# Patient Record
Sex: Female | Born: 1975
Health system: Southern US, Community
[De-identification: ages and names within clinical notes are randomized; demographics above are authoritative.]

## PROBLEM LIST (undated history)

## (undated) ENCOUNTER — Inpatient Hospital Stay (HOSPITAL_COMMUNITY): Payer: Self-pay

## (undated) DIAGNOSIS — O10919 Unspecified pre-existing hypertension complicating pregnancy, unspecified trimester: Secondary | ICD-10-CM

## (undated) DIAGNOSIS — M1990 Unspecified osteoarthritis, unspecified site: Secondary | ICD-10-CM

## (undated) DIAGNOSIS — I10 Essential (primary) hypertension: Secondary | ICD-10-CM

## (undated) DIAGNOSIS — G510 Bell's palsy: Secondary | ICD-10-CM

## (undated) DIAGNOSIS — E282 Polycystic ovarian syndrome: Secondary | ICD-10-CM

## (undated) DIAGNOSIS — A048 Other specified bacterial intestinal infections: Secondary | ICD-10-CM

## (undated) DIAGNOSIS — K429 Umbilical hernia without obstruction or gangrene: Secondary | ICD-10-CM

## (undated) HISTORY — DX: Other specified bacterial intestinal infections: A04.8

## (undated) HISTORY — DX: Polycystic ovarian syndrome: E28.2

## (undated) HISTORY — PX: TONSILLECTOMY AND ADENOIDECTOMY: SUR1326

## (undated) HISTORY — PX: CHOLECYSTECTOMY: SHX55

## (undated) HISTORY — DX: Unspecified pre-existing hypertension complicating pregnancy, unspecified trimester: O10.919

## (undated) HISTORY — DX: Unspecified osteoarthritis, unspecified site: M19.90

## (undated) HISTORY — DX: Essential (primary) hypertension: I10

## (undated) HISTORY — DX: Umbilical hernia without obstruction or gangrene: K42.9

---
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
## 1997-09-05 ENCOUNTER — Emergency Department (HOSPITAL_COMMUNITY): Admission: EM | Admit: 1997-09-05 | Discharge: 1997-09-05 | Payer: Self-pay | Admitting: Emergency Medicine

## 1998-01-13 ENCOUNTER — Emergency Department (HOSPITAL_COMMUNITY): Admission: EM | Admit: 1998-01-13 | Discharge: 1998-01-13 | Payer: Self-pay | Admitting: Emergency Medicine

## 1998-06-26 ENCOUNTER — Other Ambulatory Visit: Admission: RE | Admit: 1998-06-26 | Discharge: 1998-06-26 | Payer: Self-pay | Admitting: Obstetrics

## 1998-06-28 ENCOUNTER — Other Ambulatory Visit: Admission: RE | Admit: 1998-06-28 | Discharge: 1998-06-28 | Payer: Self-pay | Admitting: Obstetrics

## 1998-07-11 ENCOUNTER — Emergency Department (HOSPITAL_COMMUNITY): Admission: EM | Admit: 1998-07-11 | Discharge: 1998-07-11 | Payer: Self-pay | Admitting: Emergency Medicine

## 1998-07-11 ENCOUNTER — Encounter: Payer: Self-pay | Admitting: Emergency Medicine

## 1999-04-19 ENCOUNTER — Other Ambulatory Visit: Admission: RE | Admit: 1999-04-19 | Discharge: 1999-04-19 | Payer: Self-pay | Admitting: Obstetrics

## 1999-04-19 ENCOUNTER — Encounter (INDEPENDENT_AMBULATORY_CARE_PROVIDER_SITE_OTHER): Payer: Self-pay | Admitting: Specialist

## 1999-06-06 ENCOUNTER — Encounter: Payer: Self-pay | Admitting: Emergency Medicine

## 1999-06-06 ENCOUNTER — Emergency Department (HOSPITAL_COMMUNITY): Admission: EM | Admit: 1999-06-06 | Discharge: 1999-06-06 | Payer: Self-pay | Admitting: Emergency Medicine

## 2000-08-06 ENCOUNTER — Emergency Department (HOSPITAL_COMMUNITY): Admission: EM | Admit: 2000-08-06 | Discharge: 2000-08-06 | Payer: Self-pay | Admitting: Emergency Medicine

## 2000-08-06 ENCOUNTER — Encounter: Payer: Self-pay | Admitting: Emergency Medicine

## 2001-03-13 ENCOUNTER — Emergency Department (HOSPITAL_COMMUNITY): Admission: EM | Admit: 2001-03-13 | Discharge: 2001-03-13 | Payer: Self-pay | Admitting: Emergency Medicine

## 2002-12-15 ENCOUNTER — Emergency Department (HOSPITAL_COMMUNITY): Admission: EM | Admit: 2002-12-15 | Discharge: 2002-12-15 | Payer: Self-pay | Admitting: Emergency Medicine

## 2003-01-27 ENCOUNTER — Emergency Department (HOSPITAL_COMMUNITY): Admission: EM | Admit: 2003-01-27 | Discharge: 2003-01-27 | Payer: Self-pay | Admitting: Emergency Medicine

## 2003-04-01 ENCOUNTER — Ambulatory Visit (HOSPITAL_COMMUNITY): Admission: RE | Admit: 2003-04-01 | Discharge: 2003-04-01 | Payer: Self-pay | Admitting: *Deleted

## 2003-04-23 ENCOUNTER — Ambulatory Visit (HOSPITAL_COMMUNITY): Admission: RE | Admit: 2003-04-23 | Discharge: 2003-04-23 | Payer: Self-pay | Admitting: *Deleted

## 2003-06-04 ENCOUNTER — Inpatient Hospital Stay (HOSPITAL_COMMUNITY): Admission: AD | Admit: 2003-06-04 | Discharge: 2003-06-05 | Payer: Self-pay | Admitting: *Deleted

## 2003-07-03 ENCOUNTER — Observation Stay (HOSPITAL_COMMUNITY): Admission: AD | Admit: 2003-07-03 | Discharge: 2003-07-04 | Payer: Self-pay | Admitting: Family Medicine

## 2003-08-09 ENCOUNTER — Inpatient Hospital Stay (HOSPITAL_COMMUNITY): Admission: AD | Admit: 2003-08-09 | Discharge: 2003-08-09 | Payer: Self-pay | Admitting: *Deleted

## 2003-08-13 ENCOUNTER — Inpatient Hospital Stay (HOSPITAL_COMMUNITY): Admission: AD | Admit: 2003-08-13 | Discharge: 2003-08-13 | Payer: Self-pay | Admitting: Obstetrics and Gynecology

## 2003-08-24 ENCOUNTER — Inpatient Hospital Stay (HOSPITAL_COMMUNITY): Admission: AD | Admit: 2003-08-24 | Discharge: 2003-08-24 | Payer: Self-pay | Admitting: Family Medicine

## 2003-09-01 ENCOUNTER — Inpatient Hospital Stay (HOSPITAL_COMMUNITY): Admission: AD | Admit: 2003-09-01 | Discharge: 2003-09-01 | Payer: Self-pay | Admitting: Obstetrics & Gynecology

## 2003-09-04 ENCOUNTER — Encounter: Admission: RE | Admit: 2003-09-04 | Discharge: 2003-09-04 | Payer: Self-pay | Admitting: *Deleted

## 2003-09-05 ENCOUNTER — Encounter: Admission: RE | Admit: 2003-09-05 | Discharge: 2003-09-05 | Payer: Self-pay | Admitting: *Deleted

## 2003-09-07 ENCOUNTER — Inpatient Hospital Stay (HOSPITAL_COMMUNITY): Admission: AD | Admit: 2003-09-07 | Discharge: 2003-09-07 | Payer: Self-pay | Admitting: Family Medicine

## 2003-09-08 ENCOUNTER — Inpatient Hospital Stay (HOSPITAL_COMMUNITY): Admission: AD | Admit: 2003-09-08 | Discharge: 2003-09-08 | Payer: Self-pay | Admitting: Obstetrics & Gynecology

## 2003-09-11 ENCOUNTER — Inpatient Hospital Stay (HOSPITAL_COMMUNITY): Admission: AD | Admit: 2003-09-11 | Discharge: 2003-09-13 | Payer: Self-pay | Admitting: Obstetrics and Gynecology

## 2004-12-10 ENCOUNTER — Ambulatory Visit: Payer: Self-pay | Admitting: Nurse Practitioner

## 2004-12-14 ENCOUNTER — Ambulatory Visit: Payer: Self-pay | Admitting: *Deleted

## 2004-12-21 ENCOUNTER — Ambulatory Visit: Payer: Self-pay | Admitting: Nurse Practitioner

## 2005-01-07 ENCOUNTER — Ambulatory Visit: Payer: Self-pay | Admitting: Nurse Practitioner

## 2005-09-19 ENCOUNTER — Ambulatory Visit: Payer: Self-pay | Admitting: Nurse Practitioner

## 2005-10-24 ENCOUNTER — Ambulatory Visit: Payer: Self-pay | Admitting: *Deleted

## 2006-12-30 ENCOUNTER — Emergency Department (HOSPITAL_COMMUNITY): Admission: EM | Admit: 2006-12-30 | Discharge: 2006-12-30 | Payer: Self-pay | Admitting: Emergency Medicine

## 2007-03-22 ENCOUNTER — Emergency Department (HOSPITAL_COMMUNITY): Admission: EM | Admit: 2007-03-22 | Discharge: 2007-03-22 | Payer: Self-pay | Admitting: Emergency Medicine

## 2007-05-11 ENCOUNTER — Ambulatory Visit: Payer: Self-pay | Admitting: Nurse Practitioner

## 2007-05-14 ENCOUNTER — Ambulatory Visit (HOSPITAL_COMMUNITY): Admission: RE | Admit: 2007-05-14 | Discharge: 2007-05-14 | Payer: Self-pay | Admitting: Nurse Practitioner

## 2007-07-05 ENCOUNTER — Ambulatory Visit: Payer: Self-pay | Admitting: Family Medicine

## 2007-07-12 ENCOUNTER — Ambulatory Visit (HOSPITAL_COMMUNITY): Admission: RE | Admit: 2007-07-12 | Discharge: 2007-07-12 | Payer: Self-pay | Admitting: Family Medicine

## 2007-08-09 ENCOUNTER — Ambulatory Visit: Payer: Self-pay | Admitting: Family Medicine

## 2007-08-31 ENCOUNTER — Ambulatory Visit: Payer: Self-pay | Admitting: Internal Medicine

## 2007-09-03 ENCOUNTER — Ambulatory Visit: Payer: Self-pay | Admitting: Internal Medicine

## 2008-02-09 ENCOUNTER — Emergency Department (HOSPITAL_COMMUNITY): Admission: EM | Admit: 2008-02-09 | Discharge: 2008-02-09 | Payer: Self-pay | Admitting: Emergency Medicine

## 2008-09-18 ENCOUNTER — Emergency Department (HOSPITAL_COMMUNITY): Admission: EM | Admit: 2008-09-18 | Discharge: 2008-09-18 | Payer: Self-pay | Admitting: Emergency Medicine

## 2008-09-22 ENCOUNTER — Emergency Department (HOSPITAL_COMMUNITY): Admission: EM | Admit: 2008-09-22 | Discharge: 2008-09-22 | Payer: Self-pay | Admitting: Emergency Medicine

## 2008-09-24 ENCOUNTER — Emergency Department (HOSPITAL_COMMUNITY): Admission: EM | Admit: 2008-09-24 | Discharge: 2008-09-24 | Payer: Self-pay | Admitting: Family Medicine

## 2008-11-15 ENCOUNTER — Emergency Department (HOSPITAL_COMMUNITY): Admission: EM | Admit: 2008-11-15 | Discharge: 2008-11-15 | Payer: Self-pay | Admitting: Family Medicine

## 2008-11-23 ENCOUNTER — Emergency Department (HOSPITAL_COMMUNITY): Admission: EM | Admit: 2008-11-23 | Discharge: 2008-11-23 | Payer: Self-pay | Admitting: Emergency Medicine

## 2009-04-09 ENCOUNTER — Emergency Department (HOSPITAL_COMMUNITY): Admission: EM | Admit: 2009-04-09 | Discharge: 2009-04-09 | Payer: Self-pay | Admitting: Family Medicine

## 2010-03-12 ENCOUNTER — Inpatient Hospital Stay (HOSPITAL_COMMUNITY): Admission: AD | Admit: 2010-03-12 | Discharge: 2010-03-13 | Payer: Self-pay | Admitting: Obstetrics and Gynecology

## 2010-03-31 ENCOUNTER — Encounter
Admission: RE | Admit: 2010-03-31 | Discharge: 2010-06-08 | Payer: Self-pay | Source: Home / Self Care | Attending: Obstetrics and Gynecology | Admitting: Obstetrics and Gynecology

## 2010-04-19 ENCOUNTER — Emergency Department (HOSPITAL_COMMUNITY)
Admission: EM | Admit: 2010-04-19 | Discharge: 2010-04-19 | Payer: Self-pay | Source: Home / Self Care | Admitting: Family Medicine

## 2010-04-19 ENCOUNTER — Inpatient Hospital Stay (HOSPITAL_COMMUNITY)
Admission: AD | Admit: 2010-04-19 | Discharge: 2010-04-20 | Payer: Self-pay | Source: Home / Self Care | Attending: Obstetrics and Gynecology | Admitting: Obstetrics and Gynecology

## 2010-07-13 ENCOUNTER — Inpatient Hospital Stay (HOSPITAL_COMMUNITY)
Admission: AD | Admit: 2010-07-13 | Discharge: 2010-07-13 | Disposition: A | Discharge status: Home / Self Care | Payer: Medicaid Other | Source: Ambulatory Visit | Attending: Obstetrics and Gynecology | Admitting: Obstetrics and Gynecology

## 2010-07-13 DIAGNOSIS — O4700 False labor before 37 completed weeks of gestation, unspecified trimester: Secondary | ICD-10-CM | POA: Insufficient documentation

## 2010-07-13 LAB — CBC
HCT: 35.6 % — ABNORMAL LOW (ref 36.0–46.0)
Hemoglobin: 11.3 g/dL — ABNORMAL LOW (ref 12.0–15.0)
MCH: 27.9 pg (ref 26.0–34.0)
MCHC: 31.7 g/dL (ref 30.0–36.0)
MCV: 87.9 fL (ref 78.0–100.0)

## 2010-07-13 LAB — URIC ACID: Uric Acid, Serum: 4.3 mg/dL (ref 2.4–7.0)

## 2010-07-13 LAB — LACTATE DEHYDROGENASE: LDH: 105 U/L (ref 94–250)

## 2010-07-13 LAB — COMPREHENSIVE METABOLIC PANEL
ALT: 19 U/L (ref 0–35)
BUN: 7 mg/dL (ref 6–23)
CO2: 22 mEq/L (ref 19–32)
Calcium: 8.8 mg/dL (ref 8.4–10.5)
GFR calc non Af Amer: >60 mL/min (ref >60)
Glucose, Bld: 88 mg/dL (ref 70–99)
Sodium: 132 mEq/L — ABNORMAL LOW (ref 135–145)

## 2010-07-15 LAB — STREP B DNA PROBE

## 2010-07-17 ENCOUNTER — Inpatient Hospital Stay (HOSPITAL_COMMUNITY)
Admission: AD | Admit: 2010-07-17 | Discharge: 2010-07-17 | Disposition: A | Discharge status: Home / Self Care | Payer: Medicaid Other | Source: Ambulatory Visit | Attending: Obstetrics and Gynecology | Admitting: Obstetrics and Gynecology

## 2010-07-17 DIAGNOSIS — O4700 False labor before 37 completed weeks of gestation, unspecified trimester: Secondary | ICD-10-CM | POA: Insufficient documentation

## 2010-07-17 LAB — URINALYSIS, ROUTINE W REFLEX MICROSCOPIC
Glucose, UA: NEGATIVE mg/dL
Protein, ur: NEGATIVE mg/dL
Specific Gravity, Urine: 1.025 (ref 1.005–1.030)

## 2010-07-17 LAB — URINE MICROSCOPIC-ADD ON

## 2010-07-19 ENCOUNTER — Inpatient Hospital Stay (HOSPITAL_COMMUNITY)
Admission: AD | Admit: 2010-07-19 | Discharge: 2010-07-21 | DRG: 781 | Disposition: A | Discharge status: Home / Self Care | Payer: Medicaid Other | Source: Ambulatory Visit | Attending: Obstetrics and Gynecology | Admitting: Obstetrics and Gynecology

## 2010-07-19 DIAGNOSIS — O4700 False labor before 37 completed weeks of gestation, unspecified trimester: Secondary | ICD-10-CM | POA: Diagnosis present

## 2010-07-19 DIAGNOSIS — O139 Gestational [pregnancy-induced] hypertension without significant proteinuria, unspecified trimester: Principal | ICD-10-CM | POA: Diagnosis present

## 2010-07-19 DIAGNOSIS — O99810 Abnormal glucose complicating pregnancy: Secondary | ICD-10-CM | POA: Diagnosis present

## 2010-07-19 LAB — CBC
HCT: 34.2 % — ABNORMAL LOW (ref 36.0–46.0)
Hemoglobin: 10.8 g/dL — ABNORMAL LOW (ref 12.0–15.0)
MCH: 27.8 pg (ref 26.0–34.0)
MCH: 29.5 pg (ref 26.0–34.0)
MCHC: 33.1 g/dL (ref 30.0–36.0)
MCV: 87.9 fL (ref 78.0–100.0)
RBC: 3.88 MIL/uL (ref 3.87–5.11)
RDW: 15.4 % (ref 11.5–15.5)

## 2010-07-19 LAB — GLUCOSE, CAPILLARY
Glucose-Capillary: 156 mg/dL — ABNORMAL HIGH (ref 70–99)
Glucose-Capillary: 63 mg/dL — ABNORMAL LOW (ref 70–99)
Glucose-Capillary: 90 mg/dL (ref 70–99)

## 2010-07-19 LAB — COMPREHENSIVE METABOLIC PANEL
AST: 16 U/L (ref 0–37)
BUN: 4 mg/dL — ABNORMAL LOW (ref 6–23)
CO2: 22 mEq/L (ref 19–32)
Chloride: 104 mEq/L (ref 96–112)
Creatinine, Ser: 0.48 mg/dL (ref 0.4–1.2)
GFR calc Af Amer: >60 mL/min (ref >60)
GFR calc non Af Amer: >60 mL/min (ref >60)
Total Bilirubin: 0.5 mg/dL (ref 0.3–1.2)

## 2010-07-19 LAB — URINALYSIS, ROUTINE W REFLEX MICROSCOPIC
Bilirubin Urine: NEGATIVE
Ketones, ur: NEGATIVE mg/dL
Protein, ur: NEGATIVE mg/dL
Urobilinogen, UA: 0.2 mg/dL (ref 0.0–1.0)

## 2010-07-19 LAB — DIFFERENTIAL
Basophils Absolute: 0.1 10*3/uL (ref 0.0–0.1)
Basophils Relative: 0 % (ref 0–1)
Eosinophils Absolute: 0 10*3/uL (ref 0.0–0.7)
Eosinophils Relative: 0 % (ref 0–5)
Monocytes Absolute: 0.3 10*3/uL (ref 0.1–1.0)
Neutro Abs: 13.2 10*3/uL — ABNORMAL HIGH (ref 1.7–7.7)

## 2010-07-20 LAB — LACTATE DEHYDROGENASE
LDH: 86 U/L — ABNORMAL LOW (ref 94–250)
LDH: 95 U/L (ref 94–250)

## 2010-07-20 LAB — COMPREHENSIVE METABOLIC PANEL
ALT: 16 U/L (ref 0–35)
ALT: 19 U/L (ref 0–35)
AST: 21 U/L (ref 0–37)
BUN: 5 mg/dL — ABNORMAL LOW (ref 6–23)
CO2: 23 mEq/L (ref 19–32)
CO2: 23 mEq/L (ref 19–32)
Calcium: 8.5 mg/dL (ref 8.4–10.5)
Calcium: 8.8 mg/dL (ref 8.4–10.5)
Creatinine, Ser: 0.44 mg/dL (ref 0.4–1.2)
Creatinine, Ser: 0.49 mg/dL (ref 0.4–1.2)
GFR calc Af Amer: >60 mL/min (ref >60)
GFR calc non Af Amer: >60 mL/min (ref >60)
GFR calc non Af Amer: >60 mL/min (ref >60)
Glucose, Bld: 59 mg/dL — ABNORMAL LOW (ref 70–99)
Sodium: 135 mEq/L (ref 135–145)
Sodium: 133 mEq/L — ABNORMAL LOW (ref 135–145)
Total Protein: 5.9 g/dL — ABNORMAL LOW (ref 6.0–8.3)
Total Protein: 6.9 g/dL (ref 6.0–8.3)

## 2010-07-20 LAB — CBC
HCT: 33.4 % — ABNORMAL LOW (ref 36.0–46.0)
MCH: 27.2 pg (ref 26.0–34.0)
MCH: 29.8 pg (ref 26.0–34.0)
MCHC: 30.8 g/dL (ref 30.0–36.0)
MCHC: 33.6 g/dL (ref 30.0–36.0)
Platelets: 342 10*3/uL (ref 150–400)
RDW: 15.6 % — ABNORMAL HIGH (ref 11.5–15.5)
RDW: 14.6 % (ref 11.5–15.5)

## 2010-07-20 LAB — URIC ACID
Uric Acid, Serum: 5 mg/dL (ref 2.4–7.0)
Uric Acid, Serum: 3.8 mg/dL (ref 2.4–7.0)

## 2010-07-20 LAB — URINALYSIS, ROUTINE W REFLEX MICROSCOPIC
Bilirubin Urine: NEGATIVE
Ketones, ur: 15 mg/dL — AB
Nitrite: NEGATIVE
Specific Gravity, Urine: >1.03 — ABNORMAL HIGH (ref 1.005–1.030)
Urobilinogen, UA: 0.2 mg/dL (ref 0.0–1.0)
pH: 6 (ref 5.0–8.0)

## 2010-07-20 LAB — GLUCOSE, CAPILLARY
Glucose-Capillary: 58 mg/dL — ABNORMAL LOW (ref 70–99)
Glucose-Capillary: 100 mg/dL — ABNORMAL HIGH (ref 70–99)
Glucose-Capillary: 124 mg/dL — ABNORMAL HIGH (ref 70–99)

## 2010-07-21 LAB — CREATININE CLEARANCE, URINE, 24 HOUR
Collection Interval-CRCL: 24 hours
Creatinine Clearance: 270 mL/min — ABNORMAL HIGH (ref 75–115)
Creatinine, 24H Ur: 1711 mg/d (ref 700–1800)
Creatinine, Urine: 79.6 mg/dL

## 2010-07-21 LAB — GLUCOSE, CAPILLARY: Glucose-Capillary: 69 mg/dL — ABNORMAL LOW (ref 70–99)

## 2010-07-23 ENCOUNTER — Inpatient Hospital Stay (HOSPITAL_COMMUNITY)
Admission: AD | Admit: 2010-07-23 | Discharge: 2010-07-23 | Disposition: A | Discharge status: Home / Self Care | Payer: Medicaid Other | Source: Ambulatory Visit | Attending: Obstetrics and Gynecology | Admitting: Obstetrics and Gynecology

## 2010-07-23 DIAGNOSIS — O99810 Abnormal glucose complicating pregnancy: Secondary | ICD-10-CM | POA: Insufficient documentation

## 2010-07-23 DIAGNOSIS — O10019 Pre-existing essential hypertension complicating pregnancy, unspecified trimester: Secondary | ICD-10-CM | POA: Insufficient documentation

## 2010-07-23 DIAGNOSIS — O4700 False labor before 37 completed weeks of gestation, unspecified trimester: Secondary | ICD-10-CM | POA: Insufficient documentation

## 2010-07-27 ENCOUNTER — Inpatient Hospital Stay (HOSPITAL_COMMUNITY)
Admission: AD | Admit: 2010-07-27 | Discharge: 2010-07-27 | Disposition: A | Discharge status: Home / Self Care | Payer: Medicaid Other | Source: Ambulatory Visit | Attending: Obstetrics and Gynecology | Admitting: Obstetrics and Gynecology

## 2010-07-27 DIAGNOSIS — O139 Gestational [pregnancy-induced] hypertension without significant proteinuria, unspecified trimester: Secondary | ICD-10-CM | POA: Insufficient documentation

## 2010-07-27 LAB — CBC
HCT: 35.6 % — ABNORMAL LOW (ref 36.0–46.0)
MCH: 27.6 pg (ref 26.0–34.0)
MCV: 89.2 fL (ref 78.0–100.0)
RBC: 3.99 MIL/uL (ref 3.87–5.11)
WBC: 13.1 10*3/uL — ABNORMAL HIGH (ref 4.0–10.5)

## 2010-07-27 LAB — COMPREHENSIVE METABOLIC PANEL
Albumin: 2.8 g/dL — ABNORMAL LOW (ref 3.5–5.2)
Alkaline Phosphatase: 117 U/L (ref 39–117)
BUN: 7 mg/dL (ref 6–23)
CO2: 21 mEq/L (ref 19–32)
Chloride: 107 mEq/L (ref 96–112)
Creatinine, Ser: 0.51 mg/dL (ref 0.4–1.2)
GFR calc non Af Amer: >60 mL/min (ref >60)
Glucose, Bld: 74 mg/dL (ref 70–99)
Potassium: 3.8 mEq/L (ref 3.5–5.1)
Total Bilirubin: 0.4 mg/dL (ref 0.3–1.2)

## 2010-07-27 LAB — URIC ACID: Uric Acid, Serum: 5 mg/dL (ref 2.4–7.0)

## 2010-08-10 ENCOUNTER — Inpatient Hospital Stay (HOSPITAL_COMMUNITY)
Admission: AD | Admit: 2010-08-10 | Discharge: 2010-08-10 | Disposition: A | Discharge status: Home / Self Care | Payer: Medicaid Other | Source: Ambulatory Visit | Attending: Obstetrics and Gynecology | Admitting: Obstetrics and Gynecology

## 2010-08-10 LAB — POCT PREGNANCY, URINE: Preg Test, Ur: NEGATIVE

## 2010-08-15 LAB — DIFFERENTIAL
Basophils Absolute: 0 10*3/uL (ref 0.0–0.1)
Basophils Relative: 0 % (ref 0–1)
Eosinophils Absolute: 0 10*3/uL (ref 0.0–0.7)
Eosinophils Relative: 0 % (ref 0–5)
Lymphs Abs: 0.6 10*3/uL — ABNORMAL LOW (ref 0.7–4.0)
Neutrophils Relative %: 92 % — ABNORMAL HIGH (ref 43–77)

## 2010-08-15 LAB — GLUCOSE, CAPILLARY: Glucose-Capillary: 82 mg/dL (ref 70–99)

## 2010-08-15 LAB — BASIC METABOLIC PANEL
BUN: 10 mg/dL (ref 6–23)
Calcium: 8.8 mg/dL (ref 8.4–10.5)
Chloride: 103 mEq/L (ref 96–112)
Creatinine, Ser: 0.71 mg/dL (ref 0.4–1.2)

## 2010-08-15 LAB — CBC
MCV: 90.7 fL (ref 78.0–100.0)
Platelets: 311 10*3/uL (ref 150–400)
WBC: 14.6 10*3/uL — ABNORMAL HIGH (ref 4.0–10.5)

## 2010-08-15 LAB — PREGNANCY, URINE: Preg Test, Ur: NEGATIVE

## 2010-08-16 ENCOUNTER — Inpatient Hospital Stay (HOSPITAL_COMMUNITY)
Admission: AD | Admit: 2010-08-16 | Discharge: 2010-08-18 | DRG: 774 | Disposition: A | Discharge status: Home / Self Care | Payer: Medicaid Other | Source: Ambulatory Visit | Attending: Obstetrics and Gynecology | Admitting: Obstetrics and Gynecology

## 2010-08-16 DIAGNOSIS — O1002 Pre-existing essential hypertension complicating childbirth: Secondary | ICD-10-CM | POA: Diagnosis present

## 2010-08-16 DIAGNOSIS — O99814 Abnormal glucose complicating childbirth: Principal | ICD-10-CM | POA: Diagnosis present

## 2010-08-16 LAB — CBC
Hemoglobin: 11.7 g/dL — ABNORMAL LOW (ref 12.0–15.0)
MCH: 28.3 pg (ref 26.0–34.0)
MCHC: 31.8 g/dL (ref 30.0–36.0)
Platelets: 330 10*3/uL (ref 150–400)
RDW: 15.9 % — ABNORMAL HIGH (ref 11.5–15.5)

## 2010-08-16 LAB — COMPREHENSIVE METABOLIC PANEL
ALT: 21 U/L (ref 0–35)
AST: 33 U/L (ref 0–37)
CO2: 21 mEq/L (ref 19–32)
Calcium: 8.5 mg/dL (ref 8.4–10.5)
Creatinine, Ser: 0.47 mg/dL (ref 0.4–1.2)
GFR calc Af Amer: >60 mL/min (ref >60)
GFR calc non Af Amer: >60 mL/min (ref >60)
Sodium: 129 mEq/L — ABNORMAL LOW (ref 135–145)
Total Protein: 6.2 g/dL (ref 6.0–8.3)

## 2010-08-16 LAB — RPR: RPR Ser Ql: REACTIVE — AB

## 2010-08-16 LAB — RPR TITER: RPR Titer: 1:1 {titer}

## 2010-08-16 LAB — GLUCOSE, CAPILLARY: Glucose-Capillary: 62 mg/dL — ABNORMAL LOW (ref 70–99)

## 2010-08-17 LAB — CULTURE, ROUTINE-ABSCESS

## 2010-08-17 LAB — CBC
MCH: 28 pg (ref 26.0–34.0)
MCHC: 31.1 g/dL (ref 30.0–36.0)
Platelets: 276 10*3/uL (ref 150–400)
RBC: 3.72 MIL/uL — ABNORMAL LOW (ref 3.87–5.11)

## 2010-08-17 LAB — GLUCOSE, CAPILLARY: Glucose-Capillary: 81 mg/dL (ref 70–99)

## 2010-08-17 LAB — T.PALLIDUM AB, IGG: T pallidum Antibodies (TP-PA): 0.08 S/CO (ref <0.90)

## 2010-08-22 ENCOUNTER — Inpatient Hospital Stay (HOSPITAL_COMMUNITY): Admission: AD | Admit: 2010-08-22 | Payer: Self-pay | Source: Home / Self Care | Admitting: Obstetrics and Gynecology

## 2010-10-22 ENCOUNTER — Observation Stay (HOSPITAL_COMMUNITY)
Admission: EM | Admit: 2010-10-22 | Discharge: 2010-10-22 | Disposition: A | Discharge status: Home / Self Care | Payer: Medicaid Other | Source: Ambulatory Visit | Attending: Emergency Medicine | Admitting: Emergency Medicine

## 2010-10-22 ENCOUNTER — Emergency Department (HOSPITAL_COMMUNITY): Payer: Medicaid Other

## 2010-10-22 DIAGNOSIS — R079 Chest pain, unspecified: Principal | ICD-10-CM | POA: Insufficient documentation

## 2010-10-22 DIAGNOSIS — R072 Precordial pain: Secondary | ICD-10-CM

## 2010-10-22 DIAGNOSIS — R51 Headache: Secondary | ICD-10-CM | POA: Insufficient documentation

## 2010-10-22 DIAGNOSIS — R110 Nausea: Secondary | ICD-10-CM | POA: Insufficient documentation

## 2010-10-22 LAB — DIFFERENTIAL
Basophils Absolute: 0 10*3/uL (ref 0.0–0.1)
Lymphocytes Relative: 19 % (ref 12–46)
Neutro Abs: 10 10*3/uL — ABNORMAL HIGH (ref 1.7–7.7)

## 2010-10-22 LAB — POCT I-STAT, CHEM 8
Chloride: 107 mEq/L (ref 96–112)
HCT: 36 % (ref 36.0–46.0)
Hemoglobin: 12.2 g/dL (ref 12.0–15.0)
Potassium: 3.7 mEq/L (ref 3.5–5.1)
Sodium: 141 mEq/L (ref 135–145)

## 2010-10-22 LAB — CBC
HCT: 35.2 % — ABNORMAL LOW (ref 36.0–46.0)
Hemoglobin: 11.3 g/dL — ABNORMAL LOW (ref 12.0–15.0)
RBC: 4.15 MIL/uL (ref 3.87–5.11)
RDW: 14.5 % (ref 11.5–15.5)
WBC: 13.8 10*3/uL — ABNORMAL HIGH (ref 4.0–10.5)

## 2010-10-22 LAB — CK TOTAL AND CKMB (NOT AT ARMC)
CK, MB: 0.9 ng/mL (ref 0.3–4.0)
Relative Index: INVALID (ref 0.0–2.5)
Total CK: 50 U/L (ref 7–177)
Total CK: 44 U/L (ref 7–177)

## 2010-10-22 LAB — TROPONIN I
Troponin I: <0.3 ng/mL (ref <0.30)
Troponin I: <0.3 ng/mL (ref <0.30)

## 2010-10-26 ENCOUNTER — Emergency Department (HOSPITAL_COMMUNITY): Payer: Medicaid Other

## 2010-10-26 ENCOUNTER — Emergency Department (HOSPITAL_COMMUNITY)
Admission: EM | Admit: 2010-10-26 | Discharge: 2010-10-26 | Disposition: A | Discharge status: Home / Self Care | Payer: Medicaid Other | Attending: Emergency Medicine | Admitting: Emergency Medicine

## 2010-10-26 DIAGNOSIS — R29810 Facial weakness: Secondary | ICD-10-CM | POA: Insufficient documentation

## 2010-10-26 DIAGNOSIS — H9319 Tinnitus, unspecified ear: Secondary | ICD-10-CM | POA: Insufficient documentation

## 2010-10-26 DIAGNOSIS — G510 Bell's palsy: Secondary | ICD-10-CM | POA: Insufficient documentation

## 2010-10-26 DIAGNOSIS — J45909 Unspecified asthma, uncomplicated: Secondary | ICD-10-CM | POA: Insufficient documentation

## 2011-05-01 ENCOUNTER — Emergency Department (HOSPITAL_COMMUNITY)
Admission: EM | Admit: 2011-05-01 | Discharge: 2011-05-01 | Disposition: A | Discharge status: Home / Self Care | Payer: Medicaid Other | Attending: Emergency Medicine | Admitting: Emergency Medicine

## 2011-05-01 ENCOUNTER — Emergency Department (HOSPITAL_COMMUNITY): Payer: Medicaid Other

## 2011-05-01 DIAGNOSIS — R5381 Other malaise: Secondary | ICD-10-CM | POA: Insufficient documentation

## 2011-05-01 DIAGNOSIS — R0789 Other chest pain: Secondary | ICD-10-CM

## 2011-05-01 DIAGNOSIS — R079 Chest pain, unspecified: Secondary | ICD-10-CM | POA: Insufficient documentation

## 2011-05-01 DIAGNOSIS — R5383 Other fatigue: Secondary | ICD-10-CM | POA: Insufficient documentation

## 2011-05-01 DIAGNOSIS — M549 Dorsalgia, unspecified: Secondary | ICD-10-CM | POA: Insufficient documentation

## 2011-05-01 LAB — D-DIMER, QUANTITATIVE: D-Dimer, Quant: 0.49 ug/mL-FEU — ABNORMAL HIGH (ref 0.00–0.48)

## 2011-05-01 LAB — POCT I-STAT, CHEM 8
BUN: 8 mg/dL (ref 6–23)
Creatinine, Ser: 0.6 mg/dL (ref 0.50–1.10)
Potassium: 3.5 mEq/L (ref 3.5–5.1)
Sodium: 143 mEq/L (ref 135–145)

## 2011-05-01 LAB — CBC
Platelets: 377 10*3/uL (ref 150–400)
RBC: 4.5 MIL/uL (ref 3.87–5.11)
RDW: 14.2 % (ref 11.5–15.5)
WBC: 12.2 10*3/uL — ABNORMAL HIGH (ref 4.0–10.5)

## 2011-05-01 LAB — DIFFERENTIAL
Basophils Absolute: 0 10*3/uL (ref 0.0–0.1)
Lymphocytes Relative: 22 % (ref 12–46)
Lymphs Abs: 2.6 10*3/uL (ref 0.7–4.0)
Neutro Abs: 8.5 10*3/uL — ABNORMAL HIGH (ref 1.7–7.7)
Neutrophils Relative %: 70 % (ref 43–77)

## 2011-05-01 LAB — TROPONIN I: Troponin I: <0.3 ng/mL (ref <0.30)

## 2011-05-01 MED ORDER — MORPHINE SULFATE 4 MG/ML IJ SOLN
6.0000 mg | Freq: Once | INTRAMUSCULAR | Status: AC
  Administered 2011-05-01: 6 mg via INTRAVENOUS
  Filled 2011-05-01: qty 2

## 2011-05-01 MED ORDER — HYDROCODONE-ACETAMINOPHEN 5-325 MG PO TABS: 1.0000 | ORAL_TABLET | ORAL | Status: AC | PRN

## 2011-05-01 MED ORDER — ONDANSETRON HCL 4 MG/2ML IJ SOLN
4.0000 mg | Freq: Once | INTRAMUSCULAR | Status: AC
  Administered 2011-05-01: 4 mg via INTRAVENOUS
  Filled 2011-05-01: qty 2

## 2011-05-01 NOTE — ED Notes (Signed)
WUJ:[id number]<JY> Expected date:05/01/11<BR> Expected time: 1:32 PM<BR> Means of arrival:Ambulance<BR> Comments:<BR> EMS 10 GC- 36 y/o female with sudden onset of back pain. Also complains of CP. She is Hypertensive. 12-lead unremarkable.

## 2011-05-01 NOTE — ED Provider Notes (Signed)
History     CSN: [id number]  Arrival date & time 05/01/11  1345   First MD Initiated Contact with Patient 05/01/11 1437      Chief Complaint  Patient presents with  . Back Pain    (Consider location/radiation/quality/duration/timing/severity/associated sxs/prior treatment) Patient is a 35 y.o. female presenting with back pain. The history is provided by the patient.  Back Pain  This is a new problem. The current episode started 1 to 2 hours ago. The problem occurs constantly. The problem has been gradually improving. The pain is present in the thoracic spine (She woke from sleeping this afternoon with intense sharp pain in bilateral upper back that radiated into chest.  No shortness of breath but pain was worse with movement and deep breathing.). The quality of the pain is described as stabbing. The pain is severe. Associated symptoms include chest pain. Pertinent negatives include no fever, no abdominal pain and no dysuria. Treatments tried: She was given nitro by EMS with minimal change in pain and morphine on arrival here with relief.     No past medical history on file.  No past surgical history on file.  No family history on file.  History  Substance Use Topics  . Smoking status: Not on file  . Smokeless tobacco: Not on file  . Alcohol Use: Not on file    OB History    No data available      Review of Systems  Constitutional: Negative for fever and chills.  HENT: Negative.  Negative for congestion.   Eyes: Negative.   Respiratory: Negative.  Negative for cough and shortness of breath.   Cardiovascular: Positive for chest pain.       See HPI.  Gastrointestinal: Negative.  Negative for nausea, vomiting and abdominal pain.  Genitourinary: Negative for dysuria.  Musculoskeletal: Positive for back pain.  Skin: Negative.   Neurological: Negative.     Allergies  Sulfa antibiotics  Home Medications   Current Outpatient Rx  Name Route Sig Dispense Refill  .  ACETAMINOPHEN 500 MG PO TABS Oral Take 500 mg by mouth every 6 (six) hours as needed. For headaches     . IBUPROFEN 200 MG PO TABS Oral Take 200 mg by mouth every 6 (six) hours as needed. For knee pains associated with arthritis       BP 151/87  Pulse 66  Temp(Src) 98 F (36.7 C) (Oral)  Resp 20  SpO2 100%  Physical Exam  Constitutional: She appears well-developed and well-nourished.  HENT:  Head: Normocephalic.  Neck: Normal range of motion. Neck supple.  Cardiovascular: Normal rate and regular rhythm.   Pulmonary/Chest: Effort normal and breath sounds normal. She exhibits no tenderness.  Abdominal: Soft. Bowel sounds are normal. There is no tenderness. There is no rebound and no guarding.  Musculoskeletal: Normal range of motion.       Back is unremarkable in appearance. No swelling. Nontender.   Neurological: She is alert. No cranial nerve deficit.  Skin: Skin is warm and dry. No rash noted.  Psychiatric: She has a normal mood and affect.    ED Course  Procedures (including critical care time)  Labs Reviewed  D-DIMER, QUANTITATIVE - Abnormal; Notable for the following:    D-Dimer, Quant 0.49 (*)    All other components within normal limits  CBC - Abnormal; Notable for the following:    WBC 12.2 (*)    All other components within normal limits  DIFFERENTIAL - Abnormal; Notable for the following:  Neutro Abs 8.5 (*)    All other components within normal limits  POCT I-STAT, CHEM 8 - Abnormal; Notable for the following:    Glucose, Bld 105 (*)    Calcium, Ion 1.09 (*)    All other components within normal limits  TROPONIN I  I-STAT, CHEM 8   Dg Chest 2 View  05/01/2011  *RADIOLOGY REPORT*  Clinical Data: Chest pain and weakness.  CHEST - 2 VIEW  Comparison: PA and lateral chest 10/22/2010.  Findings: Lungs are clear.  Heart size is normal.  No pneumothorax or effusion.  IMPRESSION: No acute disease.  Original Report Authenticated By: Bernadene Bell. Maricela Curet, M.D.      No diagnosis found.    MDM  She remains comfortable without additional medications needed. Lab studies are essentially normal without concerning findings. Chest x-ray is negative. VSS, with mildly elevated blood pressure.     Date: 05/01/2011  Rate: 58  Rhythm: sinus arrhythmia  QRS Axis: normal  Intervals: normal  ST/T Wave abnormalities: normal  Conduction Disutrbances:none  Narrative Interpretation:   Old EKG Reviewed: unchanged    Rodena Medin, PA 05/01/11 1828

## 2011-05-01 NOTE — ED Notes (Signed)
Per EMS pt woke up this morning and started to have pain in her back making it difficult for her to ambulate. Pt also had pain in chest. Hypertensive upon EMS arrival. bp at 180/112 per EMS. NSR on 12-lead. Hx of gestational hypertension.

## 2011-05-01 NOTE — ED Notes (Signed)
Pt discharged to home. DC instructions given with female family member at bedside. No concerns voiced. Prescription x 1 given for pain med. Left unit via wheelchair pushed by female family member.  Left in good condition.

## 2011-05-01 NOTE — ED Notes (Signed)
Pt alert and oriented x 4. Reports took a nap this afternoon and tried to wake up at around about 1220 and all of a sudden fell a sharp pain in mid to upper back. Rates pain at greater than 10 at onset.  Reports pain got worse with repositioning. Her daddy called EMS to bring her in to the hospital. She  Now rates pain at a 9/10. Dull and constant aching pain. Medicated with 324 mg asa and a nitroglycerine pill. Pt reports she suffers from asthma; but has taken nothing lately for same.

## 2011-05-01 NOTE — ED Provider Notes (Signed)
Medical screening examination/treatment/procedure(s) were performed by non-physician practitioner and as supervising physician I was immediately available for consultation/collaboration. Devoria Albe, MD, Armando Gang   Ward Givens, MD 05/01/11 [phone]

## 2011-05-31 ENCOUNTER — Emergency Department (HOSPITAL_COMMUNITY)
Admission: EM | Admit: 2011-05-31 | Discharge: 2011-05-31 | Disposition: A | Discharge status: Home / Self Care | Payer: Medicaid Other | Source: Home / Self Care | Attending: Emergency Medicine | Admitting: Emergency Medicine

## 2011-05-31 ENCOUNTER — Encounter (HOSPITAL_COMMUNITY): Payer: Self-pay

## 2011-05-31 ENCOUNTER — Emergency Department (INDEPENDENT_AMBULATORY_CARE_PROVIDER_SITE_OTHER): Payer: Medicaid Other

## 2011-05-31 DIAGNOSIS — M549 Dorsalgia, unspecified: Secondary | ICD-10-CM

## 2011-05-31 DIAGNOSIS — A048 Other specified bacterial intestinal infections: Secondary | ICD-10-CM

## 2011-05-31 DIAGNOSIS — R319 Hematuria, unspecified: Secondary | ICD-10-CM

## 2011-05-31 HISTORY — DX: Bell's palsy: G51.0

## 2011-05-31 LAB — POCT I-STAT, CHEM 8
Chloride: 100 mEq/L (ref 96–112)
Creatinine, Ser: 0.8 mg/dL (ref 0.50–1.10)
Glucose, Bld: 99 mg/dL (ref 70–99)
HCT: 49 % — ABNORMAL HIGH (ref 36.0–46.0)
Hemoglobin: 16.7 g/dL — ABNORMAL HIGH (ref 12.0–15.0)
Potassium: 4 mEq/L (ref 3.5–5.1)
Sodium: 141 mEq/L (ref 135–145)

## 2011-05-31 LAB — POCT URINALYSIS DIP (DEVICE)
Glucose, UA: NEGATIVE mg/dL
Nitrite: NEGATIVE
Specific Gravity, Urine: 1.015 (ref 1.005–1.030)
Urobilinogen, UA: 0.2 mg/dL (ref 0.0–1.0)

## 2011-05-31 LAB — URINE CULTURE
Culture  Setup Time: 201301221453
Special Requests: NORMAL

## 2011-05-31 LAB — POCT H PYLORI SCREEN: H. PYLORI SCREEN, POC: POSITIVE — AB

## 2011-05-31 MED ORDER — TRAMADOL HCL 50 MG PO TABS: 100.0000 mg | ORAL_TABLET | Freq: Three times a day (TID) | ORAL | Status: AC | PRN

## 2011-05-31 MED ORDER — DOXYCYCLINE HYCLATE 100 MG PO TABS: 100.0000 mg | ORAL_TABLET | Freq: Two times a day (BID) | ORAL | Status: AC

## 2011-05-31 MED ORDER — OMEPRAZOLE 20 MG PO CPDR: 20.0000 mg | DELAYED_RELEASE_CAPSULE | Freq: Two times a day (BID) | ORAL | Status: DC

## 2011-05-31 MED ORDER — ONDANSETRON 8 MG PO TBDP: 8.0000 mg | ORAL_TABLET | Freq: Three times a day (TID) | ORAL | Status: AC | PRN

## 2011-05-31 MED ORDER — METRONIDAZOLE 500 MG PO TABS: 500.0000 mg | ORAL_TABLET | Freq: Four times a day (QID) | ORAL | Status: AC

## 2011-05-31 NOTE — ED Notes (Signed)
C/o pain to center of mid back that started yesterday.  States she has also had frequent belching, vomited x 1 yesterday and then again around 3 am today.  States today she is also experiencing pain across the upper abdomen.  Denies fever or diarrhea.  Reports she has been to ED for similar sx 2 other times.  Denies know precipitating or relieving factors.  She is 9 months post partum and breast feeding.

## 2011-05-31 NOTE — ED Provider Notes (Signed)
History     CSN: [id number]  Arrival date & time 05/31/11  4098   First MD Initiated Contact with Patient 05/31/11 1016      Chief Complaint  Patient presents with  . Back Pain  . Abdominal Pain    (Consider location/radiation/quality/duration/timing/severity/associated sxs/prior treatment) HPI Comments: Rita Lamb is in today for several complaints: Lower back pain and epigastric pain. She has had both of these problems been before and has been to the emergency room several times.  The lower back pain has been going on for 2 days. She denies any injury. It's worse if she moves. There is no radiation, no numbness, tingling, weakness, bladder, or bowel complaints. She states she's been to the emergency room with back, abdomen, and chest pain twice before, once on June 15 and once on December 24. Both times she had EKGs and in June she had a stress test. In December just had some blood work and then was given oxycodone for pain. She has not had any x-rays.  The epigastric pain has also been going on for 2 days. It's worse with meals. She has had similar pain in the past. No history of ulcer disease. She's felt nauseated and vomited twice. No blood in emesis. No black, tarry stools.  Patient is a 36 y.o. female presenting with back pain and abdominal pain.  Back Pain  Associated symptoms include abdominal pain. Pertinent negatives include no chest pain, no fever, no numbness, no dysuria and no weakness.  Abdominal Pain The primary symptoms of the illness include abdominal pain, nausea and vomiting. The primary symptoms of the illness do not include fever, shortness of breath, diarrhea or dysuria.  Additional symptoms associated with the illness include back pain. Symptoms associated with the illness do not include chills, constipation, urgency or frequency.    Past Medical History  Diagnosis Date  . Asthma   . Bell's palsy     Past Surgical History  Procedure Date  .  Tonsillectomy   . Adnoidectomy     History reviewed. No pertinent family history.  History  Substance Use Topics  . Smoking status: Never Smoker   . Smokeless tobacco: Not on file  . Alcohol Use: No    OB History    Grav Para Term Preterm Abortions TAB SAB Ect Mult Living                  Review of Systems  Constitutional: Negative for fever, chills, appetite change and unexpected weight change.  Respiratory: Negative for cough, shortness of breath and wheezing.   Cardiovascular: Negative for chest pain.  Gastrointestinal: Positive for nausea, vomiting and abdominal pain. Negative for diarrhea, constipation, blood in stool, abdominal distention, anal bleeding and rectal pain.  Genitourinary: Negative for dysuria, urgency, frequency and difficulty urinating.  Musculoskeletal: Positive for back pain. Negative for myalgias, joint swelling, arthralgias and gait problem.  Skin: Negative for rash.  Neurological: Negative for weakness and numbness.    Allergies  Sulfa antibiotics  Home Medications   Current Outpatient Rx  Name Route Sig Dispense Refill  . ALBUTEROL IN Inhalation Inhale into the lungs as needed.    . ACETAMINOPHEN 500 MG PO TABS Oral Take 500 mg by mouth every 6 (six) hours as needed. For headaches     . DOXYCYCLINE HYCLATE 100 MG PO TABS Oral Take 1 tablet (100 mg total) by mouth 2 (two) times daily. 20 tablet 0  . METRONIDAZOLE 500 MG PO TABS Oral Take 1  tablet (500 mg total) by mouth QID. 40 tablet 0  . OMEPRAZOLE 20 MG PO CPDR Oral Take 1 capsule (20 mg total) by mouth 2 times daily at 12 noon and 4 pm. 20 capsule 0  . ONDANSETRON 8 MG PO TBDP Oral Take 1 tablet (8 mg total) by mouth every 8 (eight) hours as needed for nausea. 20 tablet 0  . TRAMADOL HCL 50 MG PO TABS Oral Take 2 tablets (100 mg total) by mouth every 8 (eight) hours as needed for pain. 30 tablet 0    BP 151/85  Pulse 82  Temp(Src) 98.5 F (36.9 C) (Oral)  Resp 18  SpO2 99%  LMP  05/11/2011  Physical Exam  Nursing note and vitals reviewed. Constitutional: She is oriented to person, place, and time. She appears well-developed and well-nourished. No distress.  Eyes: No scleral icterus.  Cardiovascular: Normal rate, regular rhythm and normal heart sounds.  Exam reveals no gallop and no friction rub.   No murmur heard. Pulmonary/Chest: Effort normal and breath sounds normal. No respiratory distress. She has no wheezes. She has no rales.  Abdominal: Soft. Bowel sounds are normal. She exhibits no distension, no abdominal bruit, no pulsatile midline mass and no mass. There is no hepatosplenomegaly. There is tenderness (she has mild epigastric tenderness to palpation without guarding or rebound). There is no rebound, no guarding and no CVA tenderness.       There is no midline pulsatile abdominal mass or bruit.  Musculoskeletal: She exhibits tenderness. She exhibits no edema.       Lumbar back: She exhibits decreased range of motion, tenderness, bony tenderness and pain. She exhibits no swelling, no edema, no deformity, no spasm and normal pulse.       Exam of her lower back reveals midline tenderness to palpation in the mid lumbar spine. Her back has a fairly good range of motion with pain on movement. Straight leg raising was negative. DTRs and muscle strength are normal. Pulses were full.  Neurological: She is alert and oriented to person, place, and time. She has normal reflexes. She displays no atrophy. No sensory deficit. She exhibits normal muscle tone. Coordination and gait normal.  Skin: Skin is warm and dry. No rash noted. She is not diaphoretic.    ED Course  Procedures (including critical care time)  Results for orders placed during the hospital encounter of 05/31/11  POCT H PYLORI SCREEN      Component Value Range   H. PYLORI SCREEN, POC POSITIVE (*) NEGATIVE   POCT URINALYSIS DIP (DEVICE)      Component Value Range   Glucose, UA NEGATIVE  NEGATIVE (mg/dL)    Bilirubin Urine SMALL (*) NEGATIVE    Ketones, ur NEGATIVE  NEGATIVE (mg/dL)   Specific Gravity, Urine 1.015  1.005 - 1.030    Hgb urine dipstick LARGE (*) NEGATIVE    pH 6.5  5.0 - 8.0    Protein, ur 30 (*) NEGATIVE (mg/dL)   Urobilinogen, UA 0.2  0.0 - 1.0 (mg/dL)   Nitrite NEGATIVE  NEGATIVE    Leukocytes, UA SMALL (*) NEGATIVE   POCT I-STAT, CHEM 8      Component Value Range   Sodium 141  135 - 145 (mEq/L)   Potassium 4.0  3.5 - 5.1 (mEq/L)   Chloride 100  96 - 112 (mEq/L)   BUN 6  6 - 23 (mg/dL)   Creatinine, Ser 4.54  0.50 - 1.10 (mg/dL)   Glucose, Bld 99  70 -  99 (mg/dL)   Calcium, Ion 1.61  0.96 - 1.32 (mmol/L)   TCO2 30  0 - 100 (mmol/L)   Hemoglobin 16.7 (*) 12.0 - 15.0 (g/dL)   HCT 04.5 (*) 40.9 - 46.0 (%)      Labs Reviewed  POCT H PYLORI SCREEN - Abnormal; Notable for the following:    H. PYLORI SCREEN, POC POSITIVE (*)    All other components within normal limits  POCT URINALYSIS DIP (DEVICE) - Abnormal; Notable for the following:    Bilirubin Urine SMALL (*)    Hgb urine dipstick LARGE (*)    Protein, ur 30 (*)    Leukocytes, UA SMALL (*) Biochemical Testing Only. Please order routine urinalysis from main lab if confirmatory testing is needed.   All other components within normal limits  POCT I-STAT, CHEM 8 - Abnormal; Notable for the following:    Hemoglobin 16.7 (*)    HCT 49.0 (*)    All other components within normal limits  POCT URINALYSIS DIPSTICK  I-STAT, CHEM 8  URINE CULTURE   Dg Abd Acute W/chest  05/31/2011  *RADIOLOGY REPORT*  Clinical Data: Vomiting.  Abdominal pain.  Asthma.  ACUTE ABDOMEN SERIES (ABDOMEN 2 VIEW & CHEST 1 VIEW)  Comparison: 05/01/2011  Findings: There is subsegmental atelectasis noted at the left lung base.  Otherwise the lungs appear clear.  Cardiac and mediastinal contours appear unremarkable.  No free intraperitoneal gas is evident beneath the hemidiaphragms.  Gas and formed stool noted in the colon.  No dilated small bowel  is observed.  No significant abnormal air-fluid levels.  IMPRESSION:  1.  Unremarkable bowel gas pattern.  Most of the small bowel is gasless. 2.  Linear subsegmental atelectasis at the left lung base.  Original Report Authenticated By: Dellia Cloud, M.D.     1. Back pain   2. Hematuria   3. H. pylori infection       MDM  She has epigastric pain with a positive H. pylori serology, suggesting gastritis or ulcer disease. Will treat with quadruple therapy and she is to followup with a gastroenterologist in 10 days. The back pain may or may not be related. This may just be a back strain. She did have some hematuria, I suggested she followup with her primary care physician about this. We did obtain a culture, but I did not treat with antibiotics since she's already on 2 different antibiotics plus other medications. If it does come back positive she'll need treatment for urinary tract infection as well.        Roque Lias, MD 05/31/11 [phone]

## 2011-06-02 ENCOUNTER — Encounter: Payer: Self-pay | Admitting: Gastroenterology

## 2011-06-20 ENCOUNTER — Ambulatory Visit (INDEPENDENT_AMBULATORY_CARE_PROVIDER_SITE_OTHER): Payer: Medicaid Other | Admitting: Gastroenterology

## 2011-06-20 ENCOUNTER — Encounter: Payer: Self-pay | Admitting: Gastroenterology

## 2011-06-20 VITALS — BP 146/80 | HR 92 | Ht 63.0 in | Wt 237.0 lb

## 2011-06-20 DIAGNOSIS — A048 Other specified bacterial intestinal infections: Secondary | ICD-10-CM

## 2011-06-20 NOTE — Progress Notes (Signed)
HPI: This is a   very pleasant 36 year old woman who is here with her 9-month-old child.    Recent H. pylori serology was positive. CBC was normal.  Was given 10 days treatment for h. Pylori.  Following the abx she felt better but still had some gassiness.  Over time she felt normal, back to normal.  Was having mid upper back discomfort.  She had nausea for a long time, minor vomiting.  Indigestion.  Took an alleve, rested.  Had similar symptoms 2-3 months ago that lasted 3 days. A lot of vomiting.    Had abd plain film at urgent care, essentially normal.    Overall stable weight, no overt gi bleeding.  Review of systems: Pertinent positive and negative review of systems were noted in the above HPI section. Complete review of systems was performed and was otherwise normal.    Past Medical History  Diagnosis Date  . Asthma   . Bell's palsy   . Arthritis   . Helicobacter pylori (H. pylori) infection     Past Surgical History  Procedure Date  . Tonsillectomy and adenoidectomy     Current Outpatient Prescriptions  Medication Sig Dispense Refill  . acetaminophen (TYLENOL) 500 MG tablet Take 500 mg by mouth every 6 (six) hours as needed. For headaches         Allergies as of 06/20/2011 - Review Complete 06/20/2011  Allergen Reaction Noted  . Sulfa antibiotics Rash 05/01/2011    Family History  Problem Relation Age of Onset  . Diabetes Father   . Diabetes Sister   . Colon cancer Paternal Grandfather     History   Social History  . Marital Status: Married    Spouse Name: N/A    Number of Children: 2  . Years of Education: N/A   Occupational History  . Daycare Worker     Social History Main Topics  . Smoking status: Never Smoker   . Smokeless tobacco: Never Used  . Alcohol Use: No  . Drug Use: No  . Sexually Active: Yes    Birth Control/ Protection: Injection   Other Topics Concern  . Not on file   Social History Narrative  . No narrative on file        Physical Exam: BP 146/80  Pulse 92  Ht 5\' 3"  (1.6 m)  Wt 237 lb (107.502 kg)  BMI 41.98 kg/m2  LMP 05/11/2011 Constitutional: generally well-appearing, except for morbid obesity Psychiatric: alert and oriented x3 Eyes: extraocular movements intact Mouth: oral pharynx moist, no lesions Neck: supple no lymphadenopathy Cardiovascular: heart regular rate and rhythm Lungs: clear to auscultation bilaterally Abdomen: soft, nontender, nondistended, no obvious ascites, no peritoneal signs, normal bowel sounds Extremities: no lower extremity edema bilaterally Skin: no lesions on visible extremities    Assessment and plan: 36 y.o. female with  recent dyspepsia, H. pylori positive by serology.  Her symptoms improved after H. pylori eradication antibiotics. She knows to call here if she has further troubles and at that point I would probably proceed with an EGD. Currently she is symptom free after appropriate therapy and I do not think she needs EGD given the lack of any alarm symptoms.

## 2011-06-20 NOTE — Patient Instructions (Signed)
Call Dr. Christella Hartigan' office if you have repeat symptoms, you would probably need EGD at that point.

## 2011-09-23 ENCOUNTER — Encounter (HOSPITAL_COMMUNITY): Payer: Self-pay

## 2011-09-23 ENCOUNTER — Emergency Department (HOSPITAL_COMMUNITY)
Admission: EM | Admit: 2011-09-23 | Discharge: 2011-09-23 | Disposition: A | Discharge status: Home / Self Care | Payer: Medicaid Other | Source: Home / Self Care | Attending: Emergency Medicine | Admitting: Emergency Medicine

## 2011-09-23 DIAGNOSIS — S0500XA Injury of conjunctiva and corneal abrasion without foreign body, unspecified eye, initial encounter: Secondary | ICD-10-CM

## 2011-09-23 DIAGNOSIS — S058X9A Other injuries of unspecified eye and orbit, initial encounter: Secondary | ICD-10-CM

## 2011-09-23 MED ORDER — TETRACAINE HCL 0.5 % OP SOLN
2.0000 [drp] | Freq: Once | OPHTHALMIC | Status: AC
  Administered 2011-09-23: 2 [drp] via OPHTHALMIC

## 2011-09-23 MED ORDER — TOBRAMYCIN 0.3 % OP SOLN: 1.0000 [drp] | Freq: Four times a day (QID) | OPHTHALMIC | Status: AC

## 2011-09-23 MED ORDER — TETRACAINE HCL 0.5 % OP SOLN
OPHTHALMIC | Status: AC
  Filled 2011-09-23: qty 2

## 2011-09-23 MED ORDER — HYDROCODONE-ACETAMINOPHEN 5-500 MG PO TABS: 1.0000 | ORAL_TABLET | Freq: Four times a day (QID) | ORAL | Status: AC | PRN

## 2011-09-23 NOTE — ED Provider Notes (Signed)
History     CSN: [id number]  Arrival date & time 09/23/11  1108   First MD Initiated Contact with Patient 09/23/11 1121      Chief Complaint  Patient presents with  . Eye Pain    (Consider location/radiation/quality/duration/timing/severity/associated sxs/prior treatment) HPI Comments: I was just working with his exam and I so than we experience a severe sudden onset of left eye pain, keep getting this in term attempt throbbing pain is coming from I. It hurts when I blinker move my eye. Have not express any changes in vision. No tearing, but this pain is given him a headache. No nausea, no vomiting, no fevers. I do not recall injuring my eye in any way or something hitting my. But it's tender"  Patient is a 36 y.o. female presenting with eye pain.  Eye Pain This is a new problem. The current episode started 3 to 5 hours ago. The problem occurs constantly. The problem has been gradually worsening. Exacerbated by: blinking. The symptoms are relieved by nothing. She has tried nothing for the symptoms.    Past Medical History  Diagnosis Date  . Asthma   . Bell's palsy   . Arthritis   . Helicobacter pylori (H. pylori) infection     Past Surgical History  Procedure Date  . Tonsillectomy and adenoidectomy     Family History  Problem Relation Age of Onset  . Diabetes Father   . Diabetes Sister   . Colon cancer Paternal Grandfather     History  Substance Use Topics  . Smoking status: Never Smoker   . Smokeless tobacco: Never Used  . Alcohol Use: No    OB History    Grav Para Term Preterm Abortions TAB SAB Ect Mult Living                  Review of Systems  Constitutional: Negative for fever, chills, diaphoresis and activity change.  Eyes: Positive for pain. Negative for photophobia, discharge, redness, itching and visual disturbance.    Allergies  Sulfa antibiotics  Home Medications   Current Outpatient Rx  Name Route Sig Dispense Refill  . ACETAMINOPHEN 500  MG PO TABS Oral Take 500 mg by mouth every 6 (six) hours as needed. For headaches     . HYDROCODONE-ACETAMINOPHEN 5-500 MG PO TABS Oral Take 1-2 tablets by mouth every 6 (six) hours as needed for pain. 15 tablet 0  . TOBRAMYCIN SULFATE 0.3 % OP SOLN Left Eye Place 1 drop into the left eye every 6 (six) hours. 5 mL 0    BP 143/92  Pulse 70  Temp(Src) 98.6 F (37 C) (Oral)  Resp 16  SpO2 98%  LMP 09/04/2011  Physical Exam  Nursing note and vitals reviewed. Constitutional: She appears well-developed and well-nourished.  Eyes: Conjunctivae and EOM are normal. Pupils are equal, round, and reactive to light. Right eye exhibits no discharge and no hordeolum. No foreign body present in the right eye. Left eye exhibits no discharge and no hordeolum. No foreign body present in the left eye. Right conjunctiva is not injected. Scleral icterus is present.  Fundoscopic exam:      The right eye shows no exudate. The right eye shows no red reflex.      The left eye shows no exudate. The left eye shows no red reflex. Slit lamp exam:      The left eye shows corneal abrasion and fluorescein uptake. The left eye shows no foreign body and no hyphema.  Cardiovascular: Normal rate.   Pulmonary/Chest: Effort normal.    ED Course  Procedures (including critical care time)  Labs Reviewed - No data to display No results found.   1. Corneal abrasion       MDM  Left corneal abrasion. Patient had a thorough same dye uptake lower pole of her left cornea with associated discomfort in headache. Patient had a consider it moderate dysfunction from a Bell's palsy that she experienced last year in June. Patient apparently did not undergo any type of physical therapy or nephrology followup as she reports. Patient is able to close her eyelid and seem to have some residual orbicularis function.        Jimmie Molly, MD 09/23/11 [phone]

## 2011-09-23 NOTE — ED Notes (Signed)
C/o that while she was in office w kids this AM, had sudden onset of left eye pain, now pain constant w waves of worsening pain; hist of Bells palsy on left , since 6-12, observable residual eyelid deficit, states that for past week , lip has felt fat; no symptoms lower than chin

## 2011-09-23 NOTE — Discharge Instructions (Signed)
  As discussed followup with eye doctor and start with prescribed antibiotic.   Corneal Abrasion The cornea is the clear covering at the front and center of the eye. It is a thin tissue made up of layers. The top layer is the most sensitive layer. A corneal abrasion happens if this layer is scratched or an injury causes it to come off.  HOME CARE  You may be given drops or a medicated cream. Use the medicine as told by your doctor.   A pressure patch may be put over the eye. If this is done, follow your doctor's instructions for when to remove the patch. Do not drive or use machines while the eye patch is on. Judging distances is hard to do with a patch on.   See your doctor for a follow-up exam if you are told to do so.  GET HELP RIGHT AWAY IF:   The pain is getting worse or is very bad.   The eye is very sensitive to light.   Any liquid comes out of the injured eye after treatment.   Your vision suddenly gets worse.   You have a sudden loss of vision or blindness.  MAKE SURE YOU:   Understand these instructions.   Will watch your condition.   Will get help right away if you are not doing well or get worse.  Document Released: 10/12/2007 Document Revised: 04/14/2011 Document Reviewed: 10/12/2007 [facility] Patient Information 2012 [place], [place].

## 2011-10-07 ENCOUNTER — Encounter: Payer: Self-pay | Admitting: Family Medicine

## 2011-10-07 ENCOUNTER — Ambulatory Visit (HOSPITAL_COMMUNITY)
Admission: RE | Admit: 2011-10-07 | Discharge: 2011-10-07 | Disposition: A | Discharge status: Home / Self Care | Payer: Medicaid Other | Source: Ambulatory Visit | Attending: Family Medicine | Admitting: Family Medicine

## 2011-10-07 ENCOUNTER — Ambulatory Visit (INDEPENDENT_AMBULATORY_CARE_PROVIDER_SITE_OTHER): Payer: Medicaid Other | Admitting: Family Medicine

## 2011-10-07 VITALS — BP 144/84 | HR 74 | Temp 98.6°F | Ht 63.0 in | Wt 243.0 lb

## 2011-10-07 DIAGNOSIS — I10 Essential (primary) hypertension: Secondary | ICD-10-CM

## 2011-10-07 DIAGNOSIS — J45909 Unspecified asthma, uncomplicated: Secondary | ICD-10-CM

## 2011-10-07 DIAGNOSIS — H6120 Impacted cerumen, unspecified ear: Secondary | ICD-10-CM

## 2011-10-07 DIAGNOSIS — M25561 Pain in right knee: Secondary | ICD-10-CM | POA: Insufficient documentation

## 2011-10-07 DIAGNOSIS — Z309 Encounter for contraceptive management, unspecified: Secondary | ICD-10-CM

## 2011-10-07 DIAGNOSIS — O09299 Supervision of pregnancy with other poor reproductive or obstetric history, unspecified trimester: Secondary | ICD-10-CM | POA: Insufficient documentation

## 2011-10-07 DIAGNOSIS — M25562 Pain in left knee: Secondary | ICD-10-CM | POA: Insufficient documentation

## 2011-10-07 DIAGNOSIS — M25569 Pain in unspecified knee: Secondary | ICD-10-CM | POA: Insufficient documentation

## 2011-10-07 DIAGNOSIS — E669 Obesity, unspecified: Secondary | ICD-10-CM

## 2011-10-07 DIAGNOSIS — J4520 Mild intermittent asthma, uncomplicated: Secondary | ICD-10-CM

## 2011-10-07 DIAGNOSIS — Z8632 Personal history of gestational diabetes: Secondary | ICD-10-CM

## 2011-10-07 LAB — POCT URINE PREGNANCY: Preg Test, Ur: NEGATIVE

## 2011-10-07 MED ORDER — MEDROXYPROGESTERONE ACETATE 150 MG/ML IM SUSP
150.0000 mg | Freq: Once | INTRAMUSCULAR | Status: AC
  Administered 2011-10-07: 150 mg via INTRAMUSCULAR

## 2011-10-07 NOTE — Patient Instructions (Signed)
I have ordered  knee films Please go to radiology to have these done  Please make an appointment to come back for labs I would like you to not eat or drink except for water or black coffee or tea for 8 hours before you come  Please come back for a nurse visit in 2 weeks to recheck her blood pressure

## 2011-10-10 NOTE — Assessment & Plan Note (Signed)
Will have patient return for nurse visit to recheck blood pressure. If still elevated, will begin medication. We discussed diet and exercise interventions today. BP Readings from Last 3 Encounters:  10/07/11 144/84  09/23/11 143/92  06/20/11 146/80

## 2011-10-10 NOTE — Assessment & Plan Note (Signed)
Plan obtain standing x-rays    an      consider  A    Ff nd d consider referral to orthopedics and consider referral

## 2011-10-10 NOTE — Assessment & Plan Note (Signed)
Will have patient return for labs to check A1c as well as LDL

## 2011-10-10 NOTE — Progress Notes (Signed)
  Subjective:    Patient ID: Rita Lamb, female    DOB: 1976-02-11, 36 y.o.   MRN: [id number]  HPI Patient presents today for new patient appointment.  Knee pain-patient states that she has had long-standing arthritis in her knees. She's been evaluated by orthopedic surgeons in the past and told she needs to wear her brace. She has not used this. She takes Motrin as needed for pain.  History of Bell's palsy several years ago. Patient notes that she still has a left-sided facial droop. This affects her eyebrow on her mouth. She says that the droop is more pronounced when she is tired.  GYN-patient typically sees a gynecologist for her Pap smears and birth control. She is on Depo. She plans to return to her gynecologist would like a double shot today.  Blood pressure-patient has been told her blood pressure is elevated in the past but she thinks it has been normal since her last baby was born. She denies headaches, chest pain, shortness of breath. She is on any medications. She does not check blood pressure at home.    Review of Systems Denies CP, SOB, HA, N/V/D, fever     Objective:   Physical Exam Vital signs reviewed General appearance - alert, well appearing, and in no distress Heart - normal rate, regular rhythm, normal S1, S2, no murmurs, rubs, clicks or gallops Chest - clear to auscultation, no wheezes, rales or rhonchi, symmetric air entry, no tachypnea, retractions or cyanosis Neuro- there is slight left-sided drooping of the mouth and eyebrow. Patient is able to open and close her eyelid well. She is not having slurring of her speech. Abdomen - soft, nontender, nondistended, no masses or organomegaly        Assessment & Plan:

## 2011-10-12 ENCOUNTER — Other Ambulatory Visit (INDEPENDENT_AMBULATORY_CARE_PROVIDER_SITE_OTHER): Payer: Medicaid Other

## 2011-10-12 DIAGNOSIS — Z8632 Personal history of gestational diabetes: Secondary | ICD-10-CM

## 2011-10-12 DIAGNOSIS — I10 Essential (primary) hypertension: Secondary | ICD-10-CM

## 2011-10-12 LAB — COMPREHENSIVE METABOLIC PANEL
ALT: 8 U/L (ref 0–35)
Albumin: 4 g/dL (ref 3.5–5.2)
Alkaline Phosphatase: 81 U/L (ref 39–117)
CO2: 24 mEq/L (ref 19–32)
Glucose, Bld: 100 mg/dL — ABNORMAL HIGH (ref 70–99)
Potassium: 4.2 mEq/L (ref 3.5–5.3)
Sodium: 140 mEq/L (ref 135–145)
Total Protein: 6.9 g/dL (ref 6.0–8.3)

## 2011-10-12 LAB — LIPID PANEL
LDL Cholesterol: 108 mg/dL — ABNORMAL HIGH (ref 0–99)
Triglycerides: 64 mg/dL (ref <150)

## 2011-10-12 NOTE — Progress Notes (Unsigned)
CMP,FLP AND A1C DONE TODAY Elfriede Bonini 

## 2011-10-14 ENCOUNTER — Encounter: Payer: Self-pay | Admitting: Family Medicine

## 2011-10-27 ENCOUNTER — Encounter: Payer: Self-pay | Admitting: Family Medicine

## 2011-10-27 ENCOUNTER — Ambulatory Visit (INDEPENDENT_AMBULATORY_CARE_PROVIDER_SITE_OTHER): Payer: Medicaid Other | Admitting: Family Medicine

## 2011-10-27 VITALS — BP 145/85 | HR 76 | Ht 63.0 in | Wt 240.0 lb

## 2011-10-27 DIAGNOSIS — M25569 Pain in unspecified knee: Secondary | ICD-10-CM

## 2011-10-27 DIAGNOSIS — M25561 Pain in right knee: Secondary | ICD-10-CM

## 2011-10-27 DIAGNOSIS — M25562 Pain in left knee: Secondary | ICD-10-CM

## 2011-10-27 DIAGNOSIS — I10 Essential (primary) hypertension: Secondary | ICD-10-CM

## 2011-10-27 MED ORDER — HYDROCHLOROTHIAZIDE 12.5 MG PO CAPS: 12.5000 mg | ORAL_CAPSULE | Freq: Every day | ORAL | Status: DC

## 2011-10-27 NOTE — Patient Instructions (Addendum)
Please make an appointment for sports medicine at our front desk They can talk to you in more depth about your knees  Please start HCTZ Come back in one week for a nurse visit to recheck your blood pressure  Come back in one month to meet your new doctor

## 2011-10-28 ENCOUNTER — Encounter: Payer: Self-pay | Admitting: Family Medicine

## 2011-10-28 NOTE — Assessment & Plan Note (Signed)
Blood pressure remains elevated. She's been on HCTZ before so I have restarted that now. She is to return in one week for nurse visit in one month to meet her new doctor.

## 2011-10-28 NOTE — Progress Notes (Signed)
  Subjective:    Patient ID: Rita Lamb, female    DOB: 1975-05-16, 36 y.o.   MRN: [id number]  HPI  Patient presents today for reevaluation of her knee pain. She is left greater than right knee pain without swelling. This has been going on for greater than 10 years. She will occasionally take Motrin for the pain. She would like to be evaluated further for this but she does not want an injection. She would like to know if I think glucosamine and chondroitin is a good supplement for her to take.  Hypertension-patient has been treated for hypertension the past of HCTZ with good results. She denies chest pain, shortness of breath, dizziness, headache. She is willing to start treatment today.  Review of Systems See above    Objective:   Physical Exam Vital signs reviewed General appearance - alert, well appearing, and in no distress Chest - clear to auscultation, no wheezes, rales or rhonchi, symmetric air entry, no tachypnea, retractions or cyanosis Heart - normal rate, regular rhythm, normal S1, S2, no murmurs, rubs, clicks or gallops        Assessment & Plan:

## 2011-10-28 NOTE — Assessment & Plan Note (Signed)
Mild osteoarthritis according to x-rays. Patient would like to see sports medicine so I have asked her to make that appointment.

## 2011-11-07 ENCOUNTER — Ambulatory Visit (INDEPENDENT_AMBULATORY_CARE_PROVIDER_SITE_OTHER): Payer: Medicaid Other | Admitting: *Deleted

## 2011-11-07 ENCOUNTER — Ambulatory Visit (INDEPENDENT_AMBULATORY_CARE_PROVIDER_SITE_OTHER): Payer: Medicaid Other | Admitting: Family Medicine

## 2011-11-07 VITALS — BP 144/90

## 2011-11-07 DIAGNOSIS — M25561 Pain in right knee: Secondary | ICD-10-CM

## 2011-11-07 DIAGNOSIS — M25569 Pain in unspecified knee: Secondary | ICD-10-CM

## 2011-11-07 DIAGNOSIS — I10 Essential (primary) hypertension: Secondary | ICD-10-CM

## 2011-11-07 NOTE — Progress Notes (Signed)
In for BP check. BP checked manually using large adult cuff.  BP RA 150/90, and LA 144/90 pulse 88. Taking HCTZ 12.5 mg  as directed. Consulted with Dr. Earnest Bailey and she  advises  for patient to increase HCTZ to 25 mg daily.  Appointment scheduled with Dr. Birdie Sons for 07/15.

## 2011-11-09 NOTE — Progress Notes (Signed)
  Subjective:    Patient ID: Rita Lamb, female    DOB: 1976/05/05, 36 y.o.   MRN: [id number]  HPI  But left greater than right knee pain for about 10 years. Recently had an x-ray. Pain is pretty constant, worse at the end of the day, worse with doing a lot of walking or standing or climbing stairs. She's not had any specific swelling no locking or giving way. Has not noted warmth or redness of the joint. Has not had specific injury to either joint  PERTINENT  PMH / PSH:  no hx knee surgery Review of Systems Denies fever, sweats, chills. No unusual weight change recently.    Objective:   Physical Exam  Vital signs reviewed. GENERAL: Well developed, well nourished, no acute distress. obese KNEES: Full range of motion in flexion and extension. Ligamentously intact to varus and valgus stress and anterior drawer, Lachman on both knees. Neither knee has an effusion. Bilaterally her calves are soft and distally she is neurovascularly intact. There is some medial joint line tenderness on the left knee. INJECTION: Patient was given informed consent, signed copy in the chart. Appropriate time out was taken. Area prepped and draped in usual sterile fashion. 1 cc of methylprednisolone 40 mg/ml plus  4 cc of 1% lidocaine without epinephrine was injected into the left knee using a(n) anterior medial approach. The patient tolerated the procedure well. There were no complications. Post procedure instructions were given.       Assessment & Plan:  DJD bilaterally of the knee. We had a long discussion. Nothing really have to offer her is corticosteroid injection. We will try down the left knee she has significant improvement she can return for see [place] on the right. She has no improvement then I would not do a second injection. We did also give her home exercise program for general knee rehabilitation and strengthening.

## 2011-11-14 ENCOUNTER — Telehealth: Payer: Self-pay | Admitting: Family Medicine

## 2011-11-14 NOTE — Telephone Encounter (Signed)
LMOVM for pt to call back.  Does she have enough to last until her appt on Monday?  If not we can call in the refill with 25mg  tablets.Milas Gain, Maryjo Rochester

## 2011-11-14 NOTE — Telephone Encounter (Signed)
Patient is calling because her dose of HCTZ was increased from 1 to 2 so the quantity needs to be changed because the dose will have her run out in half the time.  She has 11 refills, but the correction needs to be made.  [facility] on [address].  Please contact patient when this has been corrected.  She will run out by 7/10.

## 2011-11-14 NOTE — Telephone Encounter (Signed)
Patient is going to check her messages and then call back.  She called as soon as she missed the call from the office, even before the message was finished being left.

## 2011-11-14 NOTE — Telephone Encounter (Signed)
Only asked that pt call back, please ask the following when she calls back .Rita Lamb, Rita Lamb

## 2011-11-15 ENCOUNTER — Telehealth: Payer: Self-pay | Admitting: *Deleted

## 2011-11-15 DIAGNOSIS — I10 Essential (primary) hypertension: Secondary | ICD-10-CM

## 2011-11-15 MED ORDER — HYDROCHLOROTHIAZIDE 12.5 MG PO CAPS: 25.0000 mg | ORAL_CAPSULE | Freq: Every day | ORAL | Status: DC

## 2011-11-15 NOTE — Telephone Encounter (Signed)
Pt called back.  She will be out before Monday.  Sent in 1 month supply taking 2 capsules daily.  Will have MD refill for longer at her appt Monday. Merced Brougham, Maryjo Rochester

## 2011-11-21 ENCOUNTER — Ambulatory Visit: Payer: Medicaid Other | Admitting: Family Medicine

## 2011-12-05 ENCOUNTER — Ambulatory Visit (INDEPENDENT_AMBULATORY_CARE_PROVIDER_SITE_OTHER): Payer: Medicaid Other | Admitting: Family Medicine

## 2011-12-05 ENCOUNTER — Encounter: Payer: Self-pay | Admitting: Family Medicine

## 2011-12-05 VITALS — BP 136/80 | HR 82 | Ht 63.0 in | Wt 243.0 lb

## 2011-12-05 DIAGNOSIS — I10 Essential (primary) hypertension: Secondary | ICD-10-CM

## 2011-12-05 DIAGNOSIS — M25561 Pain in right knee: Secondary | ICD-10-CM

## 2011-12-05 DIAGNOSIS — H52202 Unspecified astigmatism, left eye: Secondary | ICD-10-CM

## 2011-12-05 DIAGNOSIS — M25569 Pain in unspecified knee: Secondary | ICD-10-CM

## 2011-12-05 DIAGNOSIS — H52209 Unspecified astigmatism, unspecified eye: Secondary | ICD-10-CM

## 2011-12-05 MED ORDER — HYDROCHLOROTHIAZIDE 12.5 MG PO CAPS: 25.0000 mg | ORAL_CAPSULE | Freq: Every day | ORAL | Status: DC

## 2011-12-05 NOTE — Progress Notes (Signed)
  Subjective:    Patient ID: Rita Lamb, female    DOB: 1975/07/09, 36 y.o.   MRN: [id number]  HPI Patient presents for blood pressure follow-up.    1. HTN:  Was recently seen for a nursing blood pressure f/u visit and her BP was 150/90.  Her HCTZ was increased at that time from 12.5 mg daily to 25 mg daily.  Today her BP was 136/80.  She states she is tolerating the HCTZ well.  Her headaches have gone away and she feels less tired. She denies any light headedness, chest pain, dyspnea, dizziness, or weakness. She has noticed that she is urinating more frequently.  Also discussed diet and weight loss with patient.  States she eat healthy foods, but when asked about last couple of meals states she had a breakfast bowl with steak, eggs, cheese, and potatoes.  When told this was not a healthy option, the patient listed off why she thought this was healthy stating that the steak, eggs and cheese did not have any fat in them and that our body needs starches with regards to the potatoes.  When asked about the calorie content of this meal, she stated she didn't look.  For dinner the previous night she had baked [place] legs, canned collard greens, and au gratin potatoes.  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  2. Left eye astigmatism: patient states she has had an issue with this since 2008.  She has not seen an ophthalmologist since that time and is requesting a referral to an eye doctor that takes medicaid.  3. Bilateral Knee osteoarthritis: patient asked about use of glucosamine chondroitin for maintenance of cartilage in knees.  She has been seen in the sports medicine clinic for this issue and recently received a steroid injection in her left knee.   Filed Vitals:   12/05/11 1546  BP: 136/80  Pulse: 82    Review of Systems negative except per HPI     Objective:   Physical Exam  Constitutional: She is oriented to person, place,  and time. She appears well-developed and well-nourished.  HENT:  Head: Normocephalic and atraumatic.  Eyes: Conjunctivae and EOM are normal. Pupils are equal, round, and reactive to light.  Cardiovascular: Normal rate, regular rhythm and normal heart sounds.   Pulmonary/Chest: Effort normal and breath sounds normal.  Neurological: She is alert and oriented to person, place, and time.  Psychiatric: She has a normal mood and affect.          Assessment & Plan:

## 2011-12-05 NOTE — Assessment & Plan Note (Signed)
Patient desired referral to optometrist that takes medicaid since she has not had her vision checked since 2008.  Unfortunately, there are no optometrists or opthalmalogists that accept medicaid in [place].

## 2011-12-05 NOTE — Assessment & Plan Note (Signed)
Blood pressure improved on new dose of HCTZ 25 mg. Will continue this.  Discussed diet and weight loss as a method of further improving BP with the patient.  Appears to have misguided notions regarding proper diet.  Was given a copy of DASH diet and given Wyona Almas card to set up a nutritional appointment at her convenience.

## 2011-12-05 NOTE — Patient Instructions (Addendum)
Nice to meet you today. We discussed blood pressure, weight loss, and your vision. Your blood pressure is doing much better on your new dose of HCTZ. Please continue to take your HCTZ.  I sent your prescription to your pharmacy. I have given you Danie Chandler card.  She is our nutritionist.  Contact her at your convenience to talk about weight loss strategies. I have put in for an optometry referral.  Someone should contact you in the near future.  Please call our office if this does not occur in the next few weeks. I will see you back in 3 months to check on your blood pressure. Thank you for your visit.  DASH Diet The DASH diet stands for "Dietary Approaches to Stop Hypertension." It is a healthy eating plan that has been shown to reduce high blood pressure (hypertension) in as little as 14 days, while also possibly providing other significant health benefits. These other health benefits include reducing the risk of breast cancer after menopause and reducing the risk of type 2 diabetes, heart disease, colon cancer, and stroke. Health benefits also include weight loss and slowing kidney failure in patients with chronic kidney disease.  DIET GUIDELINES  Limit salt (sodium). Your diet should contain less than 1500 mg of sodium daily.   Limit refined or processed carbohydrates. Your diet should include mostly whole grains. Desserts and added sugars should be used sparingly.   Include small amounts of heart-healthy fats. These types of fats include nuts, oils, and tub margarine. Limit saturated and trans fats. These fats have been shown to be harmful in the body.  CHOOSING FOODS  The following food groups are based on a 2000 calorie diet. See your Registered Dietitian for individual calorie needs. Grains and Grain Products (6 to 8 servings daily)  Eat More Often: Whole-wheat bread, brown rice, whole-grain or wheat pasta, quinoa, popcorn without added fat or salt (air popped).   Eat Less Often:  White bread, white pasta, white rice, cornbread.  Vegetables (4 to 5 servings daily)  Eat More Often: Fresh, frozen, and canned vegetables. Vegetables may be raw, steamed, roasted, or grilled with a minimal amount of fat.   Eat Less Often/Avoid: Creamed or fried vegetables. Vegetables in a cheese sauce.  Fruit (4 to 5 servings daily)  Eat More Often: All fresh, canned (in natural juice), or frozen fruits. Dried fruits without added sugar. One hundred percent fruit juice ( cup [237 mL] daily).   Eat Less Often: Dried fruits with added sugar. Canned fruit in light or heavy syrup.  [employer], Fish, and Poultry (2 servings or less daily. One serving is 3 to 4 oz [85-114 g]).  Eat More Often: Ninety percent or leaner ground beef, tenderloin, sirloin. Round cuts of beef, chicken breast, [place] breast. All fish. Grill, bake, or broil your meat. Nothing should be fried.   Eat Less Often/Avoid: Fatty cuts of meat, [place], or chicken leg, thigh, or wing. Fried cuts of meat or fish.  Dairy (2 to 3 servings)  Eat More Often: Low-fat or fat-free milk, low-fat plain or light yogurt, reduced-fat or part-skim cheese.   Eat Less Often/Avoid: Milk (whole, 2%, skim, or chocolate).Whole milk yogurt. Full-fat cheeses.  Nuts, Seeds, and Legumes (4 to 5 servings per week)  Eat More Often: All without added salt.   Eat Less Often/Avoid: Salted nuts and seeds, canned beans with added salt.  Fats and Sweets (limited)  Eat More Often: Vegetable oils, tub margarines without trans fats, sugar-free  gelatin. Mayonnaise and salad dressings.   Eat Less Often/Avoid: Coconut oils, palm oils, butter, stick margarine, cream, half and half, cookies, candy, pie.  FOR MORE INFORMATION The Dash Diet Eating Plan: www.dashdiet.org Document Released: 04/14/2011 Document Reviewed: 04/04/2011 [facility] Patient Information 2012 [place], [place].

## 2011-12-05 NOTE — Assessment & Plan Note (Signed)
Patient asked about use of glucosamine chondroitin for her knees. Told her to be cautious in using this supplement given her HTN and asthma.  Advised that the best way to help decrease the pain in her knees would be to lose weight. She is to see sports medicine if she decides to do anymore steroid injections.

## 2011-12-29 ENCOUNTER — Other Ambulatory Visit: Payer: Self-pay | Admitting: Family Medicine

## 2011-12-29 DIAGNOSIS — I10 Essential (primary) hypertension: Secondary | ICD-10-CM

## 2011-12-30 NOTE — Telephone Encounter (Signed)
Re-ordered patient HCTZ 25 mg daily.

## 2012-01-27 ENCOUNTER — Ambulatory Visit (INDEPENDENT_AMBULATORY_CARE_PROVIDER_SITE_OTHER): Payer: Medicaid Other | Admitting: Family Medicine

## 2012-01-27 VITALS — BP 149/92 | Ht 63.0 in | Wt 243.0 lb

## 2012-01-27 DIAGNOSIS — M25561 Pain in right knee: Secondary | ICD-10-CM

## 2012-01-27 DIAGNOSIS — M25569 Pain in unspecified knee: Secondary | ICD-10-CM

## 2012-01-27 DIAGNOSIS — M25562 Pain in left knee: Secondary | ICD-10-CM

## 2012-01-31 NOTE — Assessment & Plan Note (Signed)
CSI left knee 11/2011 CSI right knee 01/2012

## 2012-01-31 NOTE — Progress Notes (Signed)
  Subjective:    Patient ID: Rita Lamb, female    DOB: 11-14-1975, 36 y.o.   MRN: [id number]  HPI  Followup bilateral knee pain. We had seen her in July for bilateral knee pain and gave her her first corticosteroid injection into her left knee. Going to see if that improved her symptoms and if so we would consider doing one in the right knee. She reports considerable improvement about 75% better from the time of injection and discussed continued to have improvement now . She would like to get an injection in her right knee today. Pain in the right knee is similar to what she was having in the leftt, it is constant aching. Worse with climbing stairs or sitting for long periods of time or walking. No locking or giving way. Occasionally she does have sharp pains in the front part of the knee additionally which is climbing stairs.   IMAGING: In May of 2013 she had bilateral standing knee films which showed medial compartment narrowing consistent with moderate to severe DJD medial compartment bilaterally. Review of Systems  denies fever. She's had no knee warmth or erythema.     Objective:   Physical Exam   vital signs are reviewed. Elevated blood pressure is noted. Marland Kitchen   GENERAL: Well-developed overweight female no acute distress KNEE: Right. Medial joint line tenderness. Lateral joint line is nontender. She has full range of motion in extension and flexion. Ligamentously intact. There is no effusion. Calf is soft. Distally she is neurovascularly intact.  INJECTION: Patient was given informed consent, signed copy in the chart. Appropriate time out was taken. Area prepped and draped in usual sterile fashion. One cc of methylprednisolone 40 mg/ml plus  4 cc of 1% lidocaine without epinephrine was injected into the right knee using a(n) anterior medial approach. The patient tolerated the procedure well. There were no complications. Post procedure instructions were given.       Assessment & Plan:    . #1. Bilateral medial joint compartment DJD. First CSI in the left knee was in July 2013. Today we did first CSI into right knee. We discussed minimum of 3 months in between individual joint injections. #2. Elevated blood pressure with diagnosis of hypertension.She is counseled to see her PCP regarding her blood pressure elevation

## 2012-02-02 ENCOUNTER — Telehealth: Payer: Self-pay | Admitting: Family Medicine

## 2012-02-02 NOTE — Telephone Encounter (Signed)
Patient has follow up appt scheduled for 02/15/12.

## 2012-02-02 NOTE — Telephone Encounter (Signed)
Made aware of recent blood pressure elevation when patient was seen in sports medicine clinic.  Patient was not scheduled for a follow-up appointment at time of last outpatient visit. Contacted patient to see if she could call the clinic to schedule a follow-up appointment to discuss her blood pressure.  Patient was not home and left a message with the patient's sister to have her call the clinic back.

## 2012-02-15 ENCOUNTER — Encounter: Payer: Self-pay | Admitting: Family Medicine

## 2012-02-15 ENCOUNTER — Ambulatory Visit (INDEPENDENT_AMBULATORY_CARE_PROVIDER_SITE_OTHER): Payer: Medicaid Other | Admitting: Family Medicine

## 2012-02-15 VITALS — BP 130/82 | HR 76 | Temp 98.4°F | Ht 63.0 in | Wt 241.0 lb

## 2012-02-15 DIAGNOSIS — R7309 Other abnormal glucose: Secondary | ICD-10-CM

## 2012-02-15 DIAGNOSIS — I10 Essential (primary) hypertension: Secondary | ICD-10-CM

## 2012-02-15 DIAGNOSIS — R7302 Impaired glucose tolerance (oral): Secondary | ICD-10-CM

## 2012-02-15 DIAGNOSIS — E785 Hyperlipidemia, unspecified: Secondary | ICD-10-CM | POA: Insufficient documentation

## 2012-02-15 DIAGNOSIS — L680 Hirsutism: Secondary | ICD-10-CM | POA: Insufficient documentation

## 2012-02-15 MED ORDER — PRAVASTATIN SODIUM 20 MG PO TABS: 20.0000 mg | ORAL_TABLET | Freq: Every day | ORAL | Status: DC

## 2012-02-15 NOTE — Assessment & Plan Note (Signed)
Patient with LDL of 108 and in conjunction with HTN, obesity, and questionable PCOS plan to start on low dose pravastatin. Will check LFTs at next visit and advised that if patient has muscle pains she should discontinue the medication.

## 2012-02-15 NOTE — Assessment & Plan Note (Signed)
Noted that patient had some hair growth over chin.  With history of irregular periods and difficulty getting pregnant, in addition to obesity may be a clue that patient has PCOS. Plan: will follow-up on this at next appointment with a total testosterone level.  Will consider addition of metformin for insulin resistance, as well as estrogen-progesterone contraceptive or spironolactone for hirsutism.

## 2012-02-15 NOTE — Assessment & Plan Note (Signed)
Patient with elevated blood pressure at previous visit to sports medicine clinic.  Blood pressure today is 136/80.  Currently on HCTZ 25 mg daily. Plan: continue patient on HCTZ 25 mg daily.  Asked that patient check blood pressure at home. Advised that it would be best to do this after sitting down and resting for 10-15 minutes. Advised to continue diet and exercise.  Will follow-up with me in clinic in 4 weeks for blood pressure check and will consider adding another agent if not controlled.

## 2012-02-15 NOTE — Progress Notes (Signed)
  Subjective:    Patient ID: ARELIS NEUMEIER, female    DOB: [birth year], 36 y.o.   MRN: [id number]  HPI Patient is a 36 yo female who presents in follow-up for her blood pressure.  HTN: patient last seen in July and had BP of 136/80 following increase in HCTZ to 25 mg daily.  Was recently seen in sports medicine clinic by Dr. Jennette Kettle and found to have BP of 149/92.  Patient states she didn't take her meds that day. Has not been checking BP at home.  States eating well as she cooks for a diabetic (her father).  States she has begun walking and walks stairs at work for exercise.    Review of Systems  Respiratory: Negative for shortness of breath.   Cardiovascular: Negative for chest pain.  Neurological: Negative for weakness, light-headedness and headaches.       Objective:   Physical Exam  Constitutional: She appears well-developed and well-nourished.       Obese  HENT:  Head: Normocephalic and atraumatic.  Cardiovascular: Normal rate, regular rhythm and normal heart sounds.   Pulmonary/Chest: Effort normal and breath sounds normal.  Musculoskeletal: She exhibits no edema.  Skin: Skin is warm and dry.       Hirsutism noted on face          Assessment & Plan:

## 2012-02-15 NOTE — Patient Instructions (Signed)
Good to see you today. You are doing a great job with diet and exercise and are moving in the right direction. Your blood pressure looked good today.  I want you to buy a blood pressure cuff and check your blood pressure at home.  I will have you follow up in one month.  Continue to take your HCTZ every day.  I am also starting you on a statin for your cholesterol. At your next visit we will check some labs to evaluate for PCOS and will consider starting on other medications at that time. Thanks for your visit today.

## 2012-02-17 ENCOUNTER — Telehealth: Payer: Self-pay | Admitting: Family Medicine

## 2012-02-17 DIAGNOSIS — I10 Essential (primary) hypertension: Secondary | ICD-10-CM

## 2012-02-17 NOTE — Telephone Encounter (Signed)
Is asking for a script for her BP machine - her ins will pay for it if she has a script

## 2012-02-22 MED ORDER — BLOOD PRESSURE MONITOR DEVI: 1.0000 | Freq: Once | Status: DC

## 2012-02-22 NOTE — Telephone Encounter (Signed)
Patient given prescription for home blood pressure machine.

## 2012-02-22 NOTE — Telephone Encounter (Signed)
Patient is calling because she hasn't heard anything back about the BP machine.  She would like the Rx to go to [facility].  She just wanted to let the MD know that she doesn't want him to be mad when she come in the next time and doesn't have any BP's to report because she can't take her BP without a machine.

## 2012-02-22 NOTE — Telephone Encounter (Signed)
Dr. Birdie Sons will be in clinic this afternoon.  Will have his review this message. Rita Lamb, Maryjo Rochester

## 2012-02-22 NOTE — Telephone Encounter (Signed)
Pt informed. Rita Lamb, Rita Lamb  

## 2012-02-22 NOTE — Telephone Encounter (Signed)
Prescription for blood pressure monitor sent to pharmacy. Please call patient and inform.

## 2012-03-19 ENCOUNTER — Encounter: Payer: Self-pay | Admitting: Family Medicine

## 2012-03-19 ENCOUNTER — Ambulatory Visit (INDEPENDENT_AMBULATORY_CARE_PROVIDER_SITE_OTHER): Payer: Medicaid Other | Admitting: Family Medicine

## 2012-03-19 VITALS — BP 139/88 | HR 78 | Temp 98.1°F | Ht 63.0 in | Wt 248.0 lb

## 2012-03-19 DIAGNOSIS — E8881 Metabolic syndrome: Secondary | ICD-10-CM

## 2012-03-19 DIAGNOSIS — I10 Essential (primary) hypertension: Secondary | ICD-10-CM

## 2012-03-19 LAB — COMPREHENSIVE METABOLIC PANEL
ALT: 9 U/L (ref 0–35)
AST: 11 U/L (ref 0–37)
Albumin: 4 g/dL (ref 3.5–5.2)
Alkaline Phosphatase: 81 U/L (ref 39–117)
BUN: 11 mg/dL (ref 6–23)
Calcium: 9.6 mg/dL (ref 8.4–10.5)
Chloride: 103 mEq/L (ref 96–112)
Potassium: 3.8 mEq/L (ref 3.5–5.3)
Sodium: 140 mEq/L (ref 135–145)
Total Protein: 7.2 g/dL (ref 6.0–8.3)

## 2012-03-19 LAB — POCT UA - MICROALBUMIN: Albumin/Creatinine Ratio, Urine, POC: ABNORMAL

## 2012-03-19 LAB — LIPID PANEL
HDL: 37 mg/dL — ABNORMAL LOW (ref >39)
LDL Cholesterol: 77 mg/dL (ref 0–99)
Total CHOL/HDL Ratio: 4.1 Ratio
VLDL: 37 mg/dL (ref 0–40)

## 2012-03-19 MED ORDER — METFORMIN HCL ER 500 MG PO TB24: 500.0000 mg | ORAL_TABLET | Freq: Every day | ORAL | Status: DC

## 2012-03-19 NOTE — Assessment & Plan Note (Signed)
Patient with signs of insulin resistance such as hirsutism, oligomenorrhea, and obesity. Also with history of gestational DM. Last A1c was 5.3. Patient with eating habits that might not be consistent with weight loss. Plan: will start on metformin 500 mg daily today.  Obtain CMET, lipid panel, and urine microalbumin. Asked that patient call Danie Chandler to set up a nutrition appointment.

## 2012-03-19 NOTE — Progress Notes (Signed)
  Subjective:    Patient ID: Rita Lamb, female    DOB: May 20, 1975, 36 y.o.   MRN: [id number]  HPI Patient is a 36 yo female who presents for BP follow-up.  HTN: patient has been checking BP at home, 119-138/69-92. She is compliant with her HCTZ. Denies chest pain, shortness of breath, weakness, light headedness, and headache.  Obesity: patient with increase in weight from previous of 241 lbs to 248 lbs.  Patient with long history of difficulty losing weight.  States she cooks for two diabetics at home and has avoided fried foods.  Also states her only exercise is at work and involves being on her feet all day. Does have hirsutism and irregular periods, of which she states she has probably on had 20 in her lifetime, and at most only 2 per year.    Review of Systems negative except per HPI     Objective:   Physical Exam  Constitutional: She appears well-developed and well-nourished.  HENT:  Head: Normocephalic and atraumatic.       Hair noted over chin and upper lip  Cardiovascular: Normal rate, regular rhythm and normal heart sounds.  Exam reveals no gallop and no friction rub.   No murmur heard. Pulmonary/Chest: Effort normal and breath sounds normal. She has no wheezes.  Musculoskeletal: She exhibits no edema.  Skin: Skin is warm and dry.          Assessment & Plan:

## 2012-03-19 NOTE — Patient Instructions (Signed)
Nice to see you today. We will get some labs today, to look at your lipid levels, electrolyte levels, and urine.  If these are abnormal I will call you. We will start metformin today at a low dose of 500 mg.  This can cause stomach upset. Please let us know if you have issues with this medication. I will see you back in 3 months. Thank you for your visit today.

## 2012-03-19 NOTE — Assessment & Plan Note (Signed)
Patient with BP at goal on HCTZ 25 mg daily. Plan: continue to check BP at home.  Will continue HCTZ 25 mg daily.  Follow-up in 3 months for BP check.

## 2012-03-26 ENCOUNTER — Ambulatory Visit (INDEPENDENT_AMBULATORY_CARE_PROVIDER_SITE_OTHER): Payer: Medicaid Other | Admitting: Family Medicine

## 2012-03-26 ENCOUNTER — Encounter: Payer: Self-pay | Admitting: Family Medicine

## 2012-03-26 VITALS — BP 115/77 | HR 73 | Temp 98.8°F | Ht 63.0 in | Wt 245.0 lb

## 2012-03-26 DIAGNOSIS — E8881 Metabolic syndrome: Secondary | ICD-10-CM

## 2012-03-26 DIAGNOSIS — E669 Obesity, unspecified: Secondary | ICD-10-CM

## 2012-03-26 DIAGNOSIS — Z0000 Encounter for general adult medical examination without abnormal findings: Secondary | ICD-10-CM | POA: Insufficient documentation

## 2012-03-26 NOTE — Assessment & Plan Note (Signed)
Continue metformin, again discussed exercise and dietary changes.

## 2012-03-26 NOTE — Progress Notes (Signed)
  Subjective:    Patient ID: Rita Lamb, female    DOB: [birth year], 36 y.o.   MRN: [id number]  HPI  Ms. Kelliher comes in for her well woman exam.    Health Maintenance:  - Pap done at Dr. Debria Garret office in 2012 - Patient refuses influenza vaccine despite having asthma - does not smoke - Had cholesterol and CMET done last week.   Obesity and PCOS: Patient says that she cooks very healthy because she has two diabetics in her home.  She says she only fries chicken about once a month.  She says she usually bakes.  She says she gets a lot of "exercise" at work.  She works 12 hours 3 days a week, so she does not know when she would have time to get continuous cardiovascular exercise.  She says she has about one period a year, if that.  The last time she had a period, she got pregnant the next month.  She has had two children, but says she had to try for 18-24 months for each child.  She is taking metformin for insulin resistance.   Past Medical History  Diagnosis Date  . Bell's palsy   . Helicobacter pylori (H. pylori) infection   . Arthritis     bilateral knee  . Asthma   . Gestational diabetes    Family History  Problem Relation Age of Onset  . Diabetes Father   . Diabetes Sister   . Alcohol abuse Sister   . Colon cancer Paternal Grandfather   . Cancer Paternal Grandfather   . Stroke Paternal Grandfather   . Alcohol abuse Mother   . HIV Mother   . Diabetes Mother   . Dementia Maternal Grandmother   . Cancer Maternal Grandmother 61    colon,   . Cancer Maternal Grandfather   . Diabetes Maternal Grandfather   . Cancer Paternal Grandmother   . Heart disease Paternal Grandmother   . Diabetes Paternal Grandmother    History  Substance Use Topics  . Smoking status: Never Smoker   . Smokeless tobacco: Never Used  . Alcohol Use: No     Review of Systems   See HPI Objective:   Physical Exam BP 115/77  Pulse 73  Temp 98.8 F (37.1 C) (Oral)  Ht 5\' 3"  (1.6 m)  Wt  245 lb (111.131 kg)  BMI 43.40 kg/m2 General appearance: alert, cooperative and no distress Neck: no adenopathy, supple, symmetrical, trachea midline and thyroid not enlarged, symmetric, no tenderness/mass/nodules Lungs: clear to auscultation bilaterally Heart: regular rate and rhythm, S1, S2 normal, no murmur, click, rub or gallop Extremities: extremities normal, atraumatic, no cyanosis or edema Pulses: 2+ and symmetric       Assessment & Plan:

## 2012-03-26 NOTE — Assessment & Plan Note (Signed)
Up to date on pap, labs, refuses flu shot.

## 2012-03-26 NOTE — Patient Instructions (Signed)
Please try to get 30 minutes of continuous cardiovascular exercise (vigorous walking) 4-5 days per week.   Please call Dr. Gerilyn Pilgrim to schedule a nutrition visit.   I highly recommend you get a flu shot.   To help you work on improving your nutrition, remember the plate rule for each meal:  - 1/4 of the plate or less should be a whole grain starch (brown rice, whole grain pasta, wheat bread, etc.)  - 1/4 of the plate should be a lean source of protein (chicken, [place], fish, beans, egg whites).  - 1/2 the plate or more should be fruits and vegetables - the more vegetables the better!

## 2012-03-26 NOTE — Assessment & Plan Note (Signed)
Patient in denial about unhealthy life style.  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 Also highly recommended she call Dr. Gerilyn Pilgrim for a nutrition appointment.

## 2012-04-04 ENCOUNTER — Ambulatory Visit: Payer: Medicaid Other | Admitting: Obstetrics and Gynecology

## 2012-04-25 ENCOUNTER — Ambulatory Visit (INDEPENDENT_AMBULATORY_CARE_PROVIDER_SITE_OTHER): Payer: Medicaid Other | Admitting: Family Medicine

## 2012-04-25 ENCOUNTER — Encounter: Payer: Self-pay | Admitting: Family Medicine

## 2012-04-25 VITALS — BP 107/66 | HR 65 | Temp 98.2°F | Ht 63.0 in | Wt 245.6 lb

## 2012-04-25 DIAGNOSIS — E8881 Metabolic syndrome: Secondary | ICD-10-CM

## 2012-04-25 DIAGNOSIS — R1013 Epigastric pain: Secondary | ICD-10-CM

## 2012-04-25 DIAGNOSIS — R110 Nausea: Secondary | ICD-10-CM

## 2012-04-25 DIAGNOSIS — R109 Unspecified abdominal pain: Secondary | ICD-10-CM

## 2012-04-25 LAB — POCT H PYLORI SCREEN: H Pylori Screen, POC: NEGATIVE

## 2012-04-25 LAB — POCT URINE PREGNANCY: Preg Test, Ur: NEGATIVE

## 2012-04-25 MED ORDER — PANTOPRAZOLE SODIUM 40 MG PO TBEC: 40.0000 mg | DELAYED_RELEASE_TABLET | Freq: Every day | ORAL | Status: DC

## 2012-04-25 NOTE — Assessment & Plan Note (Addendum)
This is likely a side effect from the Meformin. Hold metformin. Check urine pregnancy test, lipase, and H. Pylori to make sure that has resolved. Start short trial of pantoprazole. Told to return immediately if conditions worsen. F/u in 1 week.

## 2012-04-25 NOTE — Progress Notes (Signed)
  Subjective:    Patient ID: Rita Lamb, female    DOB: [birth year], 36 y.o.   MRN: [id number]  HPI  36 year old F w/ morbid obesity and history of H. Pylori infection who presents for evaluation of abdominal pain in the epigastric area. The patient is currently experiencing nausea without vomiting since Sunday night. Also feels the intense sensation to have a bowel movement, but denies diarrhea. Her bowel movements have been solid without blood or melena. She is also having excessive intestinal gas. She has felt these similar symptoms, but they were much more severe, when she had H. Pylori infection in 2012. She was last seen by her GI doctor, Dr. Christella Hartigan in Feb 2013. At the time, she had not problems and was told to follow up as needed. She reports LMP beginning of December, but notes a history of irregular period. She is currently having intercourse with her husband and is not taking contraception. She is also concerned that she may be having side effects from Metformin which she started in November.    PMH - no hx of appendicitis or cholecystitis  Review of Systems Early satiety Denies hematochezia, melena, fever    Objective:   Physical Exam BP 107/66  Pulse 65  Temp 98.2 F (36.8 C) (Oral)  Ht 5\' 3"  (1.6 m)  Wt 245 lb 9.6 oz (111.403 kg)  BMI 43.51 kg/m2  LMP 04/11/2012 Gen: white female, NAD, pleasant and conversant, obese, hirsutism  CV: RRR, no murmurs, rubs or gallops  Abd: obese, non distended, mild TTP to left of umbilicus, normo-active bowel sounds      Assessment & Plan:  36 year old F with non-specific GI symptoms and a benign physical exam.

## 2012-04-25 NOTE — Patient Instructions (Addendum)
Today we will check your lipase to rule out pancreatitis and check the H. Pylori antibody to make sure that infection has not returned. I will send you a letter with the results. In the meantime, please take the protonix everyday for 14 days. This block acids and may help with the pain. Also, use mylanta or TUMS as needed.   Please follow up immediately if you have persistent vomiting, severe abdominal pain with fever, or blood in your stool. Otherwise, follow up in 10 days.   Merry Christmas,   Dr. Clinton Sawyer

## 2012-05-07 ENCOUNTER — Encounter: Payer: Self-pay | Admitting: Family Medicine

## 2012-05-07 ENCOUNTER — Ambulatory Visit (INDEPENDENT_AMBULATORY_CARE_PROVIDER_SITE_OTHER): Payer: Medicaid Other | Admitting: Family Medicine

## 2012-05-07 VITALS — BP 143/90 | HR 63 | Ht 63.0 in | Wt 254.0 lb

## 2012-05-07 DIAGNOSIS — T887XXA Unspecified adverse effect of drug or medicament, initial encounter: Secondary | ICD-10-CM

## 2012-05-07 NOTE — Progress Notes (Signed)
  Subjective:    Patient ID: Rita Lamb, female    DOB: April 30, 1976, 36 y.o.   MRN: [id number]  HPI  36 year old F who is following up for abdominal pain. Her original visit was on 04/25/12. At the time, we checked her lipase, pregnancy test, and H. Pylori (she has a recent history of H. Pylori infection). All labs were normal/negative. Also, she was told to stop taking Metformin, which she is taking for PCOS/insulin resistance. Since that time, she notes that her symptoms has improved. She is not having any pain. She denies excessive flatulence, nausea, vomiting, diarrhea or constipation. Appetite is normal.   Review of Systems Negative unless stated above in HPI    Objective:   Physical Exam BP 143/90  Pulse 63  Ht 5\' 3"  (1.6 m)  Wt 254 lb (115.214 kg)  BMI 44.99 kg/m2  LMP 04/11/2012 Gen: obese female, well appearing Abd: obese, NDNT, NABS     Assessment & Plan:  36 year old F with GI distress as a side effect of Metformin. Will restart Metformin at 250 mg daily for 2 weeks and then increase to 500 mg. If it is not well tolerated, stop the medication and follow up with PCP. Patient in agreement with plan.   Si Raider Clinton Sawyer, MD, MBA 05/07/2012, 10:06 AM Family Medicine Resident, PGY-2 [phone] pager

## 2012-05-07 NOTE — Patient Instructions (Signed)
Please decrease the Metformin to 250 mg daily for 2 weeks and then increase to 500 mg after that. If it continues to cause pain, then stop the medication.   Happy New Year,   Dr. Clinton Sawyer

## 2013-05-20 ENCOUNTER — Encounter: Payer: Self-pay | Admitting: Family Medicine

## 2013-05-20 ENCOUNTER — Ambulatory Visit (INDEPENDENT_AMBULATORY_CARE_PROVIDER_SITE_OTHER): Payer: BC Managed Care – PPO | Admitting: Family Medicine

## 2013-05-20 VITALS — BP 170/103 | HR 69 | Temp 98.0°F | Ht 63.0 in | Wt 257.7 lb

## 2013-05-20 DIAGNOSIS — I10 Essential (primary) hypertension: Secondary | ICD-10-CM

## 2013-05-20 DIAGNOSIS — J45909 Unspecified asthma, uncomplicated: Secondary | ICD-10-CM

## 2013-05-20 DIAGNOSIS — R059 Cough, unspecified: Secondary | ICD-10-CM | POA: Insufficient documentation

## 2013-05-20 DIAGNOSIS — R05 Cough: Secondary | ICD-10-CM

## 2013-05-20 DIAGNOSIS — N912 Amenorrhea, unspecified: Secondary | ICD-10-CM

## 2013-05-20 DIAGNOSIS — J4520 Mild intermittent asthma, uncomplicated: Secondary | ICD-10-CM

## 2013-05-20 LAB — POCT URINE PREGNANCY: PREG TEST UR: NEGATIVE

## 2013-05-20 MED ORDER — HYDROCHLOROTHIAZIDE 12.5 MG PO CAPS: ORAL_CAPSULE | ORAL | Status: DC

## 2013-05-20 MED ORDER — IPRATROPIUM BROMIDE 0.06 % NA SOLN: 2.0000 | Freq: Four times a day (QID) | NASAL | Status: DC

## 2013-05-20 NOTE — Assessment & Plan Note (Signed)
No evidence of flare today

## 2013-05-20 NOTE — Addendum Note (Signed)
Addended by: Christen Bame D on: 05/20/2013 10:29 AM   Modules accepted: Orders

## 2013-05-20 NOTE — Progress Notes (Signed)
Rita Lamb is a 38 y.o. female who presents to [facility] today for SD appt. For cough  Cough and chest congestion: symptoms ongoing for 3 weeks. Has not seen anyone for this condition. Improving overall. Using inhaler BID for past 2 wks. No other meds. Cough in unproductive. Denies runny nose, fever, n/v/d/c, rash, sinus pressure, HA. Coughing is worse w/ exertion and certain smells.   HTN: not taking BP meds as felt fine recently. Has not taken for several months.    The following portions of the patient's history were reviewed and updated as appropriate: allergies, current medications, past medical history, family and social history, and problem list.  Patient is a nonsmoker.   Past Medical History  Diagnosis Date  . Bell's palsy   . Helicobacter pylori (H. pylori) infection   . Arthritis     bilateral knee  . Asthma   . Gestational diabetes     ROS as above otherwise neg.    Medications reviewed. Current Outpatient Prescriptions  Medication Sig Dispense Refill  . acetaminophen (TYLENOL) 500 MG tablet Take 500 mg by mouth every 6 (six) hours as needed. For headaches       . albuterol (PROVENTIL HFA;VENTOLIN HFA) 108 (90 BASE) MCG/ACT inhaler Inhale 2 puffs into the lungs every 6 (six) hours as needed.      . Blood Pressure Monitor DEVI 1 Device by Does not apply route once.  1 Device  0  . hydrochlorothiazide (MICROZIDE) 12.5 MG capsule TAKE 2 CAPSULES BY MOUTH DAILY  60 capsule  1  . ibuprofen (ADVIL,MOTRIN) 200 MG tablet Take 200 mg by mouth every 6 (six) hours as needed.      Marland Kitchen ipratropium (ATROVENT) 0.06 % nasal spray Place 2 sprays into both nostrils 4 (four) times daily.  15 mL  12  . metFORMIN (GLUCOPHAGE XR) 500 MG 24 hr tablet Take 1 tablet (500 mg total) by mouth daily with breakfast.  30 tablet  3  . pantoprazole (PROTONIX) 40 MG tablet Take 1 tablet (40 mg total) by mouth daily.  14 tablet  0   No current facility-administered medications for this visit.    Exam:   BP 170/103  Pulse 69  Temp(Src) 98 F (36.7 C)  Ht 5\' 3"  (1.6 m)  Wt 257 lb 11.2 oz (116.892 kg)  BMI 45.66 kg/m2  SpO2 97%  LMP 03/09/2013 Gen: Well NAD HEENT: EOMI,  MMM, no sinus ttp, boggy nasal turbinates w/ discharge present. , no lymphadenopathy Lungs: CTABL Nl WOB Heart: RRR no MRG Abd: NABS, NT, ND   No results found for this or any previous visit (from the past 72 hour(s)).  A/P (as seen in Problem list)  Hypertension Very elevated but asymptomatic Not taking meds for several months Refill HCTZ today   Asthma, intermittent No evidence of flare today  Cough Likely seconda4ry to post nasal drip from allergic type or URI infection Intranasal atrovent Rx sent Intranasal saline spray adn zyrtec, and delsym PRN OTC No need for further work up at this time.

## 2013-05-20 NOTE — Assessment & Plan Note (Signed)
Very elevated but asymptomatic Not taking meds for several months Refill HCTZ today

## 2013-05-20 NOTE — Assessment & Plan Note (Signed)
Likely seconda4ry to post nasal drip from allergic type or URI infection Intranasal atrovent Rx sent Intranasal saline spray adn zyrtec, and delsym PRN OTC No need for further work up at this time.

## 2013-05-20 NOTE — Patient Instructions (Addendum)
Your symptoms are likely from post nasal drip from nasal congestion Please start using nasal saline at least once a day to wash out the secretions Please start the intranasal atrovent to help dry up the secretions Please start Delsym for the cough There is no sign of asthma flare today Consider also using zyrtec for allergies  Please restart your blood pressure medication and come back in 2-4 weeks for recheck

## 2013-06-17 ENCOUNTER — Encounter: Payer: Self-pay | Admitting: Family Medicine

## 2013-06-17 ENCOUNTER — Ambulatory Visit (INDEPENDENT_AMBULATORY_CARE_PROVIDER_SITE_OTHER): Payer: BC Managed Care – PPO | Admitting: Family Medicine

## 2013-06-17 VITALS — BP 150/100 | HR 76 | Temp 98.4°F | Wt 254.0 lb

## 2013-06-17 DIAGNOSIS — I10 Essential (primary) hypertension: Secondary | ICD-10-CM

## 2013-06-17 DIAGNOSIS — J45909 Unspecified asthma, uncomplicated: Secondary | ICD-10-CM

## 2013-06-17 DIAGNOSIS — J4520 Mild intermittent asthma, uncomplicated: Secondary | ICD-10-CM

## 2013-06-17 DIAGNOSIS — H6120 Impacted cerumen, unspecified ear: Secondary | ICD-10-CM

## 2013-06-17 MED ORDER — AMLODIPINE BESYLATE 10 MG PO TABS: 10.0000 mg | ORAL_TABLET | Freq: Every day | ORAL | Status: DC

## 2013-06-17 NOTE — Progress Notes (Signed)
Patient ID: Rita Lamb, female   DOB: 1975/07/31, 38 y.o.   MRN: [id number]  Rita Rumps, MD Phone: [phone]  Rita Lamb is a 38 y.o. female who presents today for f/u HTN, asthma, and to discuss ear fullness.  HYPERTENSION Disease Monitoring Home BP Monitoring not checking Chest pain- no    Dyspnea- no Medications Compliance-  Taking for the past month.  Edema- no  Asthma: no night time awakenings. Not using albuterol at this time. Only use it when has a cold. She is not using ipratropium.  Ear fullness: states feels she has had over production of ear wax. Usually cleans out with wash cloth. Used to do drops at home to soften this. Once every other year has them flushed.  The following portions of the patient's history were reviewed and updated as appropriate: allergies, current medications, past medical history, family and social history, and problem list.  Patient is a nonsmoker.  Past Medical History  Diagnosis Date  . Bell's palsy   . Helicobacter pylori (H. pylori) infection   . Arthritis     bilateral knee  . Asthma   . Gestational diabetes   . Hypertension     History  Smoking status  . Never Smoker   Smokeless tobacco  . Never Used    Family History  Problem Relation Age of Onset  . Diabetes Father   . Diabetes Sister   . Alcohol abuse Sister   . Colon cancer Paternal Grandfather   . Cancer Paternal Grandfather   . Stroke Paternal Grandfather   . Alcohol abuse Mother   . HIV Mother   . Diabetes Mother   . Dementia Maternal Grandmother   . Cancer Maternal Grandmother 34    colon,   . Cancer Maternal Grandfather   . Diabetes Maternal Grandfather   . Cancer Paternal Grandmother   . Heart disease Paternal Grandmother   . Diabetes Paternal Grandmother     Current Outpatient Prescriptions on File Prior to Visit  Medication Sig Dispense Refill  . acetaminophen (TYLENOL) 500 MG tablet Take 500 mg by mouth every 6 (six) hours as needed. For  headaches       . albuterol (PROVENTIL HFA;VENTOLIN HFA) 108 (90 BASE) MCG/ACT inhaler Inhale 2 puffs into the lungs every 6 (six) hours as needed.      . Blood Pressure Monitor DEVI 1 Device by Does not apply route once.  1 Device  0  . hydrochlorothiazide (MICROZIDE) 12.5 MG capsule TAKE 2 CAPSULES BY MOUTH DAILY  60 capsule  1  . ibuprofen (ADVIL,MOTRIN) 200 MG tablet Take 200 mg by mouth every 6 (six) hours as needed.      Marland Kitchen ipratropium (ATROVENT) 0.06 % nasal spray Place 2 sprays into both nostrils 4 (four) times daily.  15 mL  12  . metFORMIN (GLUCOPHAGE XR) 500 MG 24 hr tablet Take 1 tablet (500 mg total) by mouth daily with breakfast.  30 tablet  3  . pantoprazole (PROTONIX) 40 MG tablet Take 1 tablet (40 mg total) by mouth daily.  14 tablet  0   No current facility-administered medications on file prior to visit.    ROS: Per HPI   Physical Exam Filed Vitals:   06/17/13 1628  BP: 150/100  Pulse: 76  Temp: 98.4 F (36.9 C)    Physical Examination: General appearance - alert, well appearing, and in no distress Ears - ceruminosis noted Chest - clear to auscultation, no wheezes, rales or rhonchi, symmetric  air entry Heart - normal rate, regular rhythm, normal S1, S2, no murmurs, rubs, clicks or gallops Extremities - no pedal edema noted  Assessment/Plan: Please see individual problem list.

## 2013-06-17 NOTE — Patient Instructions (Signed)
Nice to see you again. Please continue to take your HCTZ for your blood pressure. Please start taking the amlodipine each day as well. I will see you back in one month for follow-up. You will need to come back for a lab visit to have your kidney function checked.

## 2013-06-20 ENCOUNTER — Encounter: Payer: Self-pay | Admitting: Family Medicine

## 2013-06-20 DIAGNOSIS — H6120 Impacted cerumen, unspecified ear: Secondary | ICD-10-CM | POA: Insufficient documentation

## 2013-06-20 NOTE — Assessment & Plan Note (Signed)
No recent exacerbations. To continue prn albuterol.

## 2013-06-20 NOTE — Assessment & Plan Note (Signed)
Cerumen removed by nursing. Patient stated felt much better following this.

## 2013-06-20 NOTE — Assessment & Plan Note (Signed)
Not at goal after starting HCTZ. Will add amlodipine for further control. Patient to return for BMET to evaluate renal function.

## 2013-06-21 ENCOUNTER — Other Ambulatory Visit: Payer: BC Managed Care – PPO

## 2013-06-21 DIAGNOSIS — I10 Essential (primary) hypertension: Secondary | ICD-10-CM

## 2013-06-21 LAB — BASIC METABOLIC PANEL
BUN: 14 mg/dL (ref 6–23)
CHLORIDE: 100 meq/L (ref 96–112)
CO2: 31 mEq/L (ref 19–32)
Calcium: 9.5 mg/dL (ref 8.4–10.5)
Creat: 0.67 mg/dL (ref 0.50–1.10)
Glucose, Bld: 108 mg/dL — ABNORMAL HIGH (ref 70–99)
POTASSIUM: 3.6 meq/L (ref 3.5–5.3)
SODIUM: 140 meq/L (ref 135–145)

## 2013-06-21 NOTE — Progress Notes (Signed)
BMP DONE TODAY Birgitta Uhlir 

## 2013-06-25 ENCOUNTER — Encounter: Payer: Self-pay | Admitting: Family Medicine

## 2013-07-15 ENCOUNTER — Encounter: Payer: Self-pay | Admitting: Family Medicine

## 2013-07-15 ENCOUNTER — Ambulatory Visit (INDEPENDENT_AMBULATORY_CARE_PROVIDER_SITE_OTHER): Payer: BC Managed Care – PPO | Admitting: Family Medicine

## 2013-07-15 VITALS — BP 131/84 | HR 72 | Temp 99.5°F | Ht 63.0 in | Wt 257.6 lb

## 2013-07-15 DIAGNOSIS — D492 Neoplasm of unspecified behavior of bone, soft tissue, and skin: Secondary | ICD-10-CM

## 2013-07-15 DIAGNOSIS — I10 Essential (primary) hypertension: Secondary | ICD-10-CM

## 2013-07-15 DIAGNOSIS — Z23 Encounter for immunization: Secondary | ICD-10-CM

## 2013-07-15 MED ORDER — AMLODIPINE BESYLATE 10 MG PO TABS: 10.0000 mg | ORAL_TABLET | Freq: Every day | ORAL | Status: DC

## 2013-07-15 MED ORDER — HYDROCHLOROTHIAZIDE 25 MG PO TABS: 25.0000 mg | ORAL_TABLET | Freq: Every day | ORAL | Status: DC

## 2013-07-15 NOTE — Progress Notes (Signed)
Patient ID: CAFFIE SOTTO, female   DOB: August 04, 1975, 38 y.o.   MRN: [id number]  Tommi Rumps, MD Phone: [phone]  Rita Lamb is a 38 y.o. female who presents today for HTN and HLD f/u. Also to discuss growth on nose.  HYPERTENSION Disease Monitoring Home BP Monitoring checking, 130's/70's Chest pain- no    Dyspnea- no Medications Compliance-  taking.  Edema- no  Skin growth: on nose. Since the birth of her sone 3 years ago. Has not been changing. Has not been growing. Notes it is irritating. States these usually fall off, though this one has not.  Patient is a nonsmoker. ROS: Per HPI   Physical Exam Filed Vitals:   07/15/13 0851  BP: 131/84  Pulse: 72  Temp: 99.5 F (37.5 C)    Physical Examination: General appearance - alert, well appearing, and in no distress Chest - clear to auscultation, no wheezes, rales or rhonchi, symmetric air entry Heart - normal rate, regular rhythm, normal S1, S2, no murmurs, rubs, clicks or gallops Extremities - no pedal edema noted Skin - nose with skin colored growth with minimal stalk   Skin Biopsy Procedure Note  Pre-operative Diagnosis: skin tag  Post-operative Diagnosis: same  Locations:right nose  Indications: discomfort  Anesthesia: emla cream   Procedure Details  History of allergy to iodine: no  Patient informed of the risks (including bleeding and infection) and benefits of the  procedure and Written informed consent obtained.  The lesion and surrounding area were given a sterile prep using alcohol and draped in the usual sterile fashion. Scissors were used to cut an area of skin approximately 2 mm by 1 mm.  Hemostasis achieved with pressure dressing. A sterile dressing applied.  The specimen was sent for pathologic examination. The patient tolerated the procedure well.  EBL: 1 ml  Condition: Stable  Complications: none.  Plan: 1. Instructed to keep the wound dry and covered for 24-48h and clean  thereafter. 2. Warning signs of infection were reviewed.   4. Return prn  Assessment/Plan: Please see individual problem list.

## 2013-07-15 NOTE — Patient Instructions (Addendum)
Nice to see you. Please continue to take your medications for your blood pressure. Please check your blood pressure each day and keep a record of this.  Biopsy Care After Refer to this sheet in the next few weeks. These instructions provide you with information on caring for yourself after your procedure. Your caregiver may also give you more specific instructions. Your treatment has been planned according to current medical practices, but problems sometimes occur. Call your caregiver if you have any problems or questions after your procedure. If you had a fine needle biopsy, you may have soreness at the biopsy site for 1 to 2 days. If you had an open biopsy, you may have soreness at the biopsy site for 3 to 4 days. HOME CARE INSTRUCTIONS   You may resume normal diet and activities as directed.  Change bandages (dressings) as directed. If your wound was closed with a skin glue (adhesive), it will wear off and begin to peel in 7 days.  Only take over-the-counter or prescription medicines for pain, discomfort, or fever as directed by your caregiver.  Ask your caregiver when you can bathe and get your wound wet. SEEK IMMEDIATE MEDICAL CARE IF:   You have increased bleeding (more than a small spot) from the biopsy site.  You notice redness, swelling, or increasing pain at the biopsy site.  You have pus coming from the biopsy site.  You have a fever.  You notice a bad smell coming from the biopsy site or dressing.  You have a rash, have difficulty breathing, or have any allergic problems. MAKE SURE YOU:   Understand these instructions.  Will watch your condition.  Will get help right away if you are not doing well or get worse. Document Released: 11/12/2004 Document Revised: 07/18/2011 Document Reviewed: 10/21/2010 [facility] Patient Information 2014 [place].

## 2013-07-15 NOTE — Assessment & Plan Note (Signed)
At goal. Will continue current therapy. F/u in one month.

## 2013-07-15 NOTE — Assessment & Plan Note (Signed)
Skin growth on nose. ?skin tag vs benign nevus. Removed today. F/u pathology.

## 2013-07-17 ENCOUNTER — Encounter: Payer: Self-pay | Admitting: Family Medicine

## 2013-07-26 ENCOUNTER — Other Ambulatory Visit: Payer: Self-pay | Admitting: Family Medicine

## 2013-08-12 ENCOUNTER — Encounter: Payer: Self-pay | Admitting: Family Medicine

## 2013-08-12 ENCOUNTER — Ambulatory Visit (INDEPENDENT_AMBULATORY_CARE_PROVIDER_SITE_OTHER): Payer: BC Managed Care – PPO | Admitting: Family Medicine

## 2013-08-12 VITALS — BP 127/83 | HR 76 | Temp 98.4°F | Wt 258.0 lb

## 2013-08-12 DIAGNOSIS — J4520 Mild intermittent asthma, uncomplicated: Secondary | ICD-10-CM

## 2013-08-12 DIAGNOSIS — J45909 Unspecified asthma, uncomplicated: Secondary | ICD-10-CM

## 2013-08-12 DIAGNOSIS — I10 Essential (primary) hypertension: Secondary | ICD-10-CM

## 2013-08-12 DIAGNOSIS — D492 Neoplasm of unspecified behavior of bone, soft tissue, and skin: Secondary | ICD-10-CM

## 2013-08-12 NOTE — Assessment & Plan Note (Signed)
Biopsy site has healed well. Lesion was benign hemangioma. No further f/u needed.

## 2013-08-12 NOTE — Patient Instructions (Signed)
Nice to see you. Please continue to take your blood pressure medication. Next time you come back do not eat so we can check your cholesterol.

## 2013-08-12 NOTE — Assessment & Plan Note (Signed)
Stable. No exacerbations. Removed ipratropium from the med list. To continue albuterol prn.

## 2013-08-12 NOTE — Progress Notes (Signed)
Patient ID: MAKYNLEE KRESSIN, female   DOB: 09-Mar-1976, 38 y.o.   MRN: [id number]  Tommi Rumps, MD Phone: [phone]  CORRINNA KARAPETYAN is a 38 y.o. female who presents today for f/u/  HYPERTENSION Disease Monitoring Home BP Monitoring no Chest pain- no    Dyspnea- no Medications Compliance-  taking.  Edema- no  Skin lesion: biopsy site has been healing well. She states it was stubborn for a few days after the biopsy and kept bleeding. The lesion was found to be a benign hemangioma.  Asthma, mild intermittent: only has issues with this 1-2x/year when she gets a URI. That is the only time she uses the albuterol. She is not using the ipratropium. She denies night time awakenings.  Patient is a nonsmoker.  Medications reviewed and updated. Not taking ipratropium, metformin, or protonix.  ROS: Per HPI   Physical Exam Filed Vitals:   08/12/13 0912  BP: 127/83  Pulse: 76  Temp: 98.4 F (36.9 C)    Gen: Well NAD HEENT: EOMI,  MMM Lungs: CTABL Nl WOB Heart: RRR no MRG Exts: Non edematous BL  LE, warm and well perfused.  Skin: nasal tip biopsy site has healed well, no scaring, no sign of regrowth of hemangioma   Assessment/Plan: Please see individual problem list.

## 2013-08-12 NOTE — Assessment & Plan Note (Signed)
At goal. Will continue current medication regimen. F/u in 3 months. 

## 2013-09-13 ENCOUNTER — Other Ambulatory Visit: Payer: Self-pay | Admitting: Family Medicine

## 2013-10-10 ENCOUNTER — Emergency Department (HOSPITAL_COMMUNITY)
Admission: EM | Admit: 2013-10-10 | Discharge: 2013-10-11 | Disposition: A | Discharge status: Home / Self Care | Payer: BC Managed Care – PPO | Attending: Emergency Medicine | Admitting: Emergency Medicine

## 2013-10-10 ENCOUNTER — Emergency Department (HOSPITAL_COMMUNITY): Payer: BC Managed Care – PPO

## 2013-10-10 ENCOUNTER — Encounter (HOSPITAL_COMMUNITY): Payer: Self-pay | Admitting: Emergency Medicine

## 2013-10-10 DIAGNOSIS — Z8619 Personal history of other infectious and parasitic diseases: Secondary | ICD-10-CM | POA: Insufficient documentation

## 2013-10-10 DIAGNOSIS — M7918 Myalgia, other site: Secondary | ICD-10-CM

## 2013-10-10 DIAGNOSIS — S79929A Unspecified injury of unspecified thigh, initial encounter: Secondary | ICD-10-CM

## 2013-10-10 DIAGNOSIS — Y9289 Other specified places as the place of occurrence of the external cause: Secondary | ICD-10-CM | POA: Insufficient documentation

## 2013-10-10 DIAGNOSIS — W010XXA Fall on same level from slipping, tripping and stumbling without subsequent striking against object, initial encounter: Secondary | ICD-10-CM | POA: Insufficient documentation

## 2013-10-10 DIAGNOSIS — M1710 Unilateral primary osteoarthritis, unspecified knee: Secondary | ICD-10-CM | POA: Insufficient documentation

## 2013-10-10 DIAGNOSIS — Z79899 Other long term (current) drug therapy: Secondary | ICD-10-CM | POA: Insufficient documentation

## 2013-10-10 DIAGNOSIS — I10 Essential (primary) hypertension: Secondary | ICD-10-CM | POA: Insufficient documentation

## 2013-10-10 DIAGNOSIS — J45909 Unspecified asthma, uncomplicated: Secondary | ICD-10-CM | POA: Insufficient documentation

## 2013-10-10 DIAGNOSIS — S79919A Unspecified injury of unspecified hip, initial encounter: Secondary | ICD-10-CM | POA: Insufficient documentation

## 2013-10-10 DIAGNOSIS — Y9389 Activity, other specified: Secondary | ICD-10-CM | POA: Insufficient documentation

## 2013-10-10 DIAGNOSIS — IMO0002 Reserved for concepts with insufficient information to code with codable children: Secondary | ICD-10-CM

## 2013-10-10 DIAGNOSIS — Z3202 Encounter for pregnancy test, result negative: Secondary | ICD-10-CM | POA: Insufficient documentation

## 2013-10-10 DIAGNOSIS — Z8669 Personal history of other diseases of the nervous system and sense organs: Secondary | ICD-10-CM | POA: Insufficient documentation

## 2013-10-10 DIAGNOSIS — Z8632 Personal history of gestational diabetes: Secondary | ICD-10-CM | POA: Insufficient documentation

## 2013-10-10 DIAGNOSIS — W19XXXA Unspecified fall, initial encounter: Secondary | ICD-10-CM

## 2013-10-10 DIAGNOSIS — S300XXA Contusion of lower back and pelvis, initial encounter: Secondary | ICD-10-CM | POA: Insufficient documentation

## 2013-10-10 LAB — POC URINE PREG, ED: PREG TEST UR: NEGATIVE

## 2013-10-10 MED ORDER — IBUPROFEN 400 MG PO TABS
800.0000 mg | ORAL_TABLET | Freq: Once | ORAL | Status: AC
  Administered 2013-10-10: 800 mg via ORAL
  Filled 2013-10-10: qty 4

## 2013-10-10 NOTE — ED Provider Notes (Signed)
CSN: [id number]     Arrival date & time 10/10/13  2014 History   None    This chart was scribed for non-physician practitioner, Vernie Murders, PA-C working with Blanchie Dessert, MD by Forrestine Him, ED Scribe. This patient was seen in room TR10C/TR10C and the patient's care was started at 10:21 PM.   Chief Complaint  Patient presents with  . Fall   The history is provided by the patient. No language interpreter was used.    HPI Comments: Rita Lamb is a 38 y.o. female with a PMHx of Bell's Palsy, Arthritis, and HTN who presents to the Emergency Department complaining of a fall that occurred yesterday morning around 10 AM. States she slipped on a wet surface and fell backwards onto her backside. No head trauma or LOC. Pt now c/o constant, moderate lower back pain which radiates down her left lower extremity. She has tried OTC Tylenol without any noticeable improvement. Pt is able to ambulate without any difficulty. At this time she denies any fever, chills, numbness, loss of sensation, nausea, vomiting, abdominal pain or paresthesia. No urinary or bowel incontinence. Pt is currently not on any blood thinners. She has no other pertinent past medical history. No other concerns this visit.   Past Medical History  Diagnosis Date  . Bell's palsy   . Helicobacter pylori (H. pylori) infection   . Arthritis     bilateral knee  . Asthma   . Gestational diabetes   . Hypertension    Past Surgical History  Procedure Laterality Date  . Tonsillectomy and adenoidectomy    . Cryotherapy  1996    cervix   Family History  Problem Relation Age of Onset  . Diabetes Father   . Diabetes Sister   . Alcohol abuse Sister   . Colon cancer Paternal Grandfather   . Cancer Paternal Grandfather   . Stroke Paternal Grandfather   . Alcohol abuse Mother   . HIV Mother   . Diabetes Mother   . Dementia Maternal Grandmother   . Cancer Maternal Grandmother 87    colon,   . Cancer Maternal Grandfather    . Diabetes Maternal Grandfather   . Cancer Paternal Grandmother   . Heart disease Paternal Grandmother   . Diabetes Paternal Grandmother    History  Substance Use Topics  . Smoking status: Never Smoker   . Smokeless tobacco: Never Used  . Alcohol Use: No   OB History   Grav Para Term Preterm Abortions TAB SAB Ect Mult Living                 Review of Systems  Constitutional: Negative for fever, chills, activity change, appetite change and fatigue.  Eyes: Negative for photophobia and visual disturbance.  Respiratory: Negative for cough.   Cardiovascular: Negative for chest pain and leg swelling.  Gastrointestinal: Negative for nausea, vomiting and abdominal pain.  Musculoskeletal: Positive for arthralgias (L lower extremity) and back pain. Negative for gait problem and joint swelling.  Skin: Positive for color change. Negative for rash.  Neurological: Negative for dizziness, syncope, weakness, light-headedness, numbness and headaches.  Psychiatric/Behavioral: Negative for confusion.    Allergies  Sulfa antibiotics  Home Medications   Prior to Admission medications   Medication Sig Start Date End Date Taking? Authorizing Provider  acetaminophen (TYLENOL) 500 MG tablet Take 500 mg by mouth every 6 (six) hours as needed. For headaches     Historical Provider, MD  albuterol (PROVENTIL HFA;VENTOLIN HFA) 108 (90 BASE)  MCG/ACT inhaler Inhale 2 puffs into the lungs every 6 (six) hours as needed.    Historical Provider, MD  amLODipine (NORVASC) 10 MG tablet TAKE 1 TABLET BY MOUTH EVERY DAY 09/13/13   Leone Haven, MD  Blood Pressure Monitor DEVI 1 Device by Does not apply route once. 02/22/12   Leone Haven, MD  hydrochlorothiazide (MICROZIDE) 12.5 MG capsule TAKE 2 CAPSULES BY MOUTH DAILY 07/26/13   Leone Haven, MD  ibuprofen (ADVIL,MOTRIN) 200 MG tablet Take 200 mg by mouth every 6 (six) hours as needed.    Historical Provider, MD   Triage Vitals: BP 130/78  Pulse 80   Temp(Src) 98.5 F (36.9 C) (Oral)  Resp 18  SpO2 98%  LMP 09/09/2013   Filed Vitals:   10/10/13 2108 10/11/13 0030  BP: 130/78 139/96  Pulse: 80 68  Temp: 98.5 F (36.9 C)   TempSrc: Oral   Resp: 18 18  SpO2: 98% 99%    Physical Exam  Nursing note and vitals reviewed. Constitutional: She is oriented to person, place, and time. She appears well-developed and well-nourished. No distress.  HENT:  Head: Normocephalic and atraumatic.  Right Ear: External ear normal.  Left Ear: External ear normal.  Nose: Nose normal.  Mouth/Throat: Oropharynx is clear and moist. No oropharyngeal exudate.  Eyes: Conjunctivae and EOM are normal. Right eye exhibits no discharge. Left eye exhibits no discharge.  Neck: Normal range of motion. Neck supple.  Cardiovascular: Normal rate, regular rhythm and normal heart sounds.  Exam reveals no gallop and no friction rub.   No murmur heard. Dorsalis pedis pulses present and equal bilaterally  Pulmonary/Chest: Effort normal and breath sounds normal. No respiratory distress. She has no wheezes. She has no rales. She exhibits no tenderness.  Abdominal: Soft. She exhibits no distension. There is no tenderness.  Musculoskeletal: Normal range of motion. She exhibits tenderness. She exhibits no edema.       Legs: 10 cm x 5 cm area of ecchymosis to the left buttocks with associated diffuse tenderness. Tenderness to the left lower spine, left paraspinal muscles, left lateral hip, and sacrum. No tenderness to the left leg throughout. Strength 5/5 in the upper and lower extremities bilaterally. Patient able to ambulate without difficulty or ataxia. ROM intact in the LE throughout without limitations.   Neurological: She is alert and oriented to person, place, and time.  Patellar reflexes intact bilaterally. Sensation intact in the lower extremities.   Skin: Skin is warm and dry. She is not diaphoretic.  Psychiatric: She has a normal mood and affect.    ED Course   Procedures (including critical care time)  DIAGNOSTIC STUDIES: Oxygen Saturation is 98% on RA, Normal by my interpretation.    COORDINATION OF CARE: 10:28 PM- Will order X-Ray. Discussed treatment plan with pt at bedside and pt agreed to plan.     Labs Review Labs Reviewed  POC URINE PREG, ED    Imaging Review No results found.   EKG Interpretation None      Results for orders placed during the hospital encounter of 10/10/13  POC URINE PREG, ED      Result Value Ref Range   Preg Test, Ur NEGATIVE  NEGATIVE    DG Hip Complete Left (Final result)  Result time: 10/10/13 23:58:00    Final result by Rad Results In Interface (10/10/13 23:58:00)    Narrative:   CLINICAL DATA: Fall. Left-sided pelvic and tailbone pain. Left hip pain.  EXAM: LEFT HIP -  COMPLETE 2+ VIEW  COMPARISON: None.  FINDINGS: There is no evidence of hip fracture or dislocation. There is no evidence of arthropathy or other focal bone abnormality.  Small density projects to the right of the L3-L4 disc space which could reflect a ureteral stone but is more likely a phlebolith.  IMPRESSION: No fracture or dislocation.   Electronically Signed By: Lajean Manes M.D. On: 10/10/2013 23:58      DG Lumbar Spine Complete (Final result)  Result time: 10/10/13 23:56:26    Final result by Rad Results In Interface (10/10/13 23:56:26)    Narrative:   CLINICAL DATA: Fall. Back pain.  EXAM: LUMBAR SPINE - COMPLETE 4+ VIEW  COMPARISON: None.  FINDINGS: No fracture. No spondylolisthesis. There are minor endplate osteophytes. No other degenerative change. Soft tissues are unremarkable.  IMPRESSION: No fracture or acute finding.   Electronically Signed By: Lajean Manes M.D. On: 10/10/2013 23:56      DG Sacrum/Coccyx (Final result)  Result time: 10/10/13 23:59:14    Final result by Rad Results In Interface (10/10/13 23:59:14)    Narrative:   CLINICAL DATA: Fall. Tailbone  pain.  EXAM: SACRUM AND COCCYX - 2+ VIEW  COMPARISON: None.  FINDINGS: No fracture. No bone lesion. The SI joints are normally space and aligned. Small round density is seen lateral to the upper lumbar spine which may reflect intrarenal stone or phlebolith. Soft tissues otherwise unremarkable.  IMPRESSION: No fracture or acute finding.   Electronically Signed By: Lajean Manes M.D. On: 10/10/2013 23:59     MDM   Gaspar Bidding NAKAYLA RORABAUGH is a 38 y.o. female with a PMHx of Bell's Palsy, Arthritis, and HTN who presents to the Emergency Department complaining of a fall that occurred yesterday morning around 10 AM. Etiology of left buttocks pain likely due to contusion vs strain. No evidence of fx or malalignment on x-rays of the left hip, lumbar spine, or sacrum/coccyx. Patient neurovascularly intact with no focal neurological deficits. RICE method discussed. Return precautions, discharge instructions, and follow-up was discussed with the patient before discharge.     Discharge Medication List as of 10/11/2013 12:23 AM       Final impressions: 1. Fall   2. Left buttock pain       Mercy Moore PA-C   I personally performed the services described in this documentation, which was scribed in my presence. The recorded information has been reviewed and is accurate.    Lucila [place], PA-C 10/13/13 2136

## 2013-10-10 NOTE — ED Notes (Signed)
The pt fell yesterday she has pain in her lower back her lt leg and her lt foot  lmp last month

## 2013-10-10 NOTE — ED Notes (Signed)
Pt states now that she is concerned she could be pregnant, test ordered, xrays will be obtained after result

## 2013-10-11 NOTE — Discharge Instructions (Signed)
Take Tylenol or Ibuprofen as needed for pain  Return to the emergency department if you develop any changing/worsening condition, leg swelling, weakness, abdominal pain, loss of bowel/bladder function or any other concerns (please read additional information regarding your condition below)    Contusion A contusion is a deep bruise. Contusions are the result of an injury that caused bleeding under the skin. The contusion may turn blue, purple, or yellow. Minor injuries will give you a painless contusion, but more severe contusions may stay painful and swollen for a few weeks.  CAUSES  A contusion is usually caused by a blow, trauma, or direct force to an area of the body. SYMPTOMS   Swelling and redness of the injured area.  Bruising of the injured area.  Tenderness and soreness of the injured area.  Pain. DIAGNOSIS  The diagnosis can be made by taking a history and physical exam. An X-ray, CT scan, or MRI may be needed to determine if there were any associated injuries, such as fractures. TREATMENT  Specific treatment will depend on what area of the body was injured. In general, the best treatment for a contusion is resting, icing, elevating, and applying cold compresses to the injured area. Over-the-counter medicines may also be recommended for pain control. Ask your caregiver what the best treatment is for your contusion. HOME CARE INSTRUCTIONS   Put ice on the injured area.  Put ice in a plastic bag.  Place a towel between your skin and the bag.  Leave the ice on for 15-20 minutes, 03-04 times a day.  Only take over-the-counter or prescription medicines for pain, discomfort, or fever as directed by your caregiver. Your caregiver may recommend avoiding anti-inflammatory medicines (aspirin, ibuprofen, and naproxen) for 48 hours because these medicines may increase bruising.  Rest the injured area.  If possible, elevate the injured area to reduce swelling. SEEK IMMEDIATE MEDICAL  CARE IF:   You have increased bruising or swelling.  You have pain that is getting worse.  Your swelling or pain is not relieved with medicines. MAKE SURE YOU:   Understand these instructions.  Will watch your condition.  Will get help right away if you are not doing well or get worse. Document Released: 02/02/2005 Document Revised: 07/18/2011 Document Reviewed: 02/28/2011 [facility] Patient Information 2014 [place], [place].  RICE: Routine Care for Injuries The routine care of many injuries includes Rest, Ice, Compression, and Elevation (RICE). HOME CARE INSTRUCTIONS Rest is needed to allow your body to heal. Routine activities can usually be resumed when comfortable. Injured tendons and bones can take up to 6 weeks to heal. Tendons are the cord-like structures that attach muscle to bone. Ice following an injury helps keep the swelling down and reduces pain. Put ice in a plastic bag. Place a towel between your skin and the bag. Leave the ice on for 15-20 minutes, 03-04 times a day. Do this while awake, for the first 24 to 48 hours. After that, continue as directed by your caregiver. Compression helps keep swelling down. It also gives support and helps with discomfort. If an elastic bandage has been applied, it should be removed and reapplied every 3 to 4 hours. It should not be applied tightly, but firmly enough to keep swelling down. Watch fingers or toes for swelling, bluish discoloration, coldness, numbness, or excessive pain. If any of these problems occur, remove the bandage and reapply loosely. Contact your caregiver if these problems continue. Elevation helps reduce swelling and decreases pain. With extremities, such as  the arms, hands, legs, and feet, the injured area should be placed near or above the level of the heart, if possible. SEEK IMMEDIATE MEDICAL CARE IF: You have persistent pain and swelling. You develop redness, numbness, or unexpected weakness. Your symptoms are  getting worse rather than improving after several days. These symptoms may indicate that further evaluation or further X-rays are needed. Sometimes, X-rays may not show a small broken bone (fracture) until 1 week or 10 days later. Make a follow-up appointment with your caregiver. Ask when your X-ray results will be ready. Make sure you get your X-ray results. Document Released: 08/07/2000 Document Revised: 07/18/2011 Document Reviewed: 09/24/2010 [facility] Patient Information 2014 [place], [place].

## 2013-10-13 ENCOUNTER — Other Ambulatory Visit: Payer: Self-pay | Admitting: Family Medicine

## 2013-10-14 NOTE — ED Provider Notes (Signed)
Medical screening examination/treatment/procedure(s) were performed by non-physician practitioner and as supervising physician I was immediately available for consultation/collaboration.   EKG Interpretation None        Blanchie Dessert, MD 10/14/13 2112

## 2013-11-22 ENCOUNTER — Emergency Department (HOSPITAL_COMMUNITY)
Admission: EM | Admit: 2013-11-22 | Discharge: 2013-11-22 | Disposition: A | Discharge status: Home / Self Care | Payer: BC Managed Care – PPO | Source: Home / Self Care | Attending: Family Medicine | Admitting: Family Medicine

## 2013-11-22 ENCOUNTER — Emergency Department (INDEPENDENT_AMBULATORY_CARE_PROVIDER_SITE_OTHER): Payer: BC Managed Care – PPO

## 2013-11-22 ENCOUNTER — Encounter (HOSPITAL_COMMUNITY): Payer: Self-pay | Admitting: Emergency Medicine

## 2013-11-22 ENCOUNTER — Emergency Department (HOSPITAL_COMMUNITY)
Admission: EM | Admit: 2013-11-22 | Discharge: 2013-11-23 | Disposition: A | Discharge status: Home / Self Care | Payer: BC Managed Care – PPO | Attending: Emergency Medicine | Admitting: Emergency Medicine

## 2013-11-22 DIAGNOSIS — Z79899 Other long term (current) drug therapy: Secondary | ICD-10-CM | POA: Insufficient documentation

## 2013-11-22 DIAGNOSIS — J029 Acute pharyngitis, unspecified: Secondary | ICD-10-CM

## 2013-11-22 DIAGNOSIS — J45909 Unspecified asthma, uncomplicated: Secondary | ICD-10-CM | POA: Insufficient documentation

## 2013-11-22 DIAGNOSIS — I10 Essential (primary) hypertension: Secondary | ICD-10-CM | POA: Insufficient documentation

## 2013-11-22 DIAGNOSIS — R519 Headache, unspecified: Secondary | ICD-10-CM

## 2013-11-22 DIAGNOSIS — R509 Fever, unspecified: Secondary | ICD-10-CM

## 2013-11-22 DIAGNOSIS — Z8632 Personal history of gestational diabetes: Secondary | ICD-10-CM | POA: Insufficient documentation

## 2013-11-22 DIAGNOSIS — Z8619 Personal history of other infectious and parasitic diseases: Secondary | ICD-10-CM | POA: Insufficient documentation

## 2013-11-22 DIAGNOSIS — M129 Arthropathy, unspecified: Secondary | ICD-10-CM | POA: Insufficient documentation

## 2013-11-22 DIAGNOSIS — R51 Headache: Secondary | ICD-10-CM

## 2013-11-22 LAB — CBC
HCT: 38.8 % (ref 36.0–46.0)
Hemoglobin: 12.5 g/dL (ref 12.0–15.0)
MCH: 28.3 pg (ref 26.0–34.0)
MCHC: 32.2 g/dL (ref 30.0–36.0)
MCV: 88 fL (ref 78.0–100.0)
Platelets: 332 10*3/uL (ref 150–400)
RBC: 4.41 MIL/uL (ref 3.87–5.11)
RDW: 14.5 % (ref 11.5–15.5)
WBC: 18.3 10*3/uL — ABNORMAL HIGH (ref 4.0–10.5)

## 2013-11-22 LAB — POCT URINALYSIS DIP (DEVICE)
Bilirubin Urine: NEGATIVE
Glucose, UA: NEGATIVE mg/dL
Ketones, ur: NEGATIVE mg/dL
Nitrite: NEGATIVE
PH: 5.5 (ref 5.0–8.0)
PROTEIN: 30 mg/dL — AB
Urobilinogen, UA: 1 mg/dL (ref 0.0–1.0)

## 2013-11-22 LAB — POCT PREGNANCY, URINE
PREG TEST UR: NEGATIVE
Preg Test, Ur: NEGATIVE

## 2013-11-22 LAB — POCT I-STAT, CHEM 8
BUN: 7 mg/dL (ref 6–23)
CALCIUM ION: 1.1 mmol/L — AB (ref 1.12–1.23)
CREATININE: 0.8 mg/dL (ref 0.50–1.10)
Chloride: 98 mEq/L (ref 96–112)
Glucose, Bld: 94 mg/dL (ref 70–99)
HCT: 43 % (ref 36.0–46.0)
Hemoglobin: 14.6 g/dL (ref 12.0–15.0)
Potassium: 3.1 mEq/L — ABNORMAL LOW (ref 3.7–5.3)
Sodium: 138 mEq/L (ref 137–147)
TCO2: 28 mmol/L (ref 0–100)

## 2013-11-22 LAB — POCT RAPID STREP A: Streptococcus, Group A Screen (Direct): NEGATIVE

## 2013-11-22 MED ORDER — ACETAMINOPHEN 325 MG PO TABS
ORAL_TABLET | ORAL | Status: AC
  Filled 2013-11-22: qty 3

## 2013-11-22 NOTE — ED Notes (Signed)
The [facility] she has had pain in her entire back and a headache with an elevated temp since yesterday.  Her temp goes away for awhile then returns.  At [facility] she had blood drawn a strep test a chest xray and urine.  She thinks they told her that they did not find anything to treat there and sent her here.  Tylenol given there 30 minutes ago

## 2013-11-22 NOTE — ED Notes (Signed)
Fever. Dizziness.  And knee pain.  On set yesterday.  Denies vomiting and diarrhea.  No relief with ibuprofen.

## 2013-11-22 NOTE — ED Notes (Signed)
Pt asleep upon this RN entering the room.

## 2013-11-22 NOTE — ED Provider Notes (Signed)
Rita Lamb is a 38 y.o. female who presents to Urgent Care today for fever cough congestion sore throat headache and runny nose. She describes the headache as severe and is associated with neck pain and stiffness. This is also associated with leg pain and knee pain bilaterally. Symptoms started yesterday and worsened today. She has tried ibuprofen which have not helped. No vomiting or diarrhea. Trouble breathing. Patient has remote medical history for viral meningitis.   Past Medical History  Diagnosis Date  . Bell's palsy   . Helicobacter pylori (H. pylori) infection   . Arthritis     bilateral knee  . Asthma   . Gestational diabetes   . Hypertension    History  Substance Use Topics  . Smoking status: Never Smoker   . Smokeless tobacco: Never Used  . Alcohol Use: No   ROS as above Medications: No current facility-administered medications for this encounter.   Current Outpatient Prescriptions  Medication Sig Dispense Refill  . amLODipine (NORVASC) 10 MG tablet Take 10 mg by mouth daily.      . hydrochlorothiazide (HYDRODIURIL) 25 MG tablet TAKE 1 TABLET BY MOUTH EVERY DAY  90 tablet  0  . ibuprofen (ADVIL,MOTRIN) 200 MG tablet Take 200 mg by mouth every 6 (six) hours as needed for moderate pain.       Marland Kitchen acetaminophen (TYLENOL) 500 MG tablet Take 500 mg by mouth every 6 (six) hours as needed. For headaches       . albuterol (PROVENTIL HFA;VENTOLIN HFA) 108 (90 BASE) MCG/ACT inhaler Inhale 2 puffs into the lungs every 6 (six) hours as needed for wheezing or shortness of breath.       . Blood Pressure Monitor DEVI 1 Device by Does not apply route once.  1 Device  0    Exam:  BP 130/74  Pulse 92  Temp(Src) 102 F (38.9 C) (Oral)  Resp 20  SpO2 96%  LMP 11/01/2013 Gen: Well NAD HEENT: EOMI,  MMM clear nasal discharge. Difficult to visualize posterior pharynx. Tympanic membranes are partially occluded by cerumen bilaterally but no significant erythema is present. Lungs:  Normal work of breathing. CTABL Heart: RRR no MRG Abd: NABS, Soft. Nontender, Nondistended Exts: Brisk capillary refill, warm and well perfused.  Knees bilaterally: No effusion with normal range of motion and nontender. Neuro: Left-sided facial nerve paralysis secondary to Bell's palsy present. Neck: Some pain and reduced range of motion present.  Results for orders placed during the hospital encounter of 11/22/13 (from the past 24 hour(s))  CBC     Status: Abnormal   Collection Time    11/22/13  6:48 PM      Result Value Ref Range   WBC 18.3 (*) 4.0 - 10.5 K/uL   RBC 4.41  3.87 - 5.11 MIL/uL   Hemoglobin 12.5  12.0 - 15.0 g/dL   HCT 38.8  36.0 - 46.0 %   MCV 88.0  78.0 - 100.0 fL   MCH 28.3  26.0 - 34.0 pg   MCHC 32.2  30.0 - 36.0 g/dL   RDW 14.5  11.5 - 15.5 %   Platelets 332  150 - 400 K/uL  POCT URINALYSIS DIP (DEVICE)     Status: Abnormal   Collection Time    11/22/13  6:50 PM      Result Value Ref Range   Glucose, UA NEGATIVE  NEGATIVE mg/dL   Bilirubin Urine NEGATIVE  NEGATIVE   Ketones, ur NEGATIVE  NEGATIVE mg/dL   Specific  Gravity, Urine >=1.030  1.005 - 1.030   Hgb urine dipstick TRACE (*) NEGATIVE   pH 5.5  5.0 - 8.0   Protein, ur 30 (*) NEGATIVE mg/dL   Urobilinogen, UA 1.0  0.0 - 1.0 mg/dL   Nitrite NEGATIVE  NEGATIVE   Leukocytes, UA SMALL (*) NEGATIVE  POCT PREGNANCY, URINE     Status: None   Collection Time    11/22/13  6:53 PM      Result Value Ref Range   Preg Test, Ur NEGATIVE  NEGATIVE  POCT RAPID STREP A ([facility] CARE ONLY)     Status: None   Collection Time    11/22/13  6:54 PM      Result Value Ref Range   Streptococcus, Group A Screen (Direct) NEGATIVE  NEGATIVE  POCT I-STAT, CHEM 8     Status: Abnormal   Collection Time    11/22/13  6:54 PM      Result Value Ref Range   Sodium 138  137 - 147 mEq/L   Potassium 3.1 (*) 3.7 - 5.3 mEq/L   Chloride 98  96 - 112 mEq/L   BUN 7  6 - 23 mg/dL   Creatinine, Ser 0.80  0.50 - 1.10 mg/dL   Glucose,  Bld 94  70 - 99 mg/dL   Calcium, Ion 1.10 (*) 1.12 - 1.23 mmol/L   TCO2 28  0 - 100 mmol/L   Hemoglobin 14.6  12.0 - 15.0 g/dL   HCT 43.0  36.0 - 46.0 %  POCT PREGNANCY, URINE     Status: None   Collection Time    11/22/13  6:59 PM      Result Value Ref Range   Preg Test, Ur NEGATIVE  NEGATIVE   Dg Chest 2 View  11/22/2013   CLINICAL DATA:  Fever.  EXAM: CHEST  2 VIEW  COMPARISON:  May 01, 2011.  FINDINGS: The heart size and mediastinal contours are within normal limits. Both lungs are clear. No pneumothorax or pleural effusion is noted. The visualized skeletal structures are unremarkable.  IMPRESSION: No acute cardiopulmonary abnormality seen.   Electronically Signed   By: Sabino Dick M.D.   On: 11/22/2013 19:14    Assessment and Plan: 38 y.o. female with fever headache and neck pain. Patient additionally has leukocytosis with no other clear explanation. Concerning for meningitis. Plan to transfer to the emergency room for further evaluation and management. Urine culture pending.  Discussed warning signs or symptoms. Please see discharge instructions. Patient expresses understanding.   This note was created using Systems analyst. Any transcription errors are unintended.    Gregor Hams, MD 11/22/13 [phone]

## 2013-11-23 ENCOUNTER — Emergency Department (HOSPITAL_COMMUNITY): Payer: BC Managed Care – PPO

## 2013-11-23 MED ORDER — IBUPROFEN 800 MG PO TABS
800.0000 mg | ORAL_TABLET | Freq: Once | ORAL | Status: AC
  Administered 2013-11-23: 800 mg via ORAL
  Filled 2013-11-23: qty 1

## 2013-11-23 MED ORDER — SODIUM CHLORIDE 0.9 % IV BOLUS (SEPSIS)
1000.0000 mL | Freq: Once | INTRAVENOUS | Status: AC
  Administered 2013-11-23: 1000 mL via INTRAVENOUS

## 2013-11-23 MED ORDER — HYDROMORPHONE HCL PF 1 MG/ML IJ SOLN
1.0000 mg | Freq: Once | INTRAMUSCULAR | Status: AC
  Administered 2013-11-23: 1 mg via INTRAVENOUS
  Filled 2013-11-23: qty 1

## 2013-11-23 MED ORDER — HYDROCODONE-ACETAMINOPHEN 7.5-325 MG/15ML PO SOLN: 15.0000 mL | Freq: Four times a day (QID) | ORAL | Status: DC | PRN

## 2013-11-23 MED ORDER — IOHEXOL 300 MG/ML  SOLN
75.0000 mL | Freq: Once | INTRAMUSCULAR | Status: AC | PRN
  Administered 2013-11-23: 75 mL via INTRAVENOUS

## 2013-11-23 MED ORDER — DEXAMETHASONE SODIUM PHOSPHATE 10 MG/ML IJ SOLN
10.0000 mg | Freq: Once | INTRAMUSCULAR | Status: AC
  Administered 2013-11-23: 10 mg via INTRAVENOUS
  Filled 2013-11-23: qty 1

## 2013-11-23 NOTE — ED Notes (Signed)
Pt has returned from radiology.  

## 2013-11-23 NOTE — Discharge Instructions (Signed)
Alternate tylenol and motrin every 4 hours for body pain and fevers.  HYCET contains tylenol, so do not double dose!.  Warm salt water gargles, rest, increased fluid and soft diet until feeling better.  Return to the ER for worsening condition or new concerning symptoms.   Salt Water Gargle This solution will help make your mouth and throat feel better. HOME CARE INSTRUCTIONS   Mix 1 teaspoon of salt in 8 ounces of warm water.  Gargle with this solution as much or often as you need or as directed. Swish and gargle gently if you have any sores or wounds in your mouth.  Do not swallow this mixture. Document Released: 01/28/2004 Document Revised: 07/18/2011 Document Reviewed: 06/20/2008 [facility] Patient Information 2015 [place], [place]. This information is not intended to replace advice given to you by your health care provider. Make sure you discuss any questions you have with your health care provider.  Pharyngitis Pharyngitis is redness, pain, and swelling (inflammation) of your pharynx.  CAUSES  Pharyngitis is usually caused by infection. Most of the time, these infections are from viruses (viral) and are part of a cold. However, sometimes pharyngitis is caused by bacteria (bacterial). Pharyngitis can also be caused by allergies. Viral pharyngitis may be spread from person to person by coughing, sneezing, and personal items or utensils (cups, forks, spoons, toothbrushes). Bacterial pharyngitis may be spread from person to person by more intimate contact, such as kissing.  SIGNS AND SYMPTOMS  Symptoms of pharyngitis include:   Sore throat.   Tiredness (fatigue).   Low-grade fever.   Headache.  Joint pain and muscle aches.  Skin rashes.  Swollen lymph nodes.  Plaque-like film on throat or tonsils (often seen with bacterial pharyngitis). DIAGNOSIS  Your health care provider will ask you questions about your illness and your symptoms. Your medical history, along with a physical  exam, is often all that is needed to diagnose pharyngitis. Sometimes, a rapid strep test is done. Other lab tests may also be done, depending on the suspected cause.  TREATMENT  Viral pharyngitis will usually get better in 3-4 days without the use of medicine. Bacterial pharyngitis is treated with medicines that kill germs (antibiotics).  HOME CARE INSTRUCTIONS   Drink enough water and fluids to keep your urine clear or pale yellow.   Only take over-the-counter or prescription medicines as directed by your health care provider:   If you are prescribed antibiotics, make sure you finish them even if you start to feel better.   Do not take aspirin.   Get lots of rest.   Gargle with 8 oz of salt water ( tsp of salt per 1 qt of water) as often as every 1-2 hours to soothe your throat.   Throat lozenges (if you are not at risk for choking) or sprays may be used to soothe your throat. SEEK MEDICAL CARE IF:   You have large, tender lumps in your neck.  You have a rash.  You cough up green, yellow-brown, or bloody spit. SEEK IMMEDIATE MEDICAL CARE IF:   Your neck becomes stiff.  You drool or are unable to swallow liquids.  You vomit or are unable to keep medicines or liquids down.  You have severe pain that does not go away with the use of recommended medicines.  You have trouble breathing (not caused by a stuffy nose). MAKE SURE YOU:   Understand these instructions.  Will watch your condition.  Will get help right away if you are not  doing well or get worse. Document Released: 04/25/2005 Document Revised: 02/13/2013 Document Reviewed: 12/31/2012 [facility] Patient Information 2015 [place], [place]. This information is not intended to replace advice given to you by your health care provider. Make sure you discuss any questions you have with your health care provider.  Viral Infections A viral infection can be caused by different types of viruses.Most viral infections are  not serious and resolve on their own. However, some infections may cause severe symptoms and may lead to further complications. SYMPTOMS Viruses can frequently cause:  Minor sore throat.  Aches and pains.  Headaches.  Runny nose.  Different types of rashes.  Watery eyes.  Tiredness.  Cough.  Loss of appetite.  Gastrointestinal infections, resulting in nausea, vomiting, and diarrhea. These symptoms do not respond to antibiotics because the infection is not caused by bacteria. However, you might catch a bacterial infection following the viral infection. This is sometimes called a "superinfection." Symptoms of such a bacterial infection may include:  Worsening sore throat with pus and difficulty swallowing.  Swollen neck glands.  Chills and a high or persistent fever.  Severe headache.  Tenderness over the sinuses.  Persistent overall ill feeling (malaise), muscle aches, and tiredness (fatigue).  Persistent cough.  Yellow, green, or brown mucus production with coughing. HOME CARE INSTRUCTIONS   Only take over-the-counter or prescription medicines for pain, discomfort, diarrhea, or fever as directed by your caregiver.  Drink enough water and fluids to keep your urine clear or pale yellow. Sports drinks can provide valuable electrolytes, sugars, and hydration.  Get plenty of rest and maintain proper nutrition. Soups and broths with crackers or rice are fine. SEEK IMMEDIATE MEDICAL CARE IF:   You have severe headaches, shortness of breath, chest pain, neck pain, or an unusual rash.  You have uncontrolled vomiting, diarrhea, or you are unable to keep down fluids.  You or your child has an oral temperature above 102 F (38.9 C), not controlled by medicine.  Your baby is older than 3 months with a rectal temperature of 102 F (38.9 C) or higher.  Your baby is 60 months old or younger with a rectal temperature of 100.4 F (38 C) or higher. MAKE SURE YOU:    Understand these instructions.  Will watch your condition.  Will get help right away if you are not doing well or get worse. Document Released: 02/02/2005 Document Revised: 07/18/2011 Document Reviewed: 08/30/2010 [facility] Patient Information 2015 [place], [place]. This information is not intended to replace advice given to you by your health care provider. Make sure you discuss any questions you have with your health care provider.

## 2013-11-23 NOTE — ED Provider Notes (Signed)
CSN: [id number]     Arrival date & time 11/22/13  1954 History   First MD Initiated Contact with Patient 11/23/13 0024     Chief Complaint  Patient presents with  . multiple complaints      (Consider location/radiation/quality/duration/timing/severity/associated sxs/prior Treatment) HPI 38 year old female presents to emergency department as a transfer from urgent care Center with fever bodyaches and sore throat.  There was concern of possible meningitis and she was transferred from urgent care for further evaluation.  Patient reports yesterday around 1 PM she developed bilateral knee pain and lower back pain.  She reports she felt heavy and fatigued.  She reports she took 600 of Motrin with some improvement in symptoms.  Patient reports she felt well during the day, but as the day went on she felt worse.  She reports nasal congestion, eye drainage, continued achiness, fever and chills, and a severe sore throat.  She reports difficulty swallowing.  Pain with palpation of her throat.  Patient does report some neck tenderness she reports pain from the top of her head to the base of her spine.  Patient reports prior history of viral meningitis, and reports that her current symptoms are not like her prior meningitis.  She reports pain with movement of her head and neck, but not severe.  No recent tick bites, no sick contacts.  Patient works in a daycare. Past Medical History  Diagnosis Date  . Bell's palsy   . Helicobacter pylori (H. pylori) infection   . Arthritis     bilateral knee  . Asthma   . Gestational diabetes   . Hypertension    Past Surgical History  Procedure Laterality Date  . Tonsillectomy and adenoidectomy    . Cryotherapy  1996    cervix   Family History  Problem Relation Age of Onset  . Diabetes Father   . Diabetes Sister   . Alcohol abuse Sister   . Colon cancer Paternal Grandfather   . Cancer Paternal Grandfather   . Stroke Paternal Grandfather   . Alcohol abuse  Mother   . HIV Mother   . Diabetes Mother   . Dementia Maternal Grandmother   . Cancer Maternal Grandmother 31    colon,   . Cancer Maternal Grandfather   . Diabetes Maternal Grandfather   . Cancer Paternal Grandmother   . Heart disease Paternal Grandmother   . Diabetes Paternal Grandmother    History  Substance Use Topics  . Smoking status: Never Smoker   . Smokeless tobacco: Never Used  . Alcohol Use: No   OB History   Grav Para Term Preterm Abortions TAB SAB Ect Mult Living                 Review of Systems   See History of Present Illness; otherwise all other systems are reviewed and negative  Allergies  Sulfa antibiotics  Home Medications   Prior to Admission medications   Medication Sig Start Date End Date Taking? Authorizing Provider  acetaminophen (TYLENOL) 500 MG tablet Take 500 mg by mouth every 6 (six) hours as needed. For headaches    Yes Historical Provider, MD  albuterol (PROVENTIL HFA;VENTOLIN HFA) 108 (90 BASE) MCG/ACT inhaler Inhale 2 puffs into the lungs every 6 (six) hours as needed for wheezing or shortness of breath.    Yes Historical Provider, MD  amLODipine (NORVASC) 10 MG tablet Take 10 mg by mouth daily.   Yes Historical Provider, MD  hydrochlorothiazide (HYDRODIURIL) 25 MG tablet TAKE  1 TABLET BY MOUTH EVERY DAY   Yes Leone Haven, MD  ibuprofen (ADVIL,MOTRIN) 200 MG tablet Take 200 mg by mouth every 6 (six) hours as needed for moderate pain.    Yes Historical Provider, MD  Blood Pressure Monitor DEVI 1 Device by Does not apply route once. 02/22/12   Leone Haven, MD  HYDROcodone-acetaminophen (HYCET) 7.5-325 mg/15 ml solution Take 15 mLs by mouth 4 (four) times daily as needed for moderate pain. 11/23/13 11/23/14  Kalman Drape, MD   BP 115/63  Pulse 93  Temp(Src) 100 F (37.8 C) (Oral)  Resp 16  Ht 5\' 3"  (1.6 m)  Wt 254 lb (115.214 kg)  BMI 45.01 kg/m2  SpO2 92%  LMP 11/01/2013 Physical Exam  Nursing note and vitals  reviewed. Constitutional: She is oriented to person, place, and time. She appears well-developed and well-nourished.  HENT:  Head: Normocephalic and atraumatic.  Right Ear: External ear normal.  Left Ear: External ear normal.  Nose: Nose normal.  Mouth/Throat: Oropharynx is clear and moist.  Eyes: Conjunctivae and EOM are normal. Pupils are equal, round, and reactive to light.  Neck: Normal range of motion. Neck supple. No JVD present. No tracheal deviation present. No thyromegaly present.  Posterior pharynx erythematous.  There is lymphadenopathy with tenderness to palpation.  Neck is supple.  She has pain with palpation of the paraspinal muscles.  There is no nuchal rigidity.  She has some mild tenderness with movement of the neck, negative Brudzinski's and Kernig's  Cardiovascular: Normal rate, regular rhythm, normal heart sounds and intact distal pulses.  Exam reveals no gallop and no friction rub.   No murmur heard. Pulmonary/Chest: Effort normal and breath sounds normal. No stridor. No respiratory distress. She has no wheezes. She has no rales. She exhibits no tenderness.  Abdominal: Soft. Bowel sounds are normal. She exhibits no distension and no mass. There is no tenderness. There is no rebound and no guarding.  Musculoskeletal: Normal range of motion. She exhibits no edema and no tenderness.  Lymphadenopathy:    She has cervical adenopathy.  Neurological: She is alert and oriented to person, place, and time. She has normal reflexes. No cranial nerve deficit. She exhibits normal muscle tone. Coordination normal.  Skin: Skin is warm and dry. No rash noted. No erythema. No pallor.  Psychiatric: She has a normal mood and affect. Her behavior is normal. Judgment and thought content normal.    ED Course  Procedures (including critical care time) Labs Review Labs Reviewed  CULTURE, BLOOD (ROUTINE X 2)  CULTURE, BLOOD (ROUTINE X 2)    Imaging Review Dg Chest 2 View  11/22/2013    CLINICAL DATA:  Fever.  EXAM: CHEST  2 VIEW  COMPARISON:  May 01, 2011.  FINDINGS: The heart size and mediastinal contours are within normal limits. Both lungs are clear. No pneumothorax or pleural effusion is noted. The visualized skeletal structures are unremarkable.  IMPRESSION: No acute cardiopulmonary abnormality seen.   Electronically Signed   By: Sabino Dick M.D.   On: 11/22/2013 19:14   Ct Soft Tissue Neck W Contrast  11/23/2013   CLINICAL DATA:  Severe neck pain, assess for epiglottitis.  EXAM: CT NECK WITH CONTRAST  TECHNIQUE: Multidetector CT imaging of the neck was performed using the standard protocol following the bolus administration of intravenous contrast.  CONTRAST:  67mL OMNIPAQUE IOHEXOL 300 MG/ML  SOLN  COMPARISON:  None.  FINDINGS: The epiglottis is not enlarged, preservation of pre epiglottic fat,  patent airway. Partially effaced left piriform sinus could reflect secretions. Normal appearance of pharyngeal soft tissues, preservation of the parapharyngeal fat tissue planes. Normal appearance of the larynx.  Jugulodigastric lymphadenopathy with homogeneous enhancement, reniform morphology, 17 mm short axis level 3 lymph node on the right, 10 mm short axis left level IIa lymph node. Prominent bilateral intra parotid lymph nodes. 12 mm short axis pretracheal lymph node.  Normal thyroid gland. No free fluid or fluid collections within the neck. Broad reversed cervical lordosis. Patient is nearly edentulous. Visualized paranasal sinuses and mastoid air cells are well aerated.  IMPRESSION: No CT findings of epiglottitis. No peritonsillar abscess, patent airway.  Cervical and partially imaged mediastinal lymphadenopathy could be reactive though, recommend clinical correlation.   Electronically Signed   By: Elon Alas   On: 11/23/2013 02:43     EKG Interpretation None      MDM   Final diagnoses:  Viral pharyngitis   38 year old female with fever, leukocytosis, myalgias,  severe throat pain.  Patient to me he reports that the pain in her throat is much worse than any pain in her back her neck.  Patient does not have significant rigidity on exam.  I am more concerned about possible epiglottitis given her presentation.  She does have a hot potato voice and is having difficulties with her secretions.  Will plan for CT soft tissue neck.  Will send blood cultures.  I suspect that this is a viral process, we'll also give Decadron for any posterior pharynx swelling.   4:19 AM Patient feeling much better.  She did have some sore throat with swallowing.  CT scan without epiglottitis.  She does have some lymphadenopathy.  Will have her followup with her primary care Dr. for recheck next week.  Kalman Drape, MD 11/23/13 [phone]

## 2013-11-24 LAB — URINE CULTURE

## 2013-11-24 LAB — CULTURE, GROUP A STREP

## 2013-11-29 LAB — CULTURE, BLOOD (ROUTINE X 2)
CULTURE: NO GROWTH
Culture: NO GROWTH

## 2013-12-13 ENCOUNTER — Other Ambulatory Visit: Payer: Self-pay | Admitting: Family Medicine

## 2014-01-20 ENCOUNTER — Encounter (HOSPITAL_COMMUNITY): Payer: Self-pay | Admitting: Emergency Medicine

## 2014-01-20 ENCOUNTER — Emergency Department (HOSPITAL_COMMUNITY)
Admission: EM | Admit: 2014-01-20 | Discharge: 2014-01-20 | Disposition: A | Discharge status: Home / Self Care | Payer: BC Managed Care – PPO | Attending: Emergency Medicine | Admitting: Emergency Medicine

## 2014-01-20 DIAGNOSIS — Z8619 Personal history of other infectious and parasitic diseases: Secondary | ICD-10-CM | POA: Diagnosis not present

## 2014-01-20 DIAGNOSIS — S99929A Unspecified injury of unspecified foot, initial encounter: Secondary | ICD-10-CM

## 2014-01-20 DIAGNOSIS — S99919A Unspecified injury of unspecified ankle, initial encounter: Secondary | ICD-10-CM

## 2014-01-20 DIAGNOSIS — Y9389 Activity, other specified: Secondary | ICD-10-CM | POA: Insufficient documentation

## 2014-01-20 DIAGNOSIS — IMO0002 Reserved for concepts with insufficient information to code with codable children: Secondary | ICD-10-CM | POA: Insufficient documentation

## 2014-01-20 DIAGNOSIS — Y92410 Unspecified street and highway as the place of occurrence of the external cause: Secondary | ICD-10-CM | POA: Diagnosis not present

## 2014-01-20 DIAGNOSIS — Z79899 Other long term (current) drug therapy: Secondary | ICD-10-CM | POA: Diagnosis not present

## 2014-01-20 DIAGNOSIS — Z8669 Personal history of other diseases of the nervous system and sense organs: Secondary | ICD-10-CM | POA: Insufficient documentation

## 2014-01-20 DIAGNOSIS — J45909 Unspecified asthma, uncomplicated: Secondary | ICD-10-CM | POA: Insufficient documentation

## 2014-01-20 DIAGNOSIS — S8990XA Unspecified injury of unspecified lower leg, initial encounter: Secondary | ICD-10-CM | POA: Diagnosis present

## 2014-01-20 DIAGNOSIS — Z8632 Personal history of gestational diabetes: Secondary | ICD-10-CM | POA: Insufficient documentation

## 2014-01-20 DIAGNOSIS — I10 Essential (primary) hypertension: Secondary | ICD-10-CM | POA: Insufficient documentation

## 2014-01-20 DIAGNOSIS — M129 Arthropathy, unspecified: Secondary | ICD-10-CM | POA: Insufficient documentation

## 2014-01-20 DIAGNOSIS — T148XXA Other injury of unspecified body region, initial encounter: Secondary | ICD-10-CM

## 2014-01-20 MED ORDER — IBUPROFEN 800 MG PO TABS
800.0000 mg | ORAL_TABLET | Freq: Three times a day (TID) | ORAL | Status: DC
Start: 2014-01-20 — End: 2015-02-08

## 2014-01-20 MED ORDER — IBUPROFEN 200 MG PO TABS
600.0000 mg | ORAL_TABLET | Freq: Once | ORAL | Status: AC
  Administered 2014-01-20: 600 mg via ORAL
  Filled 2014-01-20 (×2): qty 1
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
## 2014-01-21 NOTE — ED Provider Notes (Signed)
Medical screening examination/treatment/procedure(s) were performed by non-physician practitioner and as supervising physician I was immediately available for consultation/collaboration.  Jericka Kadar L Jianni Batten, MD 01/21/14 0013 

## 2014-02-18 ENCOUNTER — Other Ambulatory Visit: Payer: Self-pay | Admitting: Family Medicine

## 2014-02-28 ENCOUNTER — Ambulatory Visit (INDEPENDENT_AMBULATORY_CARE_PROVIDER_SITE_OTHER): Payer: BC Managed Care – PPO | Admitting: Family Medicine

## 2014-02-28 ENCOUNTER — Encounter: Payer: Self-pay | Admitting: Family Medicine

## 2014-02-28 VITALS — BP 120/60 | HR 76 | Temp 97.9°F | Wt 250.0 lb

## 2014-02-28 DIAGNOSIS — I10 Essential (primary) hypertension: Secondary | ICD-10-CM | POA: Diagnosis not present

## 2014-02-28 DIAGNOSIS — H6120 Impacted cerumen, unspecified ear: Secondary | ICD-10-CM | POA: Insufficient documentation

## 2014-02-28 DIAGNOSIS — N926 Irregular menstruation, unspecified: Secondary | ICD-10-CM | POA: Diagnosis not present

## 2014-02-28 DIAGNOSIS — H6123 Impacted cerumen, bilateral: Secondary | ICD-10-CM | POA: Diagnosis not present

## 2014-02-28 LAB — BASIC METABOLIC PANEL
BUN: 11 mg/dL (ref 6–23)
CO2: 30 mEq/L (ref 19–32)
Calcium: 9.3 mg/dL (ref 8.4–10.5)
Chloride: 100 mEq/L (ref 96–112)
Creat: 0.72 mg/dL (ref 0.50–1.10)
GLUCOSE: 92 mg/dL (ref 70–99)
Potassium: 3.5 mEq/L (ref 3.5–5.3)
Sodium: 138 mEq/L (ref 135–145)

## 2014-02-28 LAB — LIPID PANEL
CHOL/HDL RATIO: 4.3 ratio
CHOLESTEROL: 176 mg/dL (ref 0–200)
HDL: 41 mg/dL (ref >39)
LDL Cholesterol: 115 mg/dL — ABNORMAL HIGH (ref 0–99)
Triglycerides: 100 mg/dL (ref <150)
VLDL: 20 mg/dL (ref 0–40)

## 2014-02-28 LAB — POCT URINE PREGNANCY: Preg Test, Ur: NEGATIVE

## 2014-02-28 NOTE — Assessment & Plan Note (Signed)
Pregnancy test negative. Will continue to monitor at this time. Patient does have signs of PCOS with obesity, hirsutism, and impaired glucose tolerance in the past. Will need to consider further work up if this persists.

## 2014-02-28 NOTE — Assessment & Plan Note (Signed)
At goal. Continue current medications. Check BMET and lipid panel today. F/u in 3 months.

## 2014-02-28 NOTE — Patient Instructions (Signed)
Nice to see you. Your pregnancy test is negative. We will call with the results of your blood work.

## 2014-02-28 NOTE — Progress Notes (Signed)
Patient ID: Rita Lamb, female   DOB: 06/25/1975, 38 y.o.   MRN: [id number]  Tommi Rumps, MD Phone: [phone]  Rita Lamb is a 38 y.o. female who presents today for f/u.  HYPERTENSION Disease Monitoring Home BP Monitoring not checking Chest pain- no    Dyspnea- no Medications Compliance-  taking.   Edema- no  Cerumen impaction: patient reports that she has lots of wax in her ears. She notes there is decreased hearing with this. This impaction is a recurrent issue. She has tried OTC drops and prescription drops that have not been helpful.   Patient also reports that she thinks she is pregnant. She notes she feels weird. She can not describe what "weird" means. She notes that her cycle is irregular and that she typically has a period the 3rd-4th week of the month, though did not have one in September. She notes she did have spotting for a couple of days starting 02/13/14. No bleeding since that time.   Patient is a nonsmoker.   ROS: Per HPI   Physical Exam Filed Vitals:   02/28/14 0956  BP: 120/60  Pulse: 76  Temp: 97.9 F (36.6 C)    Gen: Well NAD HEENT: PERRL,  MMM, bilateral TMs obscurred by cerumen, after cleaning by nursing bilateral TMs normal Lungs: CTABL Nl WOB Heart: RRR no MRG Abd: soft, NT, ND Exts: Non edematous BL  LE, warm and well perfused.    Assessment/Plan: Please see individual problem list.  # Healthcare maintenance: declined flu shot  Tommi Rumps, MD [place] [facility] PGY-3

## 2014-02-28 NOTE — Assessment & Plan Note (Signed)
Ears irrigated by nursing staff. Resolved impaction and patient reports hearing better after this.

## 2014-03-06 ENCOUNTER — Encounter: Payer: Self-pay | Admitting: Family Medicine

## 2014-05-19 ENCOUNTER — Other Ambulatory Visit: Payer: Self-pay | Admitting: Family Medicine

## 2014-05-21 ENCOUNTER — Other Ambulatory Visit: Payer: Self-pay | Admitting: Family Medicine

## 2014-07-03 ENCOUNTER — Ambulatory Visit (INDEPENDENT_AMBULATORY_CARE_PROVIDER_SITE_OTHER): Payer: 59 | Admitting: Family Medicine

## 2014-07-03 ENCOUNTER — Encounter: Payer: Self-pay | Admitting: Family Medicine

## 2014-07-03 VITALS — BP 123/74 | HR 61 | Temp 98.4°F | Ht 63.0 in | Wt 254.0 lb

## 2014-07-03 DIAGNOSIS — I10 Essential (primary) hypertension: Secondary | ICD-10-CM

## 2014-07-03 DIAGNOSIS — R7302 Impaired glucose tolerance (oral): Secondary | ICD-10-CM

## 2014-07-03 DIAGNOSIS — H6123 Impacted cerumen, bilateral: Secondary | ICD-10-CM

## 2014-07-03 LAB — POCT GLYCOSYLATED HEMOGLOBIN (HGB A1C): Hemoglobin A1C: 5.4

## 2014-07-03 NOTE — Assessment & Plan Note (Addendum)
Ears flushed by nursing. Normal TMs noted after flush and hearing improved to baseline. Advised on mineral oil to help keep wax soft.

## 2014-07-03 NOTE — Assessment & Plan Note (Signed)
At goal. Continue current medications. 

## 2014-07-03 NOTE — Assessment & Plan Note (Signed)
No polyuria or polydipsia. Over all feels well. A1c today.

## 2014-07-03 NOTE — Patient Instructions (Signed)
Nice to see you. Please continue to take your medications as directed.  We will call with the results of the A1c (diabetes test).

## 2014-07-03 NOTE — Progress Notes (Signed)
Patient ID: Rita Lamb, female   DOB: Jul 18, 1975, 39 y.o.   MRN: [id number]  Tommi Rumps, MD Phone: [phone]  Rita Lamb is a 39 y.o. female who presents today for f/u.  HYPERTENSION Disease Monitoring Home BP Monitoring not checking Chest pain- no    Dyspnea- no Medications Compliance-  Taking HCTZ and amlodipine.   Edema- no  Cerumen impaction: reports recurrence of wax build up. Started earlier this month in the right ear with mildly diminished hearing then left ear. No ear pain or tinnitus. Has tried OTC meds in the past with no benefit.   Glucose intolerance: patient with history of GDM. Last A1c 5.3. Patient with hirsutism and obesity. Question of PCOS in the past. No polyuria or polydipsia. 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 Eats mostly baked meats and fresh frozen or fresh or no salt canned veggies.    Patient is a nonsmoker.   ROS: Per HPI   Physical Exam Filed Vitals:   07/03/14 0838  BP: 123/74  Pulse: 61  Temp: 98.4 F (36.9 C)    Gen: Well NAD HEENT: PERRL,  MMM, cerumen in bilateral ear canals, no cervical LAD Lungs: CTABL Nl WOB Heart: RRR  Exts: Non edematous BL  LE, warm and well perfused.    Assessment/Plan: Please see individual problem list.  # Healthcare maintenance: will need to return for pap smear  Tommi Rumps, MD [place] PGY-3

## 2014-09-03 ENCOUNTER — Emergency Department (HOSPITAL_COMMUNITY): Payer: 59

## 2014-09-03 ENCOUNTER — Encounter (HOSPITAL_COMMUNITY): Payer: Self-pay | Admitting: Emergency Medicine

## 2014-09-03 ENCOUNTER — Emergency Department (HOSPITAL_COMMUNITY)
Admission: EM | Admit: 2014-09-03 | Discharge: 2014-09-03 | Disposition: A | Discharge status: Home / Self Care | Payer: 59 | Attending: Emergency Medicine | Admitting: Emergency Medicine

## 2014-09-03 DIAGNOSIS — S2001XA Contusion of right breast, initial encounter: Secondary | ICD-10-CM | POA: Diagnosis not present

## 2014-09-03 DIAGNOSIS — S1081XA Abrasion of other specified part of neck, initial encounter: Secondary | ICD-10-CM | POA: Diagnosis not present

## 2014-09-03 DIAGNOSIS — S199XXA Unspecified injury of neck, initial encounter: Secondary | ICD-10-CM | POA: Diagnosis present

## 2014-09-03 DIAGNOSIS — Y998 Other external cause status: Secondary | ICD-10-CM | POA: Insufficient documentation

## 2014-09-03 DIAGNOSIS — M1990 Unspecified osteoarthritis, unspecified site: Secondary | ICD-10-CM | POA: Diagnosis not present

## 2014-09-03 DIAGNOSIS — S4991XA Unspecified injury of right shoulder and upper arm, initial encounter: Secondary | ICD-10-CM | POA: Insufficient documentation

## 2014-09-03 DIAGNOSIS — M25511 Pain in right shoulder: Secondary | ICD-10-CM

## 2014-09-03 DIAGNOSIS — S20111A Abrasion of breast, right breast, initial encounter: Secondary | ICD-10-CM | POA: Diagnosis not present

## 2014-09-03 DIAGNOSIS — N6489 Other specified disorders of breast: Secondary | ICD-10-CM

## 2014-09-03 DIAGNOSIS — Y92410 Unspecified street and highway as the place of occurrence of the external cause: Secondary | ICD-10-CM | POA: Insufficient documentation

## 2014-09-03 DIAGNOSIS — Z79899 Other long term (current) drug therapy: Secondary | ICD-10-CM | POA: Diagnosis not present

## 2014-09-03 DIAGNOSIS — Y9389 Activity, other specified: Secondary | ICD-10-CM | POA: Diagnosis not present

## 2014-09-03 DIAGNOSIS — Z791 Long term (current) use of non-steroidal anti-inflammatories (NSAID): Secondary | ICD-10-CM | POA: Insufficient documentation

## 2014-09-03 DIAGNOSIS — I10 Essential (primary) hypertension: Secondary | ICD-10-CM | POA: Insufficient documentation

## 2014-09-03 DIAGNOSIS — J45909 Unspecified asthma, uncomplicated: Secondary | ICD-10-CM | POA: Insufficient documentation

## 2014-09-03 DIAGNOSIS — Z8619 Personal history of other infectious and parasitic diseases: Secondary | ICD-10-CM | POA: Insufficient documentation

## 2014-09-03 DIAGNOSIS — Z8632 Personal history of gestational diabetes: Secondary | ICD-10-CM | POA: Diagnosis not present

## 2014-09-03 DIAGNOSIS — T07XXXA Unspecified multiple injuries, initial encounter: Secondary | ICD-10-CM

## 2014-09-03 MED ORDER — LIDOCAINE 5 % EX PTCH: 1.0000 | MEDICATED_PATCH | CUTANEOUS | Status: DC

## 2014-09-03 MED ORDER — CYCLOBENZAPRINE HCL 5 MG PO TABS: 5.0000 mg | ORAL_TABLET | Freq: Three times a day (TID) | ORAL | Status: DC | PRN

## 2014-09-03 MED ORDER — NAPROXEN 500 MG PO TABS: 500.0000 mg | ORAL_TABLET | Freq: Two times a day (BID) | ORAL | Status: DC

## 2014-09-03 MED ORDER — HYDROCODONE-ACETAMINOPHEN 5-325 MG PO TABS: 1.0000 | ORAL_TABLET | ORAL | Status: DC | PRN

## 2014-09-03 MED ORDER — IBUPROFEN 400 MG PO TABS
400.0000 mg | ORAL_TABLET | Freq: Once | ORAL | Status: AC
  Administered 2014-09-03: 400 mg via ORAL
  Filled 2014-09-03: qty 1
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
## 2014-09-03 NOTE — Discharge Instructions (Signed)
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
## 2015-01-07 IMAGING — CR DG HIP (WITH OR WITHOUT PELVIS) 2-3V*L*
3 series · 3 of 3 positions shown · non-contrast
Comparison: None.

CLINICAL DATA: Fall. Left-sided pelvic and tailbone pain. Left hip
pain.

EXAM:
LEFT HIP - COMPLETE 2+ VIEW

[t pelvis a.p.]
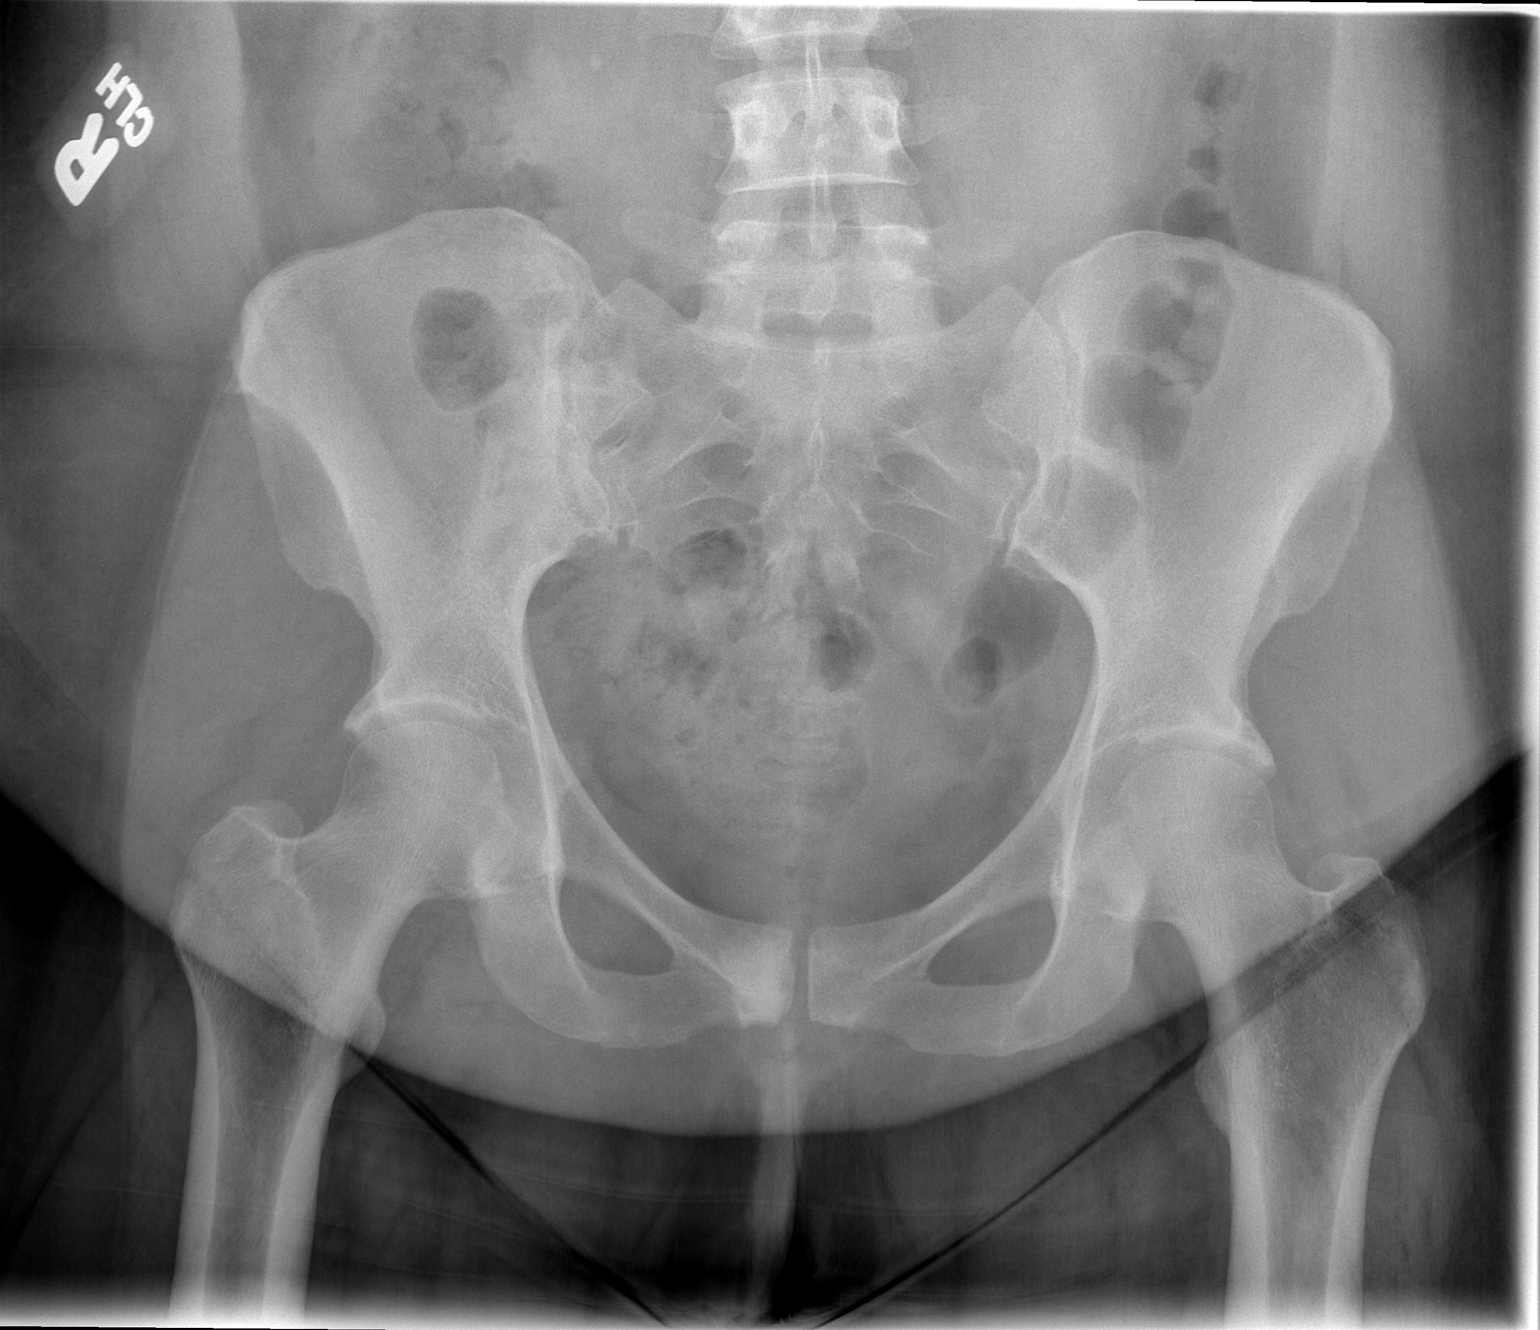

[t hip ap left]
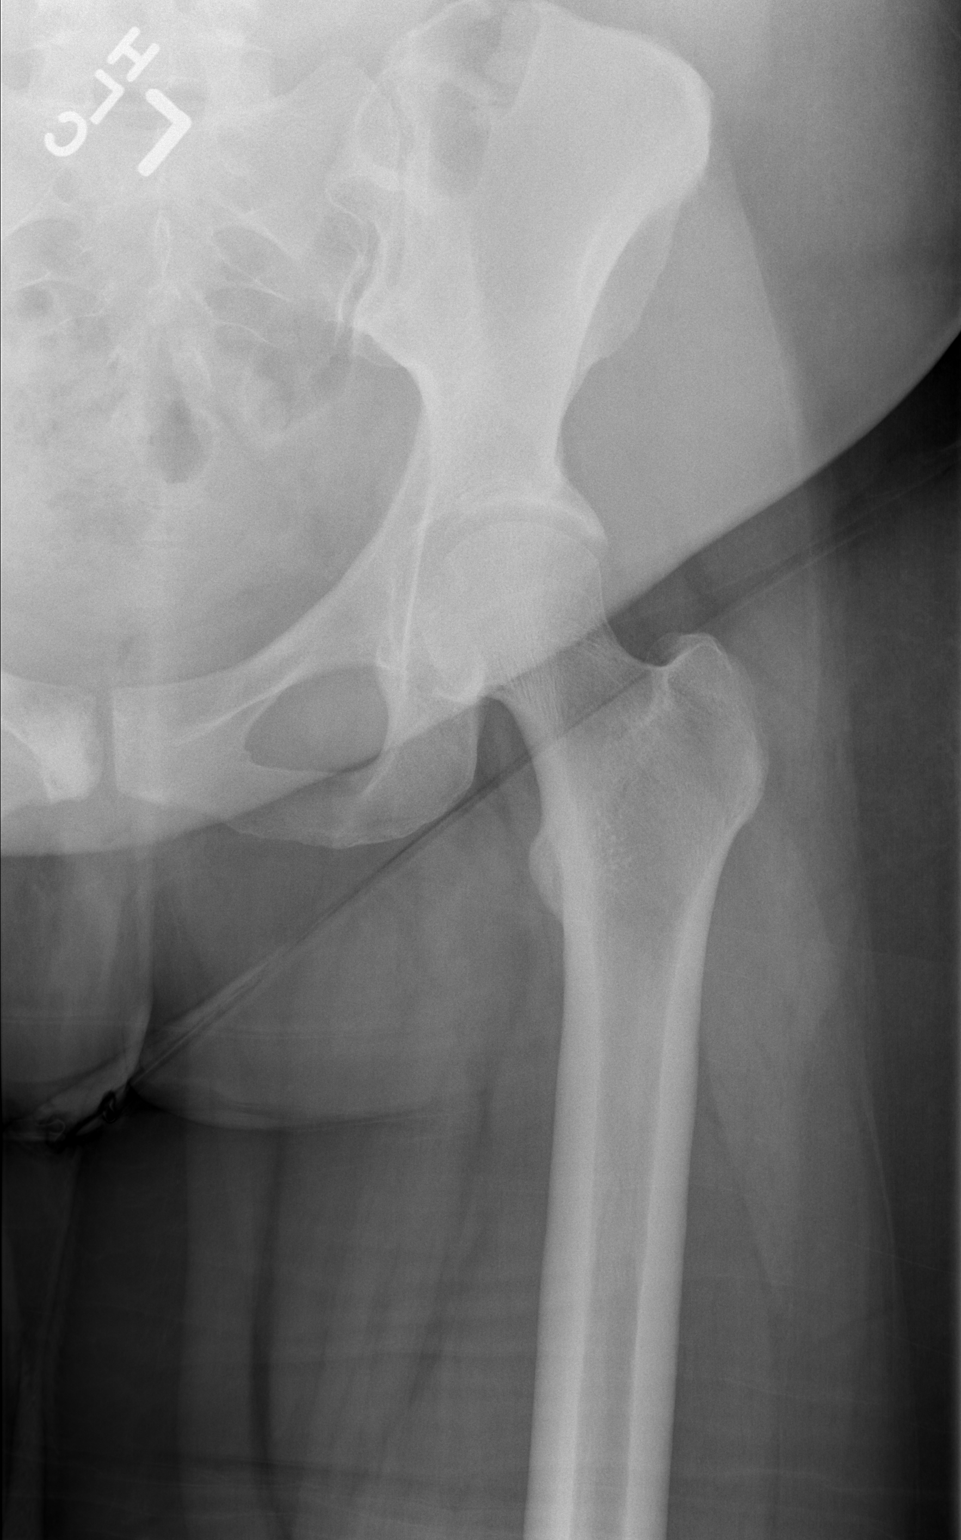

[t hip frog leg left]
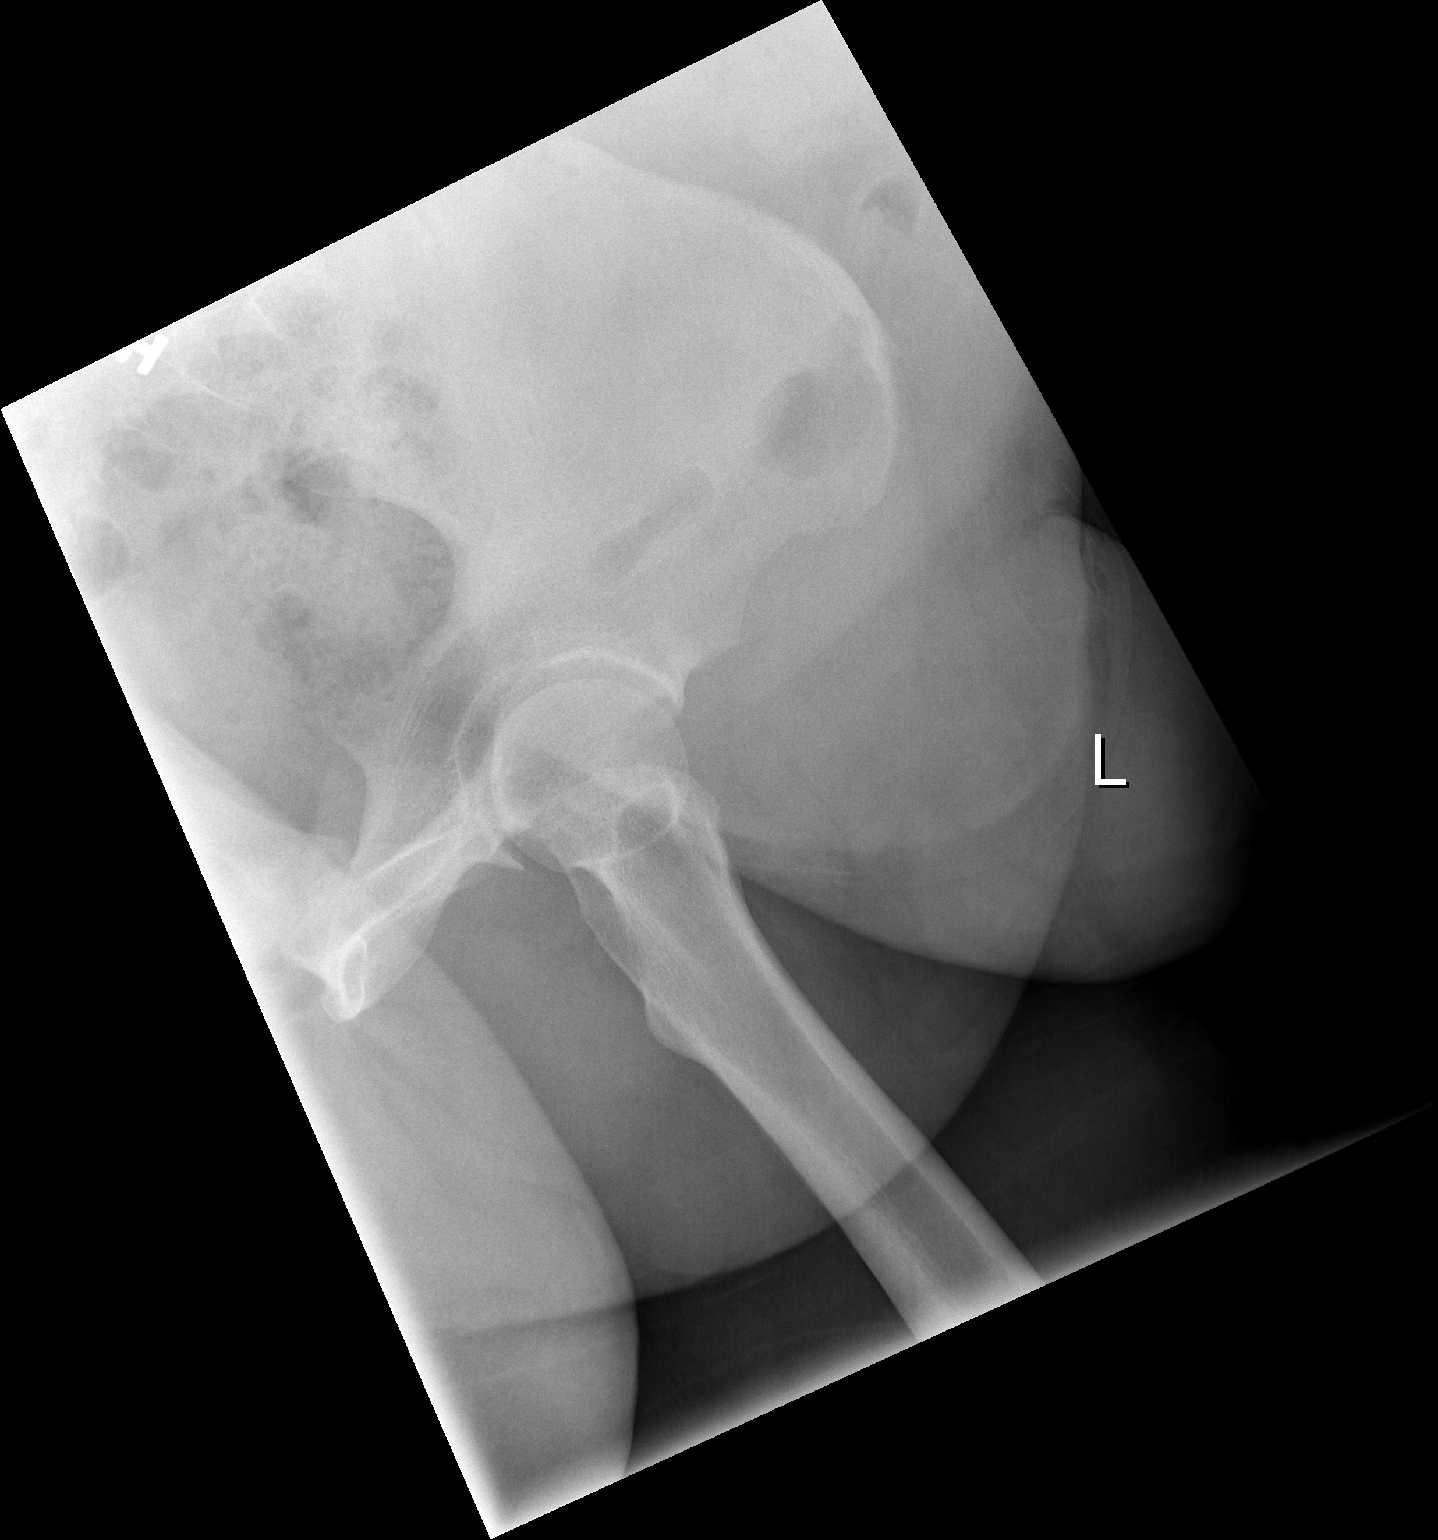

[3 of 3 positions shown; findings below may reference images not displayed]

FINDINGS: There is no evidence of hip fracture or dislocation. There is no
evidence of arthropathy or other focal bone abnormality.

Small density projects to the right of the L3-L4 disc space which
could reflect a ureteral stone but is more likely a phlebolith.
IMPRESSION: No fracture or dislocation.

## 2015-02-07 ENCOUNTER — Emergency Department (HOSPITAL_COMMUNITY)
Admission: EM | Admit: 2015-02-07 | Discharge: 2015-02-08 | Disposition: A | Discharge status: Home / Self Care | Payer: 59 | Attending: Emergency Medicine | Admitting: Emergency Medicine

## 2015-02-07 DIAGNOSIS — R05 Cough: Secondary | ICD-10-CM | POA: Diagnosis present

## 2015-02-07 DIAGNOSIS — R220 Localized swelling, mass and lump, head: Secondary | ICD-10-CM | POA: Diagnosis not present

## 2015-02-07 DIAGNOSIS — Z8632 Personal history of gestational diabetes: Secondary | ICD-10-CM | POA: Insufficient documentation

## 2015-02-07 DIAGNOSIS — Z8739 Personal history of other diseases of the musculoskeletal system and connective tissue: Secondary | ICD-10-CM | POA: Insufficient documentation

## 2015-02-07 DIAGNOSIS — R0789 Other chest pain: Secondary | ICD-10-CM | POA: Diagnosis not present

## 2015-02-07 DIAGNOSIS — J45901 Unspecified asthma with (acute) exacerbation: Secondary | ICD-10-CM | POA: Insufficient documentation

## 2015-02-07 DIAGNOSIS — Z8669 Personal history of other diseases of the nervous system and sense organs: Secondary | ICD-10-CM | POA: Insufficient documentation

## 2015-02-07 DIAGNOSIS — J069 Acute upper respiratory infection, unspecified: Secondary | ICD-10-CM | POA: Insufficient documentation

## 2015-02-07 DIAGNOSIS — H9211 Otorrhea, right ear: Secondary | ICD-10-CM | POA: Diagnosis not present

## 2015-02-07 DIAGNOSIS — I10 Essential (primary) hypertension: Secondary | ICD-10-CM | POA: Insufficient documentation

## 2015-02-07 DIAGNOSIS — Z8619 Personal history of other infectious and parasitic diseases: Secondary | ICD-10-CM | POA: Diagnosis not present

## 2015-02-08 ENCOUNTER — Encounter (HOSPITAL_COMMUNITY): Payer: Self-pay | Admitting: Nurse Practitioner

## 2015-02-08 MED ORDER — AZITHROMYCIN 250 MG PO TABS: ORAL_TABLET | ORAL | Status: DC

## 2015-02-08 NOTE — ED Notes (Signed)
Pt is c/o of facial pressure, cough and runny nose. Also states she may be having a sinus infection. Denies any other symptoms.

## 2015-02-08 NOTE — ED Provider Notes (Signed)
CSN: [id number]     Arrival date & time 02/07/15  2342 History  By signing my name below, I, [name], attest that this documentation has been prepared under the direction and in the presence of Lacretia Leigh, MD. Electronically Signed: [name], ED Scribe. 02/08/2015. 12:48 AM   Chief Complaint  Patient presents with  . Cough  . Facial Pain  . Cold Symptoms    The history is provided by the patient. No language interpreter was used.   Rita Lamb is a 39 y.o. female with PMHx of asthma who presents to the Emergency Department complaining sinus pain with onset yesterday. The pain is located near the forehead and at the right side of the face. She describes the pain as pressure. "Severe Sinus and Congestion" has provided insufficient pain relief at home. Associated symptoms include right-sided facial swelling, cough, chest tightness, and an episode of SOB yesterday. She has had similar symptoms in the past with sinus infections at a weather change. Pt states that her sinus infections always "go to [her] lungs".  No fever, ear pain, vomiting, or diarrhea. She does not use tobacco but notes being around second-hand smoke.   Past Medical History  Diagnosis Date  . Bell's palsy   . Helicobacter pylori (H. pylori) infection   . Arthritis     bilateral knee  . Asthma   . Gestational diabetes   . Hypertension    Past Surgical History  Procedure Laterality Date  . Tonsillectomy and adenoidectomy    . Cryotherapy  1996    cervix   Family History  Problem Relation Age of Onset  . Diabetes Father   . Diabetes Sister   . Alcohol abuse Sister   . Colon cancer Paternal Grandfather   . Cancer Paternal Grandfather   . Stroke Paternal Grandfather   . Alcohol abuse Mother   . HIV Mother   . Diabetes Mother   . Dementia Maternal Grandmother   . Cancer Maternal Grandmother 83    colon,   . Cancer Maternal Grandfather   . Diabetes Maternal Grandfather   . Cancer Paternal  Grandmother   . Heart disease Paternal Grandmother   . Diabetes Paternal Grandmother    Social History  Substance Use Topics  . Smoking status: Never Smoker   . Smokeless tobacco: Never Used  . Alcohol Use: No   OB History    No data available     Review of Systems  Constitutional: Negative for fever.  HENT: Positive for facial swelling and sinus pressure. Ear discharge: Right.        Right sided facial swelling  Respiratory: Positive for cough, chest tightness and shortness of breath.   All other systems reviewed and are negative.  Allergies  Sulfa antibiotics  Home Medications   Prior to Admission medications   Medication Sig Start Date End Date Taking? Authorizing Provider  albuterol (PROVENTIL HFA;VENTOLIN HFA) 108 (90 BASE) MCG/ACT inhaler Inhale 2 puffs into the lungs every 6 (six) hours as needed for wheezing or shortness of breath.    Yes Historical Provider, MD  Chlorphen-Phenyleph-APAP (SINUS CONGESTION/PAIN DAY/NGHT) 2-5-325 & 5-325 MG MISC Take 2 tablets by mouth every 6 (six) hours as needed (for congestion).   Yes Historical Provider, MD  ibuprofen (ADVIL,MOTRIN) 200 MG tablet Take 200 mg by mouth every 6 (six) hours as needed for moderate pain.    Yes Historical Provider, MD  amLODipine (NORVASC) 10 MG tablet TAKE 1 TABLET BY MOUTH DAILY Patient  not taking: Reported on 02/08/2015 05/19/14   Leone Haven, MD  cyclobenzaprine (FLEXERIL) 5 MG tablet Take 1 tablet (5 mg total) by mouth 3 (three) times daily as needed for muscle spasms. Patient not taking: Reported on 02/08/2015 09/03/14   Margarita Mail, PA-C  hydrochlorothiazide (HYDRODIURIL) 25 MG tablet TAKE 1 TABLET BY MOUTH EVERY DAY Patient not taking: Reported on 02/08/2015 05/21/14   Leone Haven, MD  HYDROcodone-acetaminophen (NORCO) 5-325 MG per tablet Take 1 tablet by mouth every 4 (four) hours as needed for severe pain. Patient not taking: Reported on 02/08/2015 09/03/14   Margarita Mail, PA-C   lidocaine (LIDODERM) 5 % Place 1 patch onto the skin daily. Remove & Discard patch within 12 hours or as directed by MD Patient not taking: Reported on 02/08/2015 09/03/14   Margarita Mail, PA-C  naproxen (NAPROSYN) 500 MG tablet Take 1 tablet (500 mg total) by mouth 2 (two) times daily. Patient not taking: Reported on 02/08/2015 09/03/14   Margarita Mail, PA-C   BP 170/87 mmHg  Pulse 68  Temp(Src) 98.6 F (37 C) (Oral)  Resp 19  SpO2 97%  LMP 01/11/2015 Physical Exam  Constitutional: She is oriented to person, place, and time. She appears well-developed and well-nourished.  Non-toxic appearance. No distress.  HENT:  Head: Normocephalic and atraumatic.  Mild TTP over the right maxillary sinus with no orbital involvement  Eyes: Conjunctivae, EOM and lids are normal. Pupils are equal, round, and reactive to light.  Neck: Normal range of motion. Neck supple. No tracheal deviation present. No thyroid mass present.  Cardiovascular: Normal rate, regular rhythm and normal heart sounds.  Exam reveals no gallop.   No murmur heard. Pulmonary/Chest: Effort normal and breath sounds normal. No stridor. No respiratory distress. She has no decreased breath sounds. She has no wheezes. She has no rhonchi. She has no rales.  Abdominal: Soft. Normal appearance and bowel sounds are normal. She exhibits no distension. There is no tenderness. There is no rebound and no CVA tenderness.  Musculoskeletal: Normal range of motion. She exhibits no edema or tenderness.  Neurological: She is alert and oriented to person, place, and time. She has normal strength. No cranial nerve deficit or sensory deficit. GCS eye subscore is 4. GCS verbal subscore is 5. GCS motor subscore is 6.  Skin: Skin is warm and dry. No abrasion and no rash noted.  Psychiatric: She has a normal mood and affect. Her speech is normal and behavior is normal.  Nursing note and vitals reviewed.   ED Course  Procedures (including critical care  time)  DIAGNOSTIC STUDIES: Oxygen Saturation is 97% on RA,  normal by my interpretation.    COORDINATION OF CARE: 12:41 AM Discussed treatment plan with pt at bedside and pt agreed to plan.  Labs Review Labs Reviewed - No data to display  Imaging Review No results found. I have personally reviewed and evaluated these images and lab results as part of my medical decision-making.   EKG Interpretation None      MDM   Final diagnoses:  None   I personally performed the services described in this documentation, which was scribed in my presence. The recorded information has been reviewed and is accurate.  Instructed to only use her Zithromax if her symptoms become worse   Lacretia Leigh, MD 02/08/15 [phone]

## 2015-02-08 NOTE — Discharge Instructions (Signed)
Only start the antibiotics if you get worse  Upper Respiratory Infection, Adult An upper respiratory infection (URI) is also sometimes known as the common cold. The upper respiratory tract includes the nose, sinuses, throat, trachea, and bronchi. Bronchi are the airways leading to the lungs. Most people improve within 1 week, but symptoms can last up to 2 weeks. A residual cough may last even longer.  CAUSES Many different viruses can infect the tissues lining the upper respiratory tract. The tissues become irritated and inflamed and often become very moist. Mucus production is also common. A cold is contagious. You can easily spread the virus to others by oral contact. This includes kissing, sharing a glass, coughing, or sneezing. Touching your mouth or nose and then touching a surface, which is then touched by another person, can also spread the virus. SYMPTOMS  Symptoms typically develop 1 to 3 days after you come in contact with a cold virus. Symptoms vary from person to person. They may include:  Runny nose.  Sneezing.  Nasal congestion.  Sinus irritation.  Sore throat.  Loss of voice (laryngitis).  Cough.  Fatigue.  Muscle aches.  Loss of appetite.  Headache.  Low-grade fever. DIAGNOSIS  You might diagnose your own cold based on familiar symptoms, since most people get a cold 2 to 3 times a year. Your caregiver can confirm this based on your exam. Most importantly, your caregiver can check that your symptoms are not due to another disease such as strep throat, sinusitis, pneumonia, asthma, or epiglottitis. Blood tests, throat tests, and X-rays are not necessary to diagnose a common cold, but they may sometimes be helpful in excluding other more serious diseases. Your caregiver will decide if any further tests are required. RISKS AND COMPLICATIONS  You may be at risk for a more severe case of the common cold if you smoke cigarettes, have chronic heart disease (such as heart  failure) or lung disease (such as asthma), or if you have a weakened immune system. The very young and very old are also at risk for more serious infections. Bacterial sinusitis, middle ear infections, and bacterial pneumonia can complicate the common cold. The common cold can worsen asthma and chronic obstructive pulmonary disease (COPD). Sometimes, these complications can require emergency medical care and may be life-threatening. PREVENTION  The best way to protect against getting a cold is to practice good hygiene. Avoid oral or hand contact with people with cold symptoms. Wash your hands often if contact occurs. There is no clear evidence that vitamin C, vitamin E, echinacea, or exercise reduces the chance of developing a cold. However, it is always recommended to get plenty of rest and practice good nutrition. TREATMENT  Treatment is directed at relieving symptoms. There is no cure. Antibiotics are not effective, because the infection is caused by a virus, not by bacteria. Treatment may include:  Increased fluid intake. Sports drinks offer valuable electrolytes, sugars, and fluids.  Breathing heated mist or steam (vaporizer or shower).  Eating chicken soup or other clear broths, and maintaining good nutrition.  Getting plenty of rest.  Using gargles or lozenges for comfort.  Controlling fevers with ibuprofen or acetaminophen as directed by your caregiver.  Increasing usage of your inhaler if you have asthma. Zinc gel and zinc lozenges, taken in the first 24 hours of the common cold, can shorten the duration and lessen the severity of symptoms. Pain medicines may help with fever, muscle aches, and throat pain. A variety of non-prescription medicines are  available to treat congestion and runny nose. Your caregiver can make recommendations and may suggest nasal or lung inhalers for other symptoms.  HOME CARE INSTRUCTIONS   Only take over-the-counter or prescription medicines for pain,  discomfort, or fever as directed by your caregiver.  Use a warm mist humidifier or inhale steam from a shower to increase air moisture. This may keep secretions moist and make it easier to breathe.  Drink enough water and fluids to keep your urine clear or pale yellow.  Rest as needed.  Return to work when your temperature has returned to normal or as your caregiver advises. You may need to stay home longer to avoid infecting others. You can also use a face mask and careful hand washing to prevent spread of the virus. SEEK MEDICAL CARE IF:   After the first few days, you feel you are getting worse rather than better.  You need your caregiver's advice about medicines to control symptoms.  You develop chills, worsening shortness of breath, or brown or red sputum. These may be signs of pneumonia.  You develop yellow or brown nasal discharge or pain in the face, especially when you bend forward. These may be signs of sinusitis.  You develop a fever, swollen neck glands, pain with swallowing, or white areas in the back of your throat. These may be signs of strep throat. SEEK IMMEDIATE MEDICAL CARE IF:   You have a fever.  You develop severe or persistent headache, ear pain, sinus pain, or chest pain.  You develop wheezing, a prolonged cough, cough up blood, or have a change in your usual mucus (if you have chronic lung disease).  You develop sore muscles or a stiff neck. Document Released: 10/19/2000 Document Revised: 07/18/2011 Document Reviewed: 07/31/2013 [facility] Patient Information 2015 [place], [place]. This information is not intended to replace advice given to you by your health care provider. Make sure you discuss any questions you have with your health care provider.

## 2015-02-09 ENCOUNTER — Encounter (HOSPITAL_COMMUNITY): Payer: Self-pay | Admitting: Emergency Medicine

## 2015-02-09 ENCOUNTER — Emergency Department (HOSPITAL_COMMUNITY)
Admission: EM | Admit: 2015-02-09 | Discharge: 2015-02-10 | Disposition: A | Discharge status: Home / Self Care | Payer: 59 | Attending: Emergency Medicine | Admitting: Emergency Medicine

## 2015-02-09 DIAGNOSIS — I10 Essential (primary) hypertension: Secondary | ICD-10-CM | POA: Insufficient documentation

## 2015-02-09 DIAGNOSIS — M170 Bilateral primary osteoarthritis of knee: Secondary | ICD-10-CM | POA: Insufficient documentation

## 2015-02-09 DIAGNOSIS — Z79899 Other long term (current) drug therapy: Secondary | ICD-10-CM | POA: Insufficient documentation

## 2015-02-09 DIAGNOSIS — Z8619 Personal history of other infectious and parasitic diseases: Secondary | ICD-10-CM | POA: Insufficient documentation

## 2015-02-09 DIAGNOSIS — J45901 Unspecified asthma with (acute) exacerbation: Secondary | ICD-10-CM | POA: Insufficient documentation

## 2015-02-09 DIAGNOSIS — J069 Acute upper respiratory infection, unspecified: Secondary | ICD-10-CM | POA: Insufficient documentation

## 2015-02-09 DIAGNOSIS — Z8632 Personal history of gestational diabetes: Secondary | ICD-10-CM | POA: Insufficient documentation

## 2015-02-09 DIAGNOSIS — Z8669 Personal history of other diseases of the nervous system and sense organs: Secondary | ICD-10-CM | POA: Insufficient documentation

## 2015-02-09 MED ORDER — GUAIFENESIN ER 600 MG PO TB12
1200.0000 mg | ORAL_TABLET | Freq: Two times a day (BID) | ORAL | Status: DC
  Administered 2015-02-10: 1200 mg via ORAL
  Filled 2015-02-09: qty 2

## 2015-02-09 MED ORDER — LORATADINE 10 MG PO TABS
10.0000 mg | ORAL_TABLET | Freq: Once | ORAL | Status: AC
  Administered 2015-02-10: 10 mg via ORAL
  Filled 2015-02-09: qty 1

## 2015-02-09 MED ORDER — IBUPROFEN 800 MG PO TABS
800.0000 mg | ORAL_TABLET | Freq: Once | ORAL | Status: AC
  Administered 2015-02-10: 800 mg via ORAL
  Filled 2015-02-09: qty 1

## 2015-02-09 MED ORDER — BENZONATATE 100 MG PO CAPS
200.0000 mg | ORAL_CAPSULE | Freq: Once | ORAL | Status: AC
  Administered 2015-02-10: 200 mg via ORAL
  Filled 2015-02-09: qty 2

## 2015-02-09 NOTE — ED Provider Notes (Signed)
CSN: [id number]     Arrival date & time 02/09/15  2306 History  By signing my name below, I, Eustaquio Maize, attest that this documentation has been prepared under the direction and in the presence of Sukaina Toothaker, MD. Electronically Signed: Eustaquio Maize, ED Scribe. 02/09/2015. 11:55 PM.   Chief Complaint  Patient presents with  . URI   Patient is a 39 y.o. female presenting with URI. The history is provided by the patient. No language interpreter was used.  URI Presenting symptoms: congestion and cough   Presenting symptoms: no ear pain, no fever and no sore throat   Severity:  Moderate Onset quality:  Gradual Duration:  4 days Timing:  Constant Progression:  Worsening Chronicity:  New Relieved by:  Nothing Worsened by:  Nothing tried Ineffective treatments:  Inhaler and prescription medications Associated symptoms: wheezing   Risk factors: no immunosuppression and no recent travel      HPI Comments: Rita Lamb is a 39 y.o. female who presents to the Emergency Department complaining of gradual onset, wheezing, shortness of breath, chest tightness, nasal congestion, and cough x 4 days. Pt was seen in the ED 2 days ago for sinus pressure, right sided facial swelling, cough, chest tightness, and shortness of breath. She was diagnosed with URI and prescribed Z pack to be taken in two days if her symptoms worsened. Pt began taking the Z pack today due to worsening symptoms. She has also been using her inhaler today without relief. Pt notes that her sinus pressure has resolved since onset. Denies fever, chills, or any other associated symptoms.   Past Medical History  Diagnosis Date  . Bell's palsy   . Helicobacter pylori (H. pylori) infection   . Arthritis     bilateral knee  . Asthma   . Gestational diabetes   . Hypertension    Past Surgical History  Procedure Laterality Date  . Tonsillectomy and adenoidectomy    . Cryotherapy  1996    cervix   Family History   Problem Relation Age of Onset  . Diabetes Father   . Diabetes Sister   . Alcohol abuse Sister   . Colon cancer Paternal Grandfather   . Cancer Paternal Grandfather   . Stroke Paternal Grandfather   . Alcohol abuse Mother   . HIV Mother   . Diabetes Mother   . Dementia Maternal Grandmother   . Cancer Maternal Grandmother 63    colon,   . Cancer Maternal Grandfather   . Diabetes Maternal Grandfather   . Cancer Paternal Grandmother   . Heart disease Paternal Grandmother   . Diabetes Paternal Grandmother    Social History  Substance Use Topics  . Smoking status: Passive Smoke Exposure - Never Smoker  . Smokeless tobacco: Never Used  . Alcohol Use: No   OB History    No data available     Review of Systems  Constitutional: Negative for fever and chills.  HENT: Positive for congestion and sinus pressure. Negative for ear pain and sore throat.   Respiratory: Positive for cough, shortness of breath and wheezing.   All other systems reviewed and are negative.     Allergies  Sulfa antibiotics  Home Medications   Prior to Admission medications   Medication Sig Start Date End Date Taking? Authorizing Provider  albuterol (PROVENTIL HFA;VENTOLIN HFA) 108 (90 BASE) MCG/ACT inhaler Inhale 2 puffs into the lungs every 6 (six) hours as needed for wheezing or shortness of breath.  Historical Provider, MD  amLODipine (NORVASC) 10 MG tablet TAKE 1 TABLET BY MOUTH DAILY Patient not taking: Reported on 02/08/2015 05/19/14   Leone Haven, MD  azithromycin (ZITHROMAX Z-PAK) 250 MG tablet 2 by mouth daily x1 then 1 by mouth daily x4 days 02/08/15   Lacretia Leigh, MD  Chlorphen-Phenyleph-APAP (SINUS CONGESTION/PAIN DAY/NGHT) 2-5-325 & 5-325 MG MISC Take 2 tablets by mouth every 6 (six) hours as needed (for congestion).    Historical Provider, MD  cyclobenzaprine (FLEXERIL) 5 MG tablet Take 1 tablet (5 mg total) by mouth 3 (three) times daily as needed for muscle spasms. Patient not  taking: Reported on 02/08/2015 09/03/14   Margarita Mail, PA-C  hydrochlorothiazide (HYDRODIURIL) 25 MG tablet TAKE 1 TABLET BY MOUTH EVERY DAY Patient not taking: Reported on 02/08/2015 05/21/14   Leone Haven, MD  HYDROcodone-acetaminophen (NORCO) 5-325 MG per tablet Take 1 tablet by mouth every 4 (four) hours as needed for severe pain. Patient not taking: Reported on 02/08/2015 09/03/14   Margarita Mail, PA-C  ibuprofen (ADVIL,MOTRIN) 200 MG tablet Take 200 mg by mouth every 6 (six) hours as needed for moderate pain.     Historical Provider, MD  lidocaine (LIDODERM) 5 % Place 1 patch onto the skin daily. Remove & Discard patch within 12 hours or as directed by MD Patient not taking: Reported on 02/08/2015 09/03/14   Margarita Mail, PA-C  naproxen (NAPROSYN) 500 MG tablet Take 1 tablet (500 mg total) by mouth 2 (two) times daily. Patient not taking: Reported on 02/08/2015 09/03/14   Margarita Mail, PA-C   Triage VItals: BP 156/82 mmHg  Pulse 77  Temp(Src) 98.5 F (36.9 C) (Oral)  Resp 16  SpO2 95%  LMP 01/11/2015   Physical Exam  Constitutional: She is oriented to person, place, and time. She appears well-developed and well-nourished. No distress.  HENT:  Head: Normocephalic and atraumatic.  Right Ear: Tympanic membrane normal.  Left Ear: Tympanic membrane normal.  Mouth/Throat: Oropharynx is clear and moist.  No fluid in right and left maxillary sinus  Eyes: Conjunctivae and EOM are normal. Pupils are equal, round, and reactive to light.  Neck: Normal range of motion. Neck supple. No tracheal deviation present.  Referred upper airway noises  Cardiovascular: Normal rate, regular rhythm and intact distal pulses.   Pulmonary/Chest: Effort normal and breath sounds normal. No stridor. No respiratory distress. She has no wheezes. She has no rhonchi. She has no rales.  Abdominal: Soft. Bowel sounds are normal. There is no tenderness. There is no rebound and no guarding.  Musculoskeletal:  Normal range of motion.  Neurological: She is alert and oriented to person, place, and time.  Skin: Skin is warm and dry.  Psychiatric: She has a normal mood and affect. Her behavior is normal.  Nursing note and vitals reviewed.   ED Course  Procedures (including critical care time)  DIAGNOSTIC STUDIES: Oxygen Saturation is 95% on RA, adequate by my interpretation.    COORDINATION OF CARE: 11:51 PM-Discussed treatment plan with pt at bedside and pt agreed to plan.   Labs Review Labs Reviewed - No data to display  Imaging Review No results found.    EKG Interpretation None      MDM   Final diagnoses:  None   Zpak not working because symptoms are very clearly viral in nature.  Will treat with flonase, mucinex and tessalon.  Follow up with your PMD for ongoing care.    I, Xavior Niazi-RASCH,Dominic Rhome K, personally performed the services  described in this documentation. All medical record entries made by the scribe were at my direction and in my presence.  I have reviewed the chart and discharge instructions and agree that the record reflects my personal performance and is accurate and complete. Terren Jandreau-RASCH,Sharica Roedel K.  02/10/2015. 2:23 AM.       Ghada Abbett, MD 02/10/15 [id number]

## 2015-02-09 NOTE — ED Notes (Signed)
Pt states she was seen here Saturday night by Dr Zenia Resides and was told she had an URI  He gave her a script for a Zpack and told her if she didn't feel any better in the next couple days start it  Pt states she started it this afternoon  Pt states she feels hot on the inside but does not have a fever, dry cough, and feels short of breath  Pt states she used her inhaler today without relief  Pt states she has been wheezing since Friday

## 2015-02-10 ENCOUNTER — Encounter (HOSPITAL_COMMUNITY): Payer: Self-pay | Admitting: Emergency Medicine

## 2015-02-10 ENCOUNTER — Emergency Department (HOSPITAL_COMMUNITY): Payer: 59

## 2015-02-10 MED ORDER — FLUTICASONE PROPIONATE 50 MCG/ACT NA SUSP: 2.0000 | Freq: Every day | NASAL | Status: DC

## 2015-02-10 MED ORDER — GUAIFENESIN ER 600 MG PO TB12: 600.0000 mg | ORAL_TABLET | Freq: Two times a day (BID) | ORAL | Status: DC

## 2015-02-10 MED ORDER — BENZONATATE 100 MG PO CAPS: 100.0000 mg | ORAL_CAPSULE | Freq: Three times a day (TID) | ORAL | Status: DC

## 2015-02-10 NOTE — Discharge Instructions (Signed)
Cool Mist Vaporizers °Vaporizers may help relieve the symptoms of a cough and cold. They add moisture to the air, which helps mucus to become thinner and less sticky. This makes it easier to breathe and cough up secretions. Cool mist vaporizers do not cause serious burns like hot mist vaporizers, which may also be called steamers or humidifiers. Vaporizers have not been proven to help with colds. You should not use a vaporizer if you are allergic to mold. °HOME CARE INSTRUCTIONS °· Follow the package instructions for the vaporizer. °· Do not use anything other than distilled water in the vaporizer. °· Do not run the vaporizer all of the time. This can cause mold or bacteria to grow in the vaporizer. °· Clean the vaporizer after each time it is used. °· Clean and dry the vaporizer well before storing it. °· Stop using the vaporizer if worsening respiratory symptoms develop. °Document Released: 01/21/2004 Document Revised: 04/30/2013 Document Reviewed: 09/12/2012 °[employer]® Patient Information ©2015 [employer]. This information is not intended to replace advice given to you by your health care provider. Make sure you discuss any questions you have with your health care provider. ° °

## 2015-05-10 NOTE — L&D Delivery Note (Addendum)
Delivery Note Pt pushed w/ RN x 20 minutes w/ minimal progress. FHR w/ min-mod variability, frequent variables, few lates, pos scalp stim. Dr. Rip Harbour reviewed strip. Pt still very numb after epidural dosing. Labored down x 1 hour. Pushed x 25 minutes w/ excellent progress. At 6:58 PM a viable and healthy female was delivered via Vaginal, Spontaneous Delivery (Presentation: LOA).  No difficulty w/ shoulders. Large amount of amniotic fluid noted at delivery. APGAR: 8, 9; weight 7 lb 6.7 oz (3365 g).   Placenta status: Spontaneous, intact  Cord: 3VC with the following complications: None.  Cord pH: NA  Anesthesia:   Episiotomy: None Lacerations: None Suture Repair: NA Est. Blood Loss (mL): 300  Mom to postpartum.  Baby to Couplet care / Skin to Skin. Mag x 24 hours HCTZ Fasting CBG Placenta to: BS Feeding: Breast Circ: NA Contraception: Interval BTL  Manya Silvas 12/21/2015, 8:51 PM

## 2015-06-03 ENCOUNTER — Ambulatory Visit (INDEPENDENT_AMBULATORY_CARE_PROVIDER_SITE_OTHER): Payer: BLUE CROSS/BLUE SHIELD | Admitting: Family Medicine

## 2015-06-03 ENCOUNTER — Encounter: Payer: Self-pay | Admitting: Family Medicine

## 2015-06-03 VITALS — BP 182/95 | HR 67 | Temp 98.3°F | Wt 250.8 lb

## 2015-06-03 DIAGNOSIS — H6123 Impacted cerumen, bilateral: Secondary | ICD-10-CM | POA: Diagnosis not present

## 2015-06-03 DIAGNOSIS — I10 Essential (primary) hypertension: Secondary | ICD-10-CM

## 2015-06-03 DIAGNOSIS — Z3200 Encounter for pregnancy test, result unknown: Secondary | ICD-10-CM | POA: Diagnosis not present

## 2015-06-03 DIAGNOSIS — Z3480 Encounter for supervision of other normal pregnancy, unspecified trimester: Secondary | ICD-10-CM | POA: Insufficient documentation

## 2015-06-03 DIAGNOSIS — Z3481 Encounter for supervision of other normal pregnancy, first trimester: Secondary | ICD-10-CM

## 2015-06-03 LAB — CBC WITH DIFFERENTIAL/PLATELET
BASOS ABS: 0 10*3/uL (ref 0.0–0.1)
BASOS PCT: 0 % (ref 0–1)
EOS ABS: 0.1 10*3/uL (ref 0.0–0.7)
EOS PCT: 1 % (ref 0–5)
HEMATOCRIT: 37.5 % (ref 36.0–46.0)
Hemoglobin: 12.3 g/dL (ref 12.0–15.0)
Lymphocytes Relative: 25 % (ref 12–46)
Lymphs Abs: 2.8 10*3/uL (ref 0.7–4.0)
MCH: 28 pg (ref 26.0–34.0)
MCHC: 32.8 g/dL (ref 30.0–36.0)
MCV: 85.4 fL (ref 78.0–100.0)
MONO ABS: 0.7 10*3/uL (ref 0.1–1.0)
MPV: 10 fL (ref 8.6–12.4)
Monocytes Relative: 6 % (ref 3–12)
Neutro Abs: 7.5 10*3/uL (ref 1.7–7.7)
Neutrophils Relative %: 68 % (ref 43–77)
PLATELETS: 402 10*3/uL — AB (ref 150–400)
RBC: 4.39 MIL/uL (ref 3.87–5.11)
RDW: 14.8 % (ref 11.5–15.5)
WBC: 11.1 10*3/uL — AB (ref 4.0–10.5)

## 2015-06-03 LAB — BASIC METABOLIC PANEL
BUN: 7 mg/dL (ref 7–25)
CALCIUM: 8.9 mg/dL (ref 8.6–10.2)
CO2: 26 mmol/L (ref 20–31)
CREATININE: 0.51 mg/dL (ref 0.50–1.10)
Chloride: 102 mmol/L (ref 98–110)
GLUCOSE: 87 mg/dL (ref 65–99)
Potassium: 4.2 mmol/L (ref 3.5–5.3)
Sodium: 135 mmol/L (ref 135–146)

## 2015-06-03 LAB — POCT URINE PREGNANCY: Preg Test, Ur: POSITIVE — AB

## 2015-06-03 MED ORDER — HYDROCHLOROTHIAZIDE 25 MG PO TABS: 25.0000 mg | ORAL_TABLET | Freq: Every day | ORAL | Status: DC

## 2015-06-03 MED ORDER — LABETALOL HCL 200 MG PO TABS: 200.0000 mg | ORAL_TABLET | Freq: Two times a day (BID) | ORAL | Status: DC

## 2015-06-03 NOTE — Patient Instructions (Signed)
Thanks for coming in today.   You are pregnant. Congratulations!   One of the most important things we can do right now is control your blood pressure.   I have sent two blood pressure medications to your pharmacy. Take the HCTZ once daily, and the Labetalol twice daily as prescribed.   Return for follow up of your blood pressure in one week for a nurse's visit and one month.   Come back in one month to get pap smear and full breast exam.   We will schedule you with an OB provider if you would like [place] to follow you for your pregnancy.   Thanks for letting us take care of you.   Sincerely,  Paula Compton, MD  First Trimester of Pregnancy The first trimester of pregnancy is from week 1 until the end of week 12 (months 1 through 3). A week after a sperm fertilizes an egg, the egg will implant on the wall of the uterus. This embryo will begin to develop into a baby. Genes from you and your partner are forming the baby. The female genes determine whether the baby is a boy or a girl. At 6-8 weeks, the eyes and face are formed, and the heartbeat can be seen on ultrasound. At the end of 12 weeks, all the baby's organs are formed.  Now that you are pregnant, you will want to do everything you can to have a healthy baby. Two of the most important things are to get good prenatal care and to follow your health care provider's instructions. Prenatal care is all the medical care you receive before the baby's birth. This care will help prevent, find, and treat any problems during the pregnancy and childbirth. BODY CHANGES Your body goes through many changes during pregnancy. The changes vary from woman to woman.   You may gain or lose a couple of pounds at first.  You may feel sick to your stomach (nauseous) and throw up (vomit). If the vomiting is uncontrollable, call your health care provider.  You may tire easily.  You may develop headaches that can be relieved by medicines approved by your health  care provider.  You may urinate more often. Painful urination may mean you have a bladder infection.  You may develop heartburn as a result of your pregnancy.  You may develop constipation because certain hormones are causing the muscles that push waste through your intestines to slow down.  You may develop hemorrhoids or swollen, bulging veins (varicose veins).  Your breasts may begin to grow larger and become tender. Your nipples may stick out more, and the tissue that surrounds them (areola) may become darker.  Your gums may bleed and may be sensitive to brushing and flossing.  Dark spots or blotches (chloasma, mask of pregnancy) may develop on your face. This will likely fade after the baby is born.  Your menstrual periods will stop.  You may have a loss of appetite.  You may develop cravings for certain kinds of food.  You may have changes in your emotions from day to day, such as being excited to be pregnant or being concerned that something may go wrong with the pregnancy and baby.  You may have more vivid and strange dreams.  You may have changes in your hair. These can include thickening of your hair, rapid growth, and changes in texture. Some women also have hair loss during or after pregnancy, or hair that feels dry or thin. Your hair will most  likely return to normal after your baby is born. WHAT TO EXPECT AT YOUR PRENATAL VISITS During a routine prenatal visit:  You will be weighed to make sure you and the baby are growing normally.  Your blood pressure will be taken.  Your abdomen will be measured to track your baby's growth.  The fetal heartbeat will be listened to starting around week 10 or 12 of your pregnancy.  Test results from any previous visits will be discussed. Your health care provider may ask you:  How you are feeling.  If you are feeling the baby move.  If you have had any abnormal symptoms, such as leaking fluid, bleeding, severe headaches, or  abdominal cramping.  If you are using any tobacco products, including cigarettes, chewing tobacco, and electronic cigarettes.  If you have any questions. Other tests that may be performed during your first trimester include:  Blood tests to find your blood type and to check for the presence of any previous infections. They will also be used to check for low iron levels (anemia) and Rh antibodies. Later in the pregnancy, blood tests for diabetes will be done along with other tests if problems develop.  Urine tests to check for infections, diabetes, or protein in the urine.  An ultrasound to confirm the proper growth and development of the baby.  An amniocentesis to check for possible genetic problems.  Fetal screens for spina bifida and Down syndrome.  You may need other tests to make sure you and the baby are doing well.  HIV (human immunodeficiency virus) testing. Routine prenatal testing includes screening for HIV, unless you choose not to have this test. HOME CARE INSTRUCTIONS  Medicines  Follow your health care provider's instructions regarding medicine use. Specific medicines may be either safe or unsafe to take during pregnancy.  Take your prenatal vitamins as directed.  If you develop constipation, try taking a stool softener if your health care provider approves. Diet  Eat regular, well-balanced meals. Choose a variety of foods, such as meat or vegetable-based protein, fish, milk and low-fat dairy products, vegetables, fruits, and whole grain breads and cereals. Your health care provider will help you determine the amount of weight gain that is right for you.  Avoid raw meat and uncooked cheese. These carry germs that can cause birth defects in the baby.  Eating four or five small meals rather than three large meals a day may help relieve nausea and vomiting. If you start to feel nauseous, eating a few soda crackers can be helpful. Drinking liquids between meals instead of  during meals also seems to help nausea and vomiting.  If you develop constipation, eat more high-fiber foods, such as fresh vegetables or fruit and whole grains. Drink enough fluids to keep your urine clear or pale yellow. Activity and Exercise  Exercise only as directed by your health care provider. Exercising will help you:  Control your weight.  Stay in shape.  Be prepared for labor and delivery.  Experiencing pain or cramping in the lower abdomen or low back is a good sign that you should stop exercising. Check with your health care provider before continuing normal exercises.  Try to avoid standing for long periods of time. Move your legs often if you must stand in one place for a long time.  Avoid heavy lifting.  Wear low-heeled shoes, and practice good posture.  You may continue to have sex unless your health care provider directs you otherwise. Relief of Pain or Discomfort  Wear a good support bra for breast tenderness.   Take warm sitz baths to soothe any pain or discomfort caused by hemorrhoids. Use hemorrhoid cream if your health care provider approves.   Rest with your legs elevated if you have leg cramps or low back pain.  If you develop varicose veins in your legs, wear support hose. Elevate your feet for 15 minutes, 3-4 times a day. Limit salt in your diet. Prenatal Care  Schedule your prenatal visits by the twelfth week of pregnancy. They are usually scheduled monthly at first, then more often in the last 2 months before delivery.  Write down your questions. Take them to your prenatal visits.  Keep all your prenatal visits as directed by your health care provider. Safety  Wear your seat belt at all times when driving.  Make a list of emergency phone numbers, including numbers for family, friends, the hospital, and police and fire departments. General Tips  Ask your health care provider for a referral to a local prenatal education class. Begin classes no  later than at the beginning of month 6 of your pregnancy.  Ask for help if you have counseling or nutritional needs during pregnancy. Your health care provider can offer advice or refer you to specialists for help with various needs.  Do not use hot tubs, steam rooms, or saunas.  Do not douche or use tampons or scented sanitary pads.  Do not cross your legs for long periods of time.  Avoid cat litter boxes and soil used by cats. These carry germs that can cause birth defects in the baby and possibly loss of the fetus by miscarriage or stillbirth.  Avoid all smoking, herbs, alcohol, and medicines not prescribed by your health care provider. Chemicals in these affect the formation and growth of the baby.  Do not use any tobacco products, including cigarettes, chewing tobacco, and electronic cigarettes. If you need help quitting, ask your health care provider. You may receive counseling support and other resources to help you quit.  Schedule a dentist appointment. At home, brush your teeth with a soft toothbrush and be gentle when you floss. SEEK MEDICAL CARE IF:   You have dizziness.  You have mild pelvic cramps, pelvic pressure, or nagging pain in the abdominal area.  You have persistent nausea, vomiting, or diarrhea.  You have a bad smelling vaginal discharge.  You have pain with urination.  You notice increased swelling in your face, hands, legs, or ankles. SEEK IMMEDIATE MEDICAL CARE IF:   You have a fever.  You are leaking fluid from your vagina.  You have spotting or bleeding from your vagina.  You have severe abdominal cramping or pain.  You have rapid weight gain or loss.  You vomit blood or material that looks like coffee grounds.  You are exposed to [place] measles and have never had them.  You are exposed to fifth disease or chickenpox.  You develop a severe headache.  You have shortness of breath.  You have any kind of trauma, such as from a fall or a car  accident.   This information is not intended to replace advice given to you by your health care provider. Make sure you discuss any questions you have with your health care provider.   Document Released: 04/19/2001 Document Revised: 05/16/2014 Document Reviewed: 03/05/2013 Elsevier Interactive Patient Education [employer].

## 2015-06-03 NOTE — Assessment & Plan Note (Signed)
Cerumen impaction.  - Ears flushed today.

## 2015-06-03 NOTE — Assessment & Plan Note (Signed)
Very poorly controlled. Pt. Noncompliant. Mild headaches, but otherwise no end organ damage obvious at this time. She needs to get back on her medication. We discussed the importance of this, and the importance of diet.  - BMET, CBC today - HCTZ 25mg , Labetalol 200mg  BID for now given pregnancy - follow up in one week for nurse visit and HTN check - Will adjust medication as needed.

## 2015-06-03 NOTE — Progress Notes (Signed)
Patient ID: Rita Lamb, female   DOB: 1976/02/23, 40 y.o.   MRN: [id number]   [facility] Aquilla Hacker, MD Phone: [phone]  Subjective:   # Possible Pregnancy - pt. Here for evaluation of possible pregnancy.  - this in the setting of irregular periods for years, and hyperandrogenism ( she actually has to shave her face), in addition to truncal obesity. She meets 2/3 [place] criteria.  - Her last period was in 02/2014. She says she is not sure when she may have become pregnant, but took a home pregnancy test which was positive one week ago.  - She has not had bleeding or LOF, no abdominal pain.  - She does have uncontrolled HTN, Hx of GDA1, and otherwise two prior uncomplicated vaginal births.  - This was an unintended pregnancy which she desires to keep.   # HTN - Very poorly controlled. - Has not been taking her medication for nearly a year now.  - Says it was low at times before so she didn't think she needed her medication.  - she is out at this time.  - No chest pain, SOB, no LE edema.  - She has not had palpitations, vision changes, she gets intermittent headaches.  - She does not have home BP monitoring.   # Health Maintenance - needs pap smear - needs HIV screening which she will have with pregnancy - Needs lipid panel - Needs flu vaccine.   # Cerumen Impaction  - She says she gets her ears cleaned yearly by her PCP. She does not like to clean them herself because she is afraid of damaging her ear drums.   All relevant systems were reviewed and were negative unless otherwise noted in the HPI  Past Medical History Reviewed problem list.  Medications- reviewed and updated Current Outpatient Prescriptions  Medication Sig Dispense Refill  . acetaminophen (TYLENOL) 500 MG tablet Take 500 mg by mouth every 6 (six) hours as needed (for pain).    Marland Kitchen albuterol (PROVENTIL HFA;VENTOLIN HFA) 108 (90 BASE) MCG/ACT inhaler Inhale 2 puffs into the  lungs every 6 (six) hours as needed for wheezing or shortness of breath.     Marland Kitchen ibuprofen (ADVIL,MOTRIN) 200 MG tablet Take 200 mg by mouth every 6 (six) hours as needed for moderate pain.     Marland Kitchen amLODipine (NORVASC) 10 MG tablet TAKE 1 TABLET BY MOUTH DAILY (Patient not taking: Reported on 02/08/2015) 90 tablet 0  . hydrochlorothiazide (HYDRODIURIL) 25 MG tablet Take 1 tablet (25 mg total) by mouth daily. 90 tablet 3  . labetalol (NORMODYNE) 200 MG tablet Take 1 tablet (200 mg total) by mouth 2 (two) times daily. 180 tablet 0   No current facility-administered medications for this visit.   Chief complaint-noted No additions to family history Social history- patient is a non smoker  Objective: BP 182/95 mmHg  Pulse 67  Temp(Src) 98.3 F (36.8 C) (Oral)  Wt 250 lb 12.8 oz (113.762 kg)  LMP 02/16/2015 (Approximate) Gen: NAD, alert, cooperative with exam, Obese female HEENT: NCAT, EOMI, PERRL, TMs not visible and obstructed by large amount of cerumen. Virulization with facial hair clearly visible.  Neck: FROM, supple CV: RRR, good S1/S2, no murmur, cap refill <3 Resp: CTABL, no wheezes, non-labored Abd: SNTND, BS present, no guarding or organomegaly Ext: No edema, warm, normal tone, moves UE/LE spontaneously Neuro: Alert and oriented, No gross deficits Skin: no rashes no lesions  Assessment/Plan: See problem based a/p  Normal pregnancy,  repeat Pt. With positive pregnancy test today. Unintended but desirable pregnancy. She has irregular periods with last 02/2015. She does not know when in October. Wants to follow with Family Practice for this pregnancy - Positive pregnancy test today.  - Needs TSH given virilization / endocrine signs - Has PCOS 2/3 [place] criteria.  - She has a hx of GDM A1 - Uncontrolled HTN. Need to get this under control. Restarting medication today.  - Follow up for initial OB visit, labs, ultrasound    Hypertension Very poorly controlled. Pt. Noncompliant.  Mild headaches, but otherwise no end organ damage obvious at this time. She needs to get back on her medication. We discussed the importance of this, and the importance of diet.  - BMET, CBC today - HCTZ 25mg , Labetalol 200mg  BID for now given pregnancy - follow up in one week for nurse visit and HTN check - Will adjust medication as needed.   Cerumen impaction Cerumen impaction.  - Ears flushed today.   Health Maintenance - pap smear at next visit - flu shot - Needs lipids - HIV screening.

## 2015-06-03 NOTE — Assessment & Plan Note (Signed)
Pt. With positive pregnancy test today. Unintended but desirable pregnancy. She has irregular periods with last 02/2015. She does not know when in October. Wants to follow with Family Practice for this pregnancy - Positive pregnancy test today.  - Needs TSH given virilization / endocrine signs - Has PCOS 2/3 [place] criteria.  - She has a hx of GDM A1 - Uncontrolled HTN. Need to get this under control. Restarting medication today.  - Follow up for initial OB visit, labs, ultrasound

## 2015-06-04 ENCOUNTER — Telehealth: Payer: Self-pay | Admitting: Family Medicine

## 2015-06-04 NOTE — Telephone Encounter (Signed)
Called and left VM that lab results are normal. Will see her at her next office visit for new OB.   CGM MD

## 2015-06-15 ENCOUNTER — Other Ambulatory Visit: Payer: BLUE CROSS/BLUE SHIELD

## 2015-06-15 ENCOUNTER — Ambulatory Visit (INDEPENDENT_AMBULATORY_CARE_PROVIDER_SITE_OTHER): Payer: BLUE CROSS/BLUE SHIELD | Admitting: *Deleted

## 2015-06-15 VITALS — BP 134/82 | HR 81

## 2015-06-15 DIAGNOSIS — Z3481 Encounter for supervision of other normal pregnancy, first trimester: Secondary | ICD-10-CM

## 2015-06-15 DIAGNOSIS — Z0130 Encounter for examination of blood pressure without abnormal findings: Secondary | ICD-10-CM

## 2015-06-15 DIAGNOSIS — Z136 Encounter for screening for cardiovascular disorders: Secondary | ICD-10-CM

## 2015-06-15 NOTE — Progress Notes (Signed)
   Patient in nurse clinic for blood pressure check.  Patient denies chest pain, SOB, dizziness, headache or visual changes.  Patient took blood pressure medication per appointment this morning.  Will forward to PCP.  Derl Barrow, RN  Today's Vitals   06/15/15 0831  BP: 134/82  Pulse: 81  SpO2: 98%  PainSc: 0-No pain

## 2015-06-15 NOTE — Progress Notes (Signed)
New ob labs done today Araly Kaas 

## 2015-06-16 LAB — OBSTETRIC PANEL
ANTIBODY SCREEN: NEGATIVE
BASOS PCT: 0 % (ref 0–1)
Basophils Absolute: 0 10*3/uL (ref 0.0–0.1)
EOS ABS: 0.1 10*3/uL (ref 0.0–0.7)
EOS PCT: 1 % (ref 0–5)
HCT: 36.6 % (ref 36.0–46.0)
HEMOGLOBIN: 11.8 g/dL — AB (ref 12.0–15.0)
Hepatitis B Surface Ag: NEGATIVE
Lymphocytes Relative: 21 % (ref 12–46)
Lymphs Abs: 2.5 10*3/uL (ref 0.7–4.0)
MCH: 27.7 pg (ref 26.0–34.0)
MCHC: 32.2 g/dL (ref 30.0–36.0)
MCV: 85.9 fL (ref 78.0–100.0)
MPV: 10.4 fL (ref 8.6–12.4)
Monocytes Absolute: 0.6 10*3/uL (ref 0.1–1.0)
Monocytes Relative: 5 % (ref 3–12)
NEUTROS PCT: 73 % (ref 43–77)
Neutro Abs: 8.7 10*3/uL — ABNORMAL HIGH (ref 1.7–7.7)
Platelets: 378 10*3/uL (ref 150–400)
RBC: 4.26 MIL/uL (ref 3.87–5.11)
RDW: 14.2 % (ref 11.5–15.5)
RPR: REACTIVE — AB
Rh Type: POSITIVE
Rubella: 1.04 Index — ABNORMAL HIGH (ref <0.90)
WBC: 11.9 10*3/uL — ABNORMAL HIGH (ref 4.0–10.5)

## 2015-06-16 LAB — RPR TITER: RPR Titer: 1:2 {titer}

## 2015-06-16 LAB — HIV ANTIBODY (ROUTINE TESTING W REFLEX): HIV: NONREACTIVE

## 2015-06-16 LAB — SICKLE CELL SCREEN: SICKLE CELL SCREEN: NEGATIVE

## 2015-06-17 ENCOUNTER — Encounter (HOSPITAL_COMMUNITY): Payer: Self-pay | Admitting: *Deleted

## 2015-06-17 ENCOUNTER — Inpatient Hospital Stay (HOSPITAL_COMMUNITY)
Admission: AD | Admit: 2015-06-17 | Discharge: 2015-06-18 | Disposition: A | Discharge status: Home / Self Care | Payer: BLUE CROSS/BLUE SHIELD | Source: Ambulatory Visit | Attending: Obstetrics and Gynecology | Admitting: Obstetrics and Gynecology

## 2015-06-17 DIAGNOSIS — Z3A00 Weeks of gestation of pregnancy not specified: Secondary | ICD-10-CM | POA: Insufficient documentation

## 2015-06-17 DIAGNOSIS — O99891 Other specified diseases and conditions complicating pregnancy: Secondary | ICD-10-CM

## 2015-06-17 DIAGNOSIS — O9989 Other specified diseases and conditions complicating pregnancy, childbirth and the puerperium: Secondary | ICD-10-CM | POA: Diagnosis not present

## 2015-06-17 DIAGNOSIS — O26899 Other specified pregnancy related conditions, unspecified trimester: Secondary | ICD-10-CM | POA: Diagnosis not present

## 2015-06-17 DIAGNOSIS — O10919 Unspecified pre-existing hypertension complicating pregnancy, unspecified trimester: Secondary | ICD-10-CM | POA: Diagnosis not present

## 2015-06-17 DIAGNOSIS — M549 Dorsalgia, unspecified: Secondary | ICD-10-CM | POA: Insufficient documentation

## 2015-06-17 DIAGNOSIS — Z833 Family history of diabetes mellitus: Secondary | ICD-10-CM | POA: Diagnosis not present

## 2015-06-17 DIAGNOSIS — E281 Androgen excess: Secondary | ICD-10-CM | POA: Diagnosis not present

## 2015-06-17 DIAGNOSIS — Z79899 Other long term (current) drug therapy: Secondary | ICD-10-CM | POA: Diagnosis not present

## 2015-06-17 DIAGNOSIS — Z882 Allergy status to sulfonamides status: Secondary | ICD-10-CM | POA: Diagnosis not present

## 2015-06-17 DIAGNOSIS — O99280 Endocrine, nutritional and metabolic diseases complicating pregnancy, unspecified trimester: Secondary | ICD-10-CM | POA: Insufficient documentation

## 2015-06-17 LAB — URINALYSIS, ROUTINE W REFLEX MICROSCOPIC
Bilirubin Urine: NEGATIVE
Glucose, UA: NEGATIVE mg/dL
Hgb urine dipstick: NEGATIVE
Ketones, ur: NEGATIVE mg/dL
NITRITE: NEGATIVE
Protein, ur: NEGATIVE mg/dL
pH: 5.5 (ref 5.0–8.0)

## 2015-06-17 LAB — URINE MICROSCOPIC-ADD ON: RBC / HPF: NONE SEEN RBC/hpf (ref 0–5)

## 2015-06-17 LAB — CULTURE, OB URINE: Colony Count: 80000

## 2015-06-17 LAB — FLUORESCENT TREPONEMAL AB(FTA)-IGG-BLD: FLUORESCENT TREPONEMAL ABS: NONREACTIVE

## 2015-06-17 MED ORDER — LABETALOL HCL 100 MG PO TABS
200.0000 mg | ORAL_TABLET | Freq: Once | ORAL | Status: AC
  Administered 2015-06-17: 200 mg via ORAL
  Filled 2015-06-17: qty 2

## 2015-06-17 MED ORDER — CYCLOBENZAPRINE HCL 5 MG PO TABS: 5.0000 mg | ORAL_TABLET | Freq: Three times a day (TID) | ORAL | Status: DC | PRN

## 2015-06-17 MED ORDER — ACETAMINOPHEN 500 MG PO TABS: ORAL_TABLET | ORAL | Status: DC

## 2015-06-17 MED ORDER — ACETAMINOPHEN 500 MG PO TABS
1000.0000 mg | ORAL_TABLET | Freq: Once | ORAL | Status: AC
  Administered 2015-06-17: 1000 mg via ORAL
  Filled 2015-06-17: qty 2

## 2015-06-17 NOTE — MAU Note (Signed)
Pt states she has been having mid back pain x 4 days, never goes away but lessens at times. Pt states she just doesn't feel good. Also states she also just wants to see how far along she is.

## 2015-06-17 NOTE — MAU Note (Addendum)
Pt reports back pain since Saturday. Pt reports pain to be in middle of lower back. Pt denies vaginal bleeding or discharge. Pt denies fever. Last night pt felt dizzy, nauseate and "ick"... Almost like she was going to pass out.  Pt is concerned because she doesn't know how far along she is of if she may be having twins.

## 2015-06-17 NOTE — Discharge Instructions (Signed)

## 2015-06-17 NOTE — MAU Provider Note (Signed)
History     CSN: [id number]  Arrival date and time: 06/17/15 2035   First Provider Initiated Contact with Patient 06/17/15 2304      No chief complaint on file.  HPI Ms Lilienthal is a 40yo [phone] @ unknown gestation who presents for eval of mid back pain x 4d, feeling nauseous dizzy last evening, desirous of wanting to know EDC as well as ruling out a twin gestation. Denies fever, vag d/c or bldg. No abd pain or ctx. She has been seen for the pregnancy at [facility] on 1/25 and has a new OB visit/ultrasound coming up soon. She has a hx of GDM w/ the second pregnancy and also cHTN (restarted on Labetalol x 2wk ago). LMP October 2015.  OB History    Gravida Para Term Preterm AB TAB SAB Ectopic Multiple Living   3 2 2              Past Medical History  Diagnosis Date  . Bell's palsy   . Helicobacter pylori (H. pylori) infection   . Arthritis     bilateral knee  . Asthma   . Gestational diabetes   . Hypertension     Past Surgical History  Procedure Laterality Date  . Tonsillectomy and adenoidectomy    . Cryotherapy  1996    cervix    Family History  Problem Relation Age of Onset  . Diabetes Father   . Diabetes Sister   . Alcohol abuse Sister   . Colon cancer Paternal Grandfather   . Cancer Paternal Grandfather   . Stroke Paternal Grandfather   . Alcohol abuse Mother   . HIV Mother   . Diabetes Mother   . Dementia Maternal Grandmother   . Cancer Maternal Grandmother 90    colon,   . Cancer Maternal Grandfather   . Diabetes Maternal Grandfather   . Cancer Paternal Grandmother   . Heart disease Paternal Grandmother   . Diabetes Paternal Grandmother     Social History  Substance Use Topics  . Smoking status: Passive Smoke Exposure - Never Smoker  . Smokeless tobacco: Never Used  . Alcohol Use: No    Allergies:  Allergies  Allergen Reactions  . Sulfa Antibiotics Rash    Prescriptions prior to admission  Medication Sig Dispense Refill Last Dose  .  hydrochlorothiazide (HYDRODIURIL) 25 MG tablet Take 1 tablet (25 mg total) by mouth daily. 90 tablet 3 06/17/2015 at Unknown time  . labetalol (NORMODYNE) 200 MG tablet Take 1 tablet (200 mg total) by mouth 2 (two) times daily. 180 tablet 0 06/17/2015 at Unknown time  . Prenatal Vit-Min-FA-Fish Oil ([facility] PRENATAL GUMMY PO) Take by mouth.   Past Week at Unknown time  . acetaminophen (TYLENOL) 500 MG tablet Take 500 mg by mouth every 6 (six) hours as needed (for pain).   More than a month at Unknown time  . albuterol (PROVENTIL HFA;VENTOLIN HFA) 108 (90 BASE) MCG/ACT inhaler Inhale 2 puffs into the lungs every 6 (six) hours as needed for wheezing or shortness of breath.    More than a month at Unknown time  . amLODipine (NORVASC) 10 MG tablet TAKE 1 TABLET BY MOUTH DAILY (Patient not taking: Reported on 02/08/2015) 90 tablet 0 Not Taking  . ibuprofen (ADVIL,MOTRIN) 200 MG tablet Take 200 mg by mouth every 6 (six) hours as needed for moderate pain.    More than a month at Unknown time    ROS Physical Exam   BP 156/83 (has not had  evening dose Lab)  Blood pressure 156/83, pulse 76, temperature 98.6 F (37 C), temperature source Oral, resp. rate 20, height 5\' 3"  (1.6 m), weight 114.306 kg (252 lb), last menstrual period 02/16/2015, SpO2 100 %.  Physical Exam  Constitutional: She is oriented to person, place, and time. She appears well-developed.  Obese habitus  HENT:  Head: Normocephalic.  Neck: Normal range of motion.  Cardiovascular: Normal rate.   Respiratory: Effort normal.  GI:  FHT doppler 165  Genitourinary:  Neg CVAT  Musculoskeletal: Normal range of motion.  Neurological: She is alert and oriented to person, place, and time.  Skin: Skin is warm and dry.  Psychiatric: She has a normal mood and affect. Her behavior is normal. Thought content normal.   Urinalysis    Component Value Date/Time   COLORURINE YELLOW 06/17/2015 2120   APPEARANCEUR CLEAR 06/17/2015 2120   LABSPEC >1.030*  06/17/2015 2120   PHURINE 5.5 06/17/2015 2120   GLUCOSEU NEGATIVE 06/17/2015 2120   HGBUR NEGATIVE 06/17/2015 2120   BILIRUBINUR NEGATIVE 06/17/2015 2120   KETONESUR NEGATIVE 06/17/2015 2120   PROTEINUR NEGATIVE 06/17/2015 2120   UROBILINOGEN 1.0 11/22/2013 1850   NITRITE NEGATIVE 06/17/2015 2120   LEUKOCYTESUR TRACE* 06/17/2015 2120    Micro: 0-5SE, many bact  MAU Course  Procedures  MDM ES Tylenol #2, evening dose Lab 200mg   Assessment and Plan  Early preg cHTN Hyperandrogenism Mid-back pain  - D/C home w/ comfort measures for back pain: try ES Tylenol first along w/ heating pad, and then may try rx Flexeril - Continue w/ BP meds as scheduled - F/U w/ [facility] for OB appt  Avelina Mcclurkin, [place] 06/17/2015, 11:14 PM

## 2015-06-22 ENCOUNTER — Ambulatory Visit: Payer: BLUE CROSS/BLUE SHIELD | Admitting: Family Medicine

## 2015-06-26 ENCOUNTER — Encounter: Payer: Self-pay | Admitting: Family Medicine

## 2015-06-26 ENCOUNTER — Ambulatory Visit (INDEPENDENT_AMBULATORY_CARE_PROVIDER_SITE_OTHER): Payer: BLUE CROSS/BLUE SHIELD | Admitting: Family Medicine

## 2015-06-26 VITALS — BP 140/90 | HR 95 | Temp 98.6°F | Ht 63.0 in | Wt 254.0 lb

## 2015-06-26 DIAGNOSIS — O98111 Syphilis complicating pregnancy, first trimester: Secondary | ICD-10-CM | POA: Diagnosis not present

## 2015-06-26 DIAGNOSIS — Z3481 Encounter for supervision of other normal pregnancy, first trimester: Secondary | ICD-10-CM

## 2015-06-26 DIAGNOSIS — O09521 Supervision of elderly multigravida, first trimester: Secondary | ICD-10-CM | POA: Diagnosis not present

## 2015-06-26 DIAGNOSIS — I10 Essential (primary) hypertension: Secondary | ICD-10-CM | POA: Diagnosis not present

## 2015-06-26 MED ORDER — PENICILLIN G BENZATHINE 1200000 UNIT/2ML IM SUSP
1.2000 10*6.[IU] | Freq: Once | INTRAMUSCULAR | Status: AC
  Administered 2015-06-26: 1.2 10*6.[IU] via INTRAMUSCULAR

## 2015-06-26 NOTE — Progress Notes (Signed)
40 y/o G3P2002 at ?[redacted]W[redacted]D, history of irregular periods and likely PCOS, Chronic HTN, GDMA2, HLD, Advanced Maternal Age, here for first OB appointment. She says she is unsure exactly of when her last period was. She is currently doing well in her first trimester. No bleeding, LOF, or abdominal pain. Some mild back pain.   - She did have a positive RPR with 1:2 titer on prenatal screening blood work. History of previous positive RPR, but says she is unsure if she was treated or not. She denies any lesions, rashes, or known sexual contact with infected persons. Has been sexually active with her current partner for 5 years. Believes that he is monogamous. No known STD's in the past.  - Abnormal pap smear requiring COLPOSCOPY and LEEP (per pt. ) in 1996. Pap not up to date.  - She desires genetic screening - Positive urine culture, but denies dysuria, hematuria, frequency, or urgency.   Filed Vitals:   06/26/15 1445  BP: 140/90  Pulse: 95  Temp: 98.6 F (37 C)   Exam:  Gen: NAD, Obese HEENT: Hirsutism noted, NCAT, PERRLA, neck enlarged. No LAD Resp: CTA BL, appropriate rate, unlabored CV: RRR, no MGR, normal S1/S2 Abd: S, NT, ND, Obese, appropriately gravid, Doppler with +FHT.  Ext: WWP, 2+ distal pulses, No edema noted Skin: No rashes, no lesions, no palmar lesions.   A: 40 y/o G3P2002 here at ?[redacted]w[redacted]d High risk with HTN, Hx of GDMA2, AMA. Positive RPR with weak titer, Corynebacterium in the urine 80K CFU, and out of date pap smear with hx of abnormal pap.  - Needs referral to [facility] with HTN, hx of DGMA2, age - Desires genetic screening today.  - Follow up positive RPR with FTSA - Ab - Tx with penicillin G today.  - If FTSA-Ab negative, no further treatment.  - Positive urine culture Corynebacterium? > repeat urine culture and treat if still positive.  - Labetalol for HTN , HCTZ - Dating ultrasound - Needs repeated Pap smear.  - Follow up in one month here if unable to get in at [facility].    Paula Compton, MD Family Medicine -PGY 2

## 2015-06-26 NOTE — Addendum Note (Signed)
Addended by: Aquilla Hacker on: 06/26/2015 05:00 PM   Modules accepted: Orders

## 2015-06-26 NOTE — Addendum Note (Signed)
Addended by: Christen Bame D on: 06/26/2015 04:36 PM   Modules accepted: Orders

## 2015-06-26 NOTE — Patient Instructions (Addendum)
Thanks for coming in today.   We will let you know if you need to take an antibiotic for the bacteria in your urine after we get the repeat urine culture.   We treated you today for syphillis due the the mildly elevated blood test. We need to repeat the blood test, and if it is positive, then you will need more treatment. We will update you with this.   We have checked a genetic screen for your baby and will let you know those results when we have them.   We scheduled an ultrasound, and we referred you to High Risk OB clinic since you've had Hypertension and Gestational Diabetes with pregnancy in the past.   Thanks for letting us take care of you. You will be called with the appointments noted above, If you do not get a call then please call our clinic.   You should be seen for your next prenatal appointment in the next month, at [facility] or here if they are unable to schedule you there.   Thanks for letting us take care of you.   Sincerely,  Paula Compton, MD

## 2015-06-28 LAB — CULTURE, OB URINE: Colony Count: >=100000

## 2015-06-29 ENCOUNTER — Other Ambulatory Visit: Payer: BLUE CROSS/BLUE SHIELD

## 2015-06-29 ENCOUNTER — Other Ambulatory Visit: Payer: Self-pay | Admitting: Family Medicine

## 2015-06-29 ENCOUNTER — Ambulatory Visit (HOSPITAL_COMMUNITY)
Admission: RE | Admit: 2015-06-29 | Discharge: 2015-06-29 | Disposition: A | Discharge status: Home / Self Care | Payer: BLUE CROSS/BLUE SHIELD | Source: Ambulatory Visit | Attending: Family Medicine | Admitting: Family Medicine

## 2015-06-29 DIAGNOSIS — Z3A12 12 weeks gestation of pregnancy: Secondary | ICD-10-CM | POA: Insufficient documentation

## 2015-06-29 DIAGNOSIS — O98111 Syphilis complicating pregnancy, first trimester: Secondary | ICD-10-CM

## 2015-06-29 DIAGNOSIS — O09291 Supervision of pregnancy with other poor reproductive or obstetric history, first trimester: Secondary | ICD-10-CM | POA: Diagnosis not present

## 2015-06-29 DIAGNOSIS — O09521 Supervision of elderly multigravida, first trimester: Secondary | ICD-10-CM | POA: Insufficient documentation

## 2015-06-29 DIAGNOSIS — Z3481 Encounter for supervision of other normal pregnancy, first trimester: Secondary | ICD-10-CM

## 2015-06-29 DIAGNOSIS — O99211 Obesity complicating pregnancy, first trimester: Secondary | ICD-10-CM | POA: Diagnosis not present

## 2015-06-29 MED ORDER — CEFPODOXIME PROXETIL 100 MG PO TABS: 100.0000 mg | ORAL_TABLET | Freq: Two times a day (BID) | ORAL | Status: DC

## 2015-06-29 NOTE — Telephone Encounter (Signed)
OB urine culture positive twice. will send antibiotics for 10 day course. Patient called and made aware of need to take Antibiotic.   CGM MD

## 2015-06-30 ENCOUNTER — Telehealth: Payer: Self-pay | Admitting: *Deleted

## 2015-06-30 NOTE — Telephone Encounter (Signed)
Thanks. Quad screen was not drawn as we noticed that she was only 12 weeks. She will get a quad screen at 18 weeks. Called pt. To inform her of [place] results and plan regarding AFP. She is aware and planning to follow up as needed.   CGM MD

## 2015-06-30 NOTE — Telephone Encounter (Signed)
Maternal fetal called to inform us that the [place] that the pt had yesterday is way off from our expected date.  She stated that the Quad Screen was drawn to early.  She also stated that pt has an appointment scheduled for an anatomy US in about 6 weeks. She stated that she is approximately 12wks and 3 days.  Forwarding to PCP so he will know and can plan according to her new date. Katharina Caper, Iriel Nason D, [place]

## 2015-07-03 LAB — FLUORESCENT TREPONEMAL AB(FTA)-IGG-BLD: Fluorescent Treponemal ABS: NONREACTIVE

## 2015-07-06 ENCOUNTER — Telehealth: Payer: Self-pay | Admitting: Family Medicine

## 2015-07-06 ENCOUNTER — Ambulatory Visit: Payer: BLUE CROSS/BLUE SHIELD | Admitting: Family Medicine

## 2015-07-06 NOTE — Telephone Encounter (Signed)
Called pt. To inform her that FTSA test negative. No further treatment for Syphilis. Likely positive titer in pregnancy. May follow after pregnancy to see if she is positive at baseline.   CGM MD

## 2015-07-20 ENCOUNTER — Encounter: Payer: BLUE CROSS/BLUE SHIELD | Admitting: Family Medicine

## 2015-07-23 ENCOUNTER — Encounter: Payer: Self-pay | Admitting: Family

## 2015-07-23 ENCOUNTER — Ambulatory Visit (INDEPENDENT_AMBULATORY_CARE_PROVIDER_SITE_OTHER): Payer: BLUE CROSS/BLUE SHIELD | Admitting: Family

## 2015-07-23 VITALS — BP 127/67 | HR 78 | Temp 98.5°F | Wt 257.9 lb

## 2015-07-23 DIAGNOSIS — O24419 Gestational diabetes mellitus in pregnancy, unspecified control: Secondary | ICD-10-CM

## 2015-07-23 DIAGNOSIS — Z1151 Encounter for screening for human papillomavirus (HPV): Secondary | ICD-10-CM

## 2015-07-23 DIAGNOSIS — O10919 Unspecified pre-existing hypertension complicating pregnancy, unspecified trimester: Secondary | ICD-10-CM | POA: Insufficient documentation

## 2015-07-23 DIAGNOSIS — O0990 Supervision of high risk pregnancy, unspecified, unspecified trimester: Secondary | ICD-10-CM | POA: Insufficient documentation

## 2015-07-23 DIAGNOSIS — Z124 Encounter for screening for malignant neoplasm of cervix: Secondary | ICD-10-CM

## 2015-07-23 DIAGNOSIS — R899 Unspecified abnormal finding in specimens from other organs, systems and tissues: Secondary | ICD-10-CM

## 2015-07-23 DIAGNOSIS — R768 Other specified abnormal immunological findings in serum: Secondary | ICD-10-CM | POA: Insufficient documentation

## 2015-07-23 DIAGNOSIS — O10012 Pre-existing essential hypertension complicating pregnancy, second trimester: Secondary | ICD-10-CM

## 2015-07-23 DIAGNOSIS — Z8742 Personal history of other diseases of the female genital tract: Secondary | ICD-10-CM

## 2015-07-23 DIAGNOSIS — O0992 Supervision of high risk pregnancy, unspecified, second trimester: Secondary | ICD-10-CM

## 2015-07-23 HISTORY — DX: Unspecified pre-existing hypertension complicating pregnancy, unspecified trimester: O10.919

## 2015-07-23 LAB — POCT URINALYSIS DIP (DEVICE)
BILIRUBIN URINE: NEGATIVE
Glucose, UA: 100 mg/dL — AB
KETONES UR: NEGATIVE mg/dL
NITRITE: NEGATIVE
PH: 5 (ref 5.0–8.0)
Protein, ur: 30 mg/dL — AB
Specific Gravity, Urine: >=1.03 (ref 1.005–1.030)
Urobilinogen, UA: 0.2 mg/dL (ref 0.0–1.0)

## 2015-07-23 MED ORDER — ASPIRIN 81 MG PO CHEW: 81.0000 mg | CHEWABLE_TABLET | Freq: Every day | ORAL | Status: DC

## 2015-07-23 NOTE — Progress Notes (Signed)
Subjective:  Rita Lamb is a 40 y.o. G3P2002 at 8w4dbeing seen today for ongoing prenatal care.  She is currently monitored for the following issues for this high-risk pregnancy and has Asthma, intermittent; Hypertension; Obesity-class 3/Morbid; History of gestational diabetes; Astigmatism of left eye; Impaired glucose tolerance-last A1c 5.3; Hirsutism; Hyperlipidemia LDL goal < 100; Insulin resistance; Skin growth; Cerumen impaction; Irregular periods; Normal pregnancy, repeat; Hypertension in pregnancy, essential, antepartum; Supervision of high risk pregnancy, antepartum; Abnormal laboratory test result; and History of abnormal cervical Pap smear on her problem list.  Patient reports no complaints. States that in general she ahs a hard time sleeping on her back.   .  .  Movement: Present. Denies leaking of fluid.   Taking all medications.   The following portions of the patient's history were reviewed and updated as appropriate: allergies, current medications, past family history, past medical history, past social history, past surgical history and problem list. Problem list updated.  Objective:   Filed Vitals:   07/23/15 1014  BP: 127/67  Pulse: 78  Temp: 98.5 F (36.9 C)  Weight: 257 lb 14.4 oz (116.983 kg)    Fetal Status: Fetal Heart Rate (bpm): 154 Fundal Height: 17 cm Movement: Present     General:  Alert, oriented and cooperative. Patient is in no acute distress.  Skin: Skin is warm and dry. No rash noted.   Cardiovascular: Normal heart rate noted  Respiratory: Normal respiratory effort, no problems with respiration noted  Abdomen: Soft, gravid, appropriate for gestational age. Pain/Pressure: Present     Pelvic:       Cervical exam deferred        Extremities: Normal range of motion.     Mental Status: Normal mood and affect. Normal behavior. Normal judgment and thought content.   Urinalysis: Urine Protein: 1+ Urine Glucose: 2+  Assessment and Plan:  Pregnancy:  G3P2002 at 120w4d1. Gestational diabetes mellitus (GDM) in second trimester, gestational diabetes method of control unspecified H/o A2GDM. 1hr glucose today. Labs pending. Not currently on any medications.  - Glucose Tolerance, 1 HR (50g) w/o Fasting  2. Hypertension in pregnancy, essential, antepartum, second trimester One elevated BP in this pregnancy recorded (140/90). Discussed need to collect 24hr urine in this pregnancy. Collecting baseline preE labs. Patient can continue taking HCTZ and labetalol for BP control. Currently well controlled pressures on medications.  - Comp Met (CMET) - Protein, urine, 24 hour  3. Supervision of high risk pregnancy, antepartum, second trimester High risk due to gHTN and GDM. Follow-up [place] testing scheduled. Desires genetic screening which will be completed during scheduled [place]ment.   4. Abnormal laboratory test result Positive RPR on initial OB panel but patient with negative treponemal. Did receive Pen G x1. No further treatment needed.  5. History of abnormal cervical Pap smear Repeat pap today done. Patient with h/o cryotherapy and scar tissue. - Cytology - PAP  6. Irregular menses. Dating [place]. EDD based off this. Patient has h/o irregular menses so is hard to date. Last LMP was in October 2016.   Preterm labor symptoms and general obstetric precautions including but not limited to vaginal bleeding, contractions, leaking of fluid and fetal movement were reviewed in detail with the patient. Please refer to After Visit Summary for other counseling recommendations.  Return in about 3 weeks (around 08/13/2015).   JaKatheren ShamsDO

## 2015-07-23 NOTE — Progress Notes (Signed)
Declined Flu  1 gtt given  Pap Smear needed today

## 2015-07-23 NOTE — Progress Notes (Signed)
I examined pt and assisted with interviewing patient and agree with documentation above and resident plan of care.  In addition, consulted with Dr. Nehemiah Settle regarding continuing HCTZ in pregnancy.

## 2015-07-24 ENCOUNTER — Telehealth: Payer: Self-pay | Admitting: *Deleted

## 2015-07-24 LAB — GLUCOSE TOLERANCE, 1 HOUR (50G) W/O FASTING: Glucose, 1 Hr, gestational: 157 mg/dL — ABNORMAL HIGH

## 2015-07-24 NOTE — Telephone Encounter (Signed)
Called patient and relayed 1hr gtt failed, need for 3hr test. Patient said she expected this as it has happened with both her other pregnancies and she does not want to take the 3hr test. Stated it will "put me in the hospital like the last two times". Advised patient that it is an important test due to the effects diabetes can have on the unborn fetus and we need to know if she truly is diabetic so that she can be treated. Patient voiced understanding but still did not want to schedule a 3 hr gtt. I told her I would send a message to her provider to make her aware and her plan of care could be updated. Patient voiced understanding.

## 2015-07-27 LAB — COMPREHENSIVE METABOLIC PANEL
ALBUMIN: 3.3 g/dL — AB (ref 3.6–5.1)
ALT: 8 U/L (ref 6–29)
AST: 8 U/L — AB (ref 10–30)
Alkaline Phosphatase: 80 U/L (ref 33–115)
BILIRUBIN TOTAL: 0.3 mg/dL (ref 0.2–1.2)
BUN: 8 mg/dL (ref 7–25)
CHLORIDE: 99 mmol/L (ref 98–110)
CO2: 24 mmol/L (ref 20–31)
CREATININE: 0.53 mg/dL (ref 0.50–1.10)
Calcium: 8.9 mg/dL (ref 8.6–10.2)
GLUCOSE: 163 mg/dL — AB (ref 65–99)
Potassium: 4.1 mmol/L (ref 3.5–5.3)
SODIUM: 134 mmol/L — AB (ref 135–146)
Total Protein: 6.5 g/dL (ref 6.1–8.1)

## 2015-07-28 LAB — PROTEIN, URINE, 24 HOUR
PROTEIN 24H UR: 191 mg/(24.h) — AB (ref <150)
PROTEIN, URINE: 15 mg/dL (ref 5–24)

## 2015-07-28 LAB — CYTOLOGY - PAP

## 2015-08-05 ENCOUNTER — Encounter (HOSPITAL_COMMUNITY): Payer: Self-pay | Admitting: Family Medicine

## 2015-08-13 ENCOUNTER — Encounter: Payer: Self-pay | Admitting: Obstetrics & Gynecology

## 2015-08-13 ENCOUNTER — Ambulatory Visit (INDEPENDENT_AMBULATORY_CARE_PROVIDER_SITE_OTHER): Payer: BLUE CROSS/BLUE SHIELD | Admitting: Obstetrics & Gynecology

## 2015-08-13 VITALS — BP 119/69 | HR 88 | Temp 98.2°F | Wt 252.9 lb

## 2015-08-13 DIAGNOSIS — O99212 Obesity complicating pregnancy, second trimester: Secondary | ICD-10-CM

## 2015-08-13 DIAGNOSIS — O99210 Obesity complicating pregnancy, unspecified trimester: Secondary | ICD-10-CM | POA: Insufficient documentation

## 2015-08-13 DIAGNOSIS — O0992 Supervision of high risk pregnancy, unspecified, second trimester: Secondary | ICD-10-CM

## 2015-08-13 DIAGNOSIS — O24419 Gestational diabetes mellitus in pregnancy, unspecified control: Secondary | ICD-10-CM | POA: Diagnosis not present

## 2015-08-13 DIAGNOSIS — E669 Obesity, unspecified: Secondary | ICD-10-CM

## 2015-08-13 DIAGNOSIS — Z8632 Personal history of gestational diabetes: Secondary | ICD-10-CM

## 2015-08-13 DIAGNOSIS — O09292 Supervision of pregnancy with other poor reproductive or obstetric history, second trimester: Secondary | ICD-10-CM

## 2015-08-13 DIAGNOSIS — O10012 Pre-existing essential hypertension complicating pregnancy, second trimester: Secondary | ICD-10-CM

## 2015-08-13 LAB — POCT URINALYSIS DIP (DEVICE)
BILIRUBIN URINE: NEGATIVE
HGB URINE DIPSTICK: NEGATIVE
KETONES UR: NEGATIVE mg/dL
LEUKOCYTES UA: NEGATIVE
NITRITE: NEGATIVE
PH: 5 (ref 5.0–8.0)
PROTEIN: NEGATIVE mg/dL
Specific Gravity, Urine: >=1.03 (ref 1.005–1.030)
Urobilinogen, UA: 0.2 mg/dL (ref 0.0–1.0)

## 2015-08-13 LAB — TSH: TSH: 2.23 m[IU]/L

## 2015-08-13 NOTE — Patient Instructions (Signed)
Return to clinic for any scheduled appointments or obstetric concerns, or go to [facility] for evaluation  

## 2015-08-13 NOTE — Progress Notes (Signed)
Subjective:  Rita Lamb is a 40 y.o. G3P2002 at [redacted]w[redacted]d being seen today for ongoing prenatal care.  She is currently monitored for the following issues for this high-risk pregnancy and has Asthma, intermittent; Hypertension; Obesity-class 3/Morbid; History of gestational diabetes in prior pregnancy, currently pregnant; Astigmatism of left eye; Impaired glucose tolerance-last A1c 5.3; Hirsutism; Hyperlipidemia LDL goal < 100; Skin growth; Cerumen impaction; Irregular periods; Hypertension in pregnancy, essential, antepartum; Supervision of high risk pregnancy, antepartum; Abnormal laboratory test result; History of abnormal cervical Pap smear; Gestational diabetes mellitus, antepartum; and Obesity in pregnancy, antepartum on her problem list.  Patient reports no complaints.  Contractions: Not present. Vag. Bleeding: None.  Movement: Present. Denies leaking of fluid.   The following portions of the patient's history were reviewed and updated as appropriate: allergies, current medications, past family history, past medical history, past social history, past surgical history and problem list. Problem list updated.  Objective:   Filed Vitals:   08/13/15 0944  BP: 119/69  Pulse: 88  Temp: 98.2 F (36.8 C)  Weight: 252 lb 14.4 oz (114.715 kg)    Fetal Status: Fetal Heart Rate (bpm): 160   Movement: Present     General:  Alert, oriented and cooperative. Patient is in no acute distress.  Skin: Skin is warm and dry. No rash noted.   Cardiovascular: Normal heart rate noted  Respiratory: Normal respiratory effort, no problems with respiration noted  Abdomen: Soft, gravid, appropriate for gestational age. Pain/Pressure: Absent     Pelvic: Vag. Bleeding: None    Cervical exam deferred        Extremities: Normal range of motion.  Edema: None  Mental Status: Normal mood and affect. Normal behavior. Normal judgment and thought content.   Urinalysis: Urine Protein: Negative Urine Glucose:  4+  Assessment and Plan:  Pregnancy: G3P2002 at [redacted]w[redacted]d  1. History of gestational diabetes in prior pregnancy, currently pregnant, second trimester 2. Gestational diabetes mellitus, antepartum 1 hr GTT 157. Had GDM last pregnancy.  Reports having to be observed in hospital after previous 3 hr GTT, refuses to take it this time.  4+ glucosuria today. Will treat as A2/B GDM [X]  Baseline labs (CBC normal, CMP normal, 24 hr protein normal, TSH ?, HgA1C?) [X]  Aspirin 81 mg daily prescribed after 12 weeks []  Fetal ECHO - ordered [ ]  Eye exam - to be scheduled by patient [ ]  Serial growth scans 20-24-28-32-35-38 [ ]  Antenatal testing starting at 32 weeks [ ]  Delivery by 39 weeks or earlier if needed Discussed implications of DM in pregnancy, need for optimizing glycemic control to decrease DM associated maternal-fetal morbidity and mortality, need for antenatal testing and frequent ultrasounds/prenatal visits. Will check baseline labs today, get DM education and DM testing supplies today. - Hemoglobin A1c - TSH - US Fetal Echocardiography; Future  3. Hypertension in pregnancy, essential, antepartum, second trimester Stable BP on Labetalol and HCTZ.   4. Obesity in pregnancy, antepartum, second trimester Will meet with Nutritionist  5. Supervision of high risk pregnancy, antepartum, second trimester Routine obstetric precautions reviewed. Please refer to After Visit Summary for other counseling recommendations.  Return in about 4 days (around 08/17/2015) for Diabetic education and supplies.  OB visit on 08/24/15.   Osborne Oman, MD

## 2015-08-13 NOTE — Progress Notes (Signed)
Fetal echo scheduled 05/09 @ 830am with Dr. Filbert Schilder

## 2015-08-14 ENCOUNTER — Other Ambulatory Visit (HOSPITAL_COMMUNITY): Payer: Self-pay | Admitting: Obstetrics and Gynecology

## 2015-08-14 DIAGNOSIS — O99212 Obesity complicating pregnancy, second trimester: Secondary | ICD-10-CM

## 2015-08-14 DIAGNOSIS — Z3A19 19 weeks gestation of pregnancy: Secondary | ICD-10-CM

## 2015-08-14 DIAGNOSIS — O09522 Supervision of elderly multigravida, second trimester: Secondary | ICD-10-CM

## 2015-08-14 LAB — HEMOGLOBIN A1C
Hgb A1c MFr Bld: 6.5 % — ABNORMAL HIGH (ref <5.7)
Mean Plasma Glucose: 140 mg/dL

## 2015-08-17 ENCOUNTER — Ambulatory Visit: Payer: BLUE CROSS/BLUE SHIELD | Admitting: *Deleted

## 2015-08-17 ENCOUNTER — Encounter: Payer: BLUE CROSS/BLUE SHIELD | Attending: Obstetrics and Gynecology | Admitting: *Deleted

## 2015-08-17 ENCOUNTER — Encounter (HOSPITAL_COMMUNITY): Payer: Self-pay

## 2015-08-17 ENCOUNTER — Ambulatory Visit (HOSPITAL_COMMUNITY)
Admission: RE | Admit: 2015-08-17 | Discharge: 2015-08-17 | Disposition: A | Discharge status: Home / Self Care | Payer: BLUE CROSS/BLUE SHIELD | Source: Ambulatory Visit | Attending: Family Medicine | Admitting: Family Medicine

## 2015-08-17 ENCOUNTER — Ambulatory Visit (HOSPITAL_COMMUNITY)
Admission: RE | Admit: 2015-08-17 | Discharge: 2015-08-17 | Disposition: A | Discharge status: Home / Self Care | Payer: BLUE CROSS/BLUE SHIELD | Source: Ambulatory Visit | Attending: Obstetrics & Gynecology | Admitting: Obstetrics & Gynecology

## 2015-08-17 VITALS — BP 129/71 | HR 82 | Wt 261.8 lb

## 2015-08-17 DIAGNOSIS — Z8742 Personal history of other diseases of the female genital tract: Secondary | ICD-10-CM

## 2015-08-17 DIAGNOSIS — O24419 Gestational diabetes mellitus in pregnancy, unspecified control: Secondary | ICD-10-CM

## 2015-08-17 DIAGNOSIS — O09292 Supervision of pregnancy with other poor reproductive or obstetric history, second trimester: Secondary | ICD-10-CM | POA: Diagnosis not present

## 2015-08-17 DIAGNOSIS — Z029 Encounter for administrative examinations, unspecified: Secondary | ICD-10-CM | POA: Diagnosis present

## 2015-08-17 DIAGNOSIS — O99212 Obesity complicating pregnancy, second trimester: Secondary | ICD-10-CM

## 2015-08-17 DIAGNOSIS — Z315 Encounter for genetic counseling: Secondary | ICD-10-CM | POA: Insufficient documentation

## 2015-08-17 DIAGNOSIS — Z36 Encounter for antenatal screening of mother: Secondary | ICD-10-CM | POA: Diagnosis not present

## 2015-08-17 DIAGNOSIS — O10012 Pre-existing essential hypertension complicating pregnancy, second trimester: Secondary | ICD-10-CM

## 2015-08-17 DIAGNOSIS — O09522 Supervision of elderly multigravida, second trimester: Secondary | ICD-10-CM | POA: Diagnosis not present

## 2015-08-17 DIAGNOSIS — R899 Unspecified abnormal finding in specimens from other organs, systems and tissues: Secondary | ICD-10-CM

## 2015-08-17 DIAGNOSIS — O24812 Other pre-existing diabetes mellitus in pregnancy, second trimester: Secondary | ICD-10-CM | POA: Diagnosis not present

## 2015-08-17 DIAGNOSIS — O0992 Supervision of high risk pregnancy, unspecified, second trimester: Secondary | ICD-10-CM

## 2015-08-17 DIAGNOSIS — Z3A19 19 weeks gestation of pregnancy: Secondary | ICD-10-CM

## 2015-08-17 DIAGNOSIS — O09529 Supervision of elderly multigravida, unspecified trimester: Secondary | ICD-10-CM | POA: Insufficient documentation

## 2015-08-17 MED ORDER — BAYER CONTOUR MONITOR W/DEVICE KIT: 1.0000 | PACK | Freq: Four times a day (QID) | Status: DC

## 2015-08-17 MED ORDER — GLUCOSE BLOOD VI STRP: ORAL_STRIP | Status: DC

## 2015-08-17 MED ORDER — BAYER MICROLET LANCETS MISC: Status: DC

## 2015-08-17 NOTE — Progress Notes (Signed)
  Patient was seen on 08/17/15 for Gestational Diabetes self-management . Patient has a history of GDM. Present A1c 6.5% . Order for [employer] and testing supplies sent to pharmacy. The following learning objectives were met by the patient :   States the definition of Gestational Diabetes  States when to check blood glucose levels  Demonstrates proper blood glucose monitoring techniques  States the effect of stress and exercise on blood glucose levels  States the importance of limiting caffeine and abstaining from alcohol and smoking  Plan:  Consider  increasing your activity level by walking daily as tolerated Begin checking BG before breakfast and 2 hours after first bit of breakfast, lunch and dinner after  as directed by MD  Take medication  as directed by MD  Patient instructed to monitor glucose levels: FBS: 60 - <90 2 hour: <120  Patient received the following handouts:  Nutrition Diabetes and Pregnancy  Carbohydrate Counting List  Meal Planning worksheet  Patient will be seen for follow-up as needed.

## 2015-08-17 NOTE — Progress Notes (Signed)
Genetic Counseling  High-Risk Gestation Note  Appointment Date:  08/17/2015 Referred By: Aquilla Hacker, MD Date of Birth:  July 04, 1975 Partner:  Rita Lamb   Pregnancy History: [id number] Estimated Date of Delivery: 01/10/16 Estimated Gestational Age: [redacted]w[redacted]d Attending: Renella Cunas, MD    Rita Lamb was seen for genetic counseling because of a maternal age of 40 y.o..     In Summary:   Discussed risk for fetal aneuploidy related to maternal age of 40 years old at delivery  Detailed ultrasound today within normal range  Fetal echocardiogram scheduled with [facility] per patient report  Declined Quad screen, NIPS, and amniocentesis  Follow-up ultrasound scheduled in 6 weeks  Family history significant for sickle cell anemia for father of the pregnancy's aunts  Patient has had normal screening for Hemoglobin S, declined hemoglobin electrophoresis today  Father of the pregnancy reportedly also had negative screen for sickle cell trait   She was counseled regarding maternal age and the association with risk for chromosome conditions due to nondisjunction with aging of the ova.  We reviewed chromosomes, nondisjunction, and the associated 1 in 54 risk for fetal aneuploidy related to a maternal age of 40 y.o. at [redacted]w[redacted]d gestation.  She was counseled that the risk for aneuploidy decreases as gestational age increases, accounting for those pregnancies which spontaneously abort.  We specifically discussed Down syndrome (trisomy 89), trisomies 18 and 91, and sex chromosome aneuploidies (47,XXX and 47,XXY) including the common features and prognoses of each.   We reviewed available screening options including Quad screen, noninvasive prenatal screening (NIPS)/cell free DNA (cfDNA) testing, and detailed ultrasound.  She was counseled that screening tests are used to modify a patient's a priori risk for aneuploidy, typically based on age. This estimate provides a pregnancy specific risk  assessment. We reviewed the benefits and limitations of each option. Specifically, we discussed the conditions for which each test screens, the detection rates, and false positive rates of each. She was also counseled regarding diagnostic testing via amniocentesis. We reviewed the approximate 1 in [id number] risk for complications for amniocentesis, including spontaneous pregnancy loss. We discussed the possible results that the tests might provide including: positive, negative, unanticipated, and no result. Finally, they were counseled regarding the cost of each option and potential out of pocket expenses.  After consideration of all the options, she declined Quad screen, NIPS, and amniocentesis.   The patient also expressed interest in having a detailed ultrasound.  A complete ultrasound was performed today. The ultrasound report will be sent under separate cover. There were no visualized fetal anomalies or markers suggestive of aneuploidy. She understands that screening tests cannot rule out all birth defects or genetic syndromes. The patient was advised of this limitation and states she still does not want additional testing at this time.   Both family histories were reviewed and found to be contributory for sickle cell anemia for two maternal aunts for the father of the pregnancy. Both aunts are deceased. The patient reported that the father of the pregnancy does not have sickle cell trait. Medical records were not available to [place] today to confirm this report or to confirm if he was screened for all hemoglobin variants in addition to hemoglobin S (sickle cell trait).  Rita Lamb was provided with written information regarding sickle cell anemia (SCA) including the carrier frequency and incidence in the African-American population, the availability of carrier testing and prenatal diagnosis if indicated.  In addition, we discussed that hemoglobinopathies are routinely  screened for as part of the [place]  newborn screening panel. She previously had hemoglobin solubility, which was normal, indicating that she does not have sickle cell trait. We reviewed that screening for additional less common hemoglobin variants, such as hemoglobin C, is available via hemoglobin electrophoresis.  She declined hemoglobin electrophoresis today.  Additionally, the patient reported multiple relatives with mental health conditions including her mother and sister with bipolar disorder, and her brother with a type of mental illness. We discussed that for the majority of cases of mental health conditions, such as bipolar disorder, an underlying genetic cause is not known but a combination of genetic and environmental factors (multifactorial inheritance) are suspected to contribute to their onset.  Recurrence risk of bipolar for second degree relatives would be slightly increased above the general population risk, in the case of multifactorial inheritance observed. In some families, mental health conditions may even be dominant, meaning that when one parent has the condition each child could have up to a 50% risk to inherit the condition. We discussed that it might be helpful for pediatricians to be aware of this family history to ensure that family members are followed appropriately. The patient understands that prenatal testing or screening is not available for the majority of mental health conditions. Without further information regarding the provided family history, an accurate genetic risk cannot be calculated. Further genetic counseling is warranted if more information is obtained.  Rita Lamb denied exposure to environmental toxins or chemical agents. She denied the use of alcohol, tobacco or street drugs. She denied significant viral illnesses during the course of her pregnancy. Her medical and surgical histories were contributory for gestational diabetes and hypertension. She is currently taking medication for hypertension and  is scheduled to diabetes education today. Women who have insulin dependent diabetes are at an increased risk to have a baby with a birth defect.  The increase in risk correlates with the level of blood sugar control during the pregnancy, particularly during organogenesis.  The increase in risk is for any type of birth defect but is greatest for heart, limb, and neural tube defects.  The risk could be as high as 6-10% for individuals whose blood sugars are not well-controlled, but lower for women who have good blood sugar control throughout pregnancy. Patient reported that fetal echocardiogram is scheduled in May with Dr. Filbert Schilder with [facility].   I counseled Rita Lamb regarding the above risks and available options.  The approximate face-to-face time with the genetic counselor was 45 minutes.  Chipper Oman, MS,  Certified Genetic Counselor 08/17/2015

## 2015-08-18 ENCOUNTER — Other Ambulatory Visit: Payer: Self-pay | Admitting: Family Medicine

## 2015-08-20 ENCOUNTER — Other Ambulatory Visit (HOSPITAL_COMMUNITY): Payer: Self-pay | Admitting: *Deleted

## 2015-08-20 DIAGNOSIS — O10919 Unspecified pre-existing hypertension complicating pregnancy, unspecified trimester: Secondary | ICD-10-CM

## 2015-08-24 ENCOUNTER — Ambulatory Visit (INDEPENDENT_AMBULATORY_CARE_PROVIDER_SITE_OTHER): Payer: Self-pay | Admitting: Obstetrics & Gynecology

## 2015-08-24 VITALS — BP 126/65 | HR 87 | Temp 98.9°F | Wt 262.6 lb

## 2015-08-24 DIAGNOSIS — Z8632 Personal history of gestational diabetes: Principal | ICD-10-CM

## 2015-08-24 DIAGNOSIS — O99212 Obesity complicating pregnancy, second trimester: Secondary | ICD-10-CM

## 2015-08-24 DIAGNOSIS — O09292 Supervision of pregnancy with other poor reproductive or obstetric history, second trimester: Secondary | ICD-10-CM

## 2015-08-24 LAB — POCT URINALYSIS DIP (DEVICE)
GLUCOSE, UA: NEGATIVE mg/dL
Ketones, ur: NEGATIVE mg/dL
NITRITE: NEGATIVE
Protein, ur: 30 mg/dL — AB
Specific Gravity, Urine: >=1.03 (ref 1.005–1.030)
UROBILINOGEN UA: 1 mg/dL (ref 0.0–1.0)
pH: 5.5 (ref 5.0–8.0)

## 2015-08-24 MED ORDER — GLYBURIDE 2.5 MG PO TABS: 2.5000 mg | ORAL_TABLET | Freq: Two times a day (BID) | ORAL | Status: DC

## 2015-08-24 NOTE — Progress Notes (Signed)
Breastfeeding tip of the week reviewed. 

## 2015-08-24 NOTE — Progress Notes (Signed)
Nutrition note: GDM diet education Pt has h/o obesity & has GDM. Pt has gained 12.6# @ [redacted]w[redacted]d, which is > expected. Pt has been checking her BS- fasting: 112-279, 2hr pp: [id number] - doctor plans to start her on Glyburide.  Pt reports eating 3 meals & 1-2 snacks/d. Pt is taking gummy prenatal vitamins. Pt reports no N&V but has some heartburn. Pt is allergic to shrimp. Pt reports she is active at work but only walks ~5-10 mins when not at work. Pt received verbal & written education on GDM diet. Discussed tips to decrease heartburn. Encouraged ~30 mins of walking/d. Encouraged pt to look at gummy prenatal vitamins to make sure they have iron & if not to switch to 2 chewable multivitamins. Discussed wt gain goals of 11-20# or 0.5#/wk. Pt agrees to follow GDM diet with 3 meals & 1-3 snacks/d with proper CHO/ protein combination. Pt has [place] & plans to BF. F/u in 4-6 wks Vladimir Faster, MS, RD, LDN, [facility]

## 2015-08-24 NOTE — Progress Notes (Signed)
Subjective:  Rita Lamb is a 40 y.o. G3P2002 at [redacted]w[redacted]d being seen today for ongoing prenatal care.  She is currently monitored for the following issues for this high-risk pregnancy and has Asthma, intermittent; Hypertension; Obesity-class 3/Morbid; History of gestational diabetes in prior pregnancy, currently pregnant; Astigmatism of left eye; Impaired glucose tolerance-last A1c 5.3; Hirsutism; Hyperlipidemia LDL goal < 100; Skin growth; Cerumen impaction; Irregular periods; Hypertension in pregnancy, essential, antepartum; Supervision of high risk pregnancy, antepartum; Abnormal laboratory test result; History of abnormal cervical Pap smear; Gestational diabetes mellitus, antepartum; Obesity in pregnancy, antepartum; and Advanced maternal age in multigravida on her problem list.  Patient reports indigestion.  Contractions: Not present. Vag. Bleeding: None.  Movement: Present. Denies leaking of fluid.   The following portions of the patient's history were reviewed and updated as appropriate: allergies, current medications, past family history, past medical history, past social history, past surgical history and problem list. Problem list updated.  Objective:   Filed Vitals:   08/24/15 0855  BP: 126/65  Pulse: 87  Temp: 98.9 F (37.2 C)  Weight: 262 lb 9.6 oz (119.115 kg)    Fetal Status: Fetal Heart Rate (bpm): 150   Movement: Present     General:  Alert, oriented and cooperative. Patient is in no acute distress.  Skin: Skin is warm and dry. No rash noted.   Cardiovascular: Normal heart rate noted  Respiratory: Normal respiratory effort, no problems with respiration noted  Abdomen: Soft, gravid, appropriate for gestational age. Pain/Pressure: Absent     Pelvic: Vag. Bleeding: None     Cervical exam deferred        Extremities: Normal range of motion.  Edema: None  Mental Status: Normal mood and affect. Normal behavior. Normal judgment and thought content.   Urinalysis:       Assessment and Plan:  Pregnancy: G3P2002 at [redacted]w[redacted]d  1. History of gestational diabetes in prior pregnancy, currently pregnant, second trimester Poor control FBS and PP out of range up to 383   Preterm labor symptoms and general obstetric precautions including but not limited to vaginal bleeding, contractions, leaking of fluid and fetal movement were reviewed in detail with the patient. Please refer to After Visit Summary for other counseling recommendations.  1 week f/u Glyburide 2.5 mg BID  Woodroe Mode, MD

## 2015-08-24 NOTE — Patient Instructions (Signed)
Gestational Diabetes Mellitus  Gestational diabetes mellitus, often simply referred to as gestational diabetes, is a type of diabetes that some women develop during pregnancy. In gestational diabetes, the pancreas does not make enough insulin (a hormone), the cells are less responsive to the insulin that is made (insulin resistance), or both. Normally, insulin moves sugars from food into the tissue cells. The tissue cells use the sugars for energy. The lack of insulin or the lack of normal response to insulin causes excess sugars to build up in the blood instead of going into the tissue cells. As a result, high blood sugar (hyperglycemia) develops. The effect of high sugar (glucose) levels can cause many problems.   RISK FACTORS  You have an increased chance of developing gestational diabetes if you have a family history of diabetes and also have one or more of the following risk factors:  · A body mass index over 30 (obesity).  · A previous pregnancy with gestational diabetes.  · An older age at the time of pregnancy.  If blood glucose levels are kept in the normal range during pregnancy, women can have a healthy pregnancy. If your blood glucose levels are not well controlled, there may be risks to you, your unborn baby (fetus), your labor and delivery, or your newborn baby.   SYMPTOMS   If symptoms are experienced, they are much like symptoms you would normally expect during pregnancy. The symptoms of gestational diabetes include:   · Increased thirst (polydipsia).  · Increased urination (polyuria).  · Increased urination during the night (nocturia).  · Weight loss. This weight loss may be rapid.  · Frequent, recurring infections.  · Tiredness (fatigue).  · Weakness.  · Vision changes, such as blurred vision.  · Fruity smell to your breath.  · Abdominal pain.  DIAGNOSIS  Diabetes is diagnosed when blood glucose levels are increased. Your blood glucose level may be checked by one or more of the following blood  tests:  · A fasting blood glucose test. You will not be allowed to eat for at least 8 hours before a blood sample is taken.  · A random blood glucose test. Your blood glucose is checked at any time of the day regardless of when you ate.  · An oral glucose tolerance test (OGTT). Your blood glucose is measured after you have not eaten (fasted) for 1-3 hours and then after you drink a glucose-containing beverage. Since the hormones that cause insulin resistance are highest at about 24-28 weeks of a pregnancy, an OGTT is usually performed during that time. If you have risk factors, you may be screened for undiagnosed type 2 diabetes at your first prenatal visit.  TREATMENT   Gestational diabetes should be managed first with diet and exercise. Medicines may be added only if they are needed.  · You will need to take diabetes medicine or insulin daily to keep blood glucose levels in the desired range.  · You will need to match insulin dosing with exercise and healthy food choices.  If you have gestational diabetes, your treatment goal is to maintain the following blood glucose levels:  · Before meals (preprandial): at or below 95 mg/dL.  · After meals (postprandial):    One hour after a meal: at or below 140 mg/dL.    Two hours after a meal: at or below 120 mg/dL.  If you have pre-existing type 1 or type 2 diabetes, your treatment goal is to maintain the following blood glucose levels:  · Before   meals, at bedtime, and overnight: 60-99 mg/dL.  · After meals: peak of 100-129 mg/dL.  HOME CARE INSTRUCTIONS   · Have your hemoglobin A1c level checked twice a year.  · Perform daily blood glucose monitoring as directed by your health care provider. It is common to perform frequent blood glucose monitoring.  · Monitor urine ketones when you are ill and as directed by your health care provider.  · Take your diabetes medicine and insulin as directed by your health care provider to maintain your blood glucose level in the desired  range.  ¨ Never run out of diabetes medicine or insulin. It is needed every day.  ¨ Adjust insulin based on your intake of carbohydrates. Carbohydrates can raise blood glucose levels but need to be included in your diet. Carbohydrates provide vitamins, minerals, and fiber which are an essential part of a healthy diet. Carbohydrates are found in fruits, vegetables, whole grains, dairy products, legumes, and foods containing added sugars.  · Eat healthy foods. Alternate 3 meals with 3 snacks.  · Maintain a healthy weight gain. The usual total expected weight gain varies according to your prepregnancy body mass index (BMI).  · Carry a medical alert card or wear your medical alert jewelry.  · Carry a 15-gram carbohydrate snack with you at all times to treat low blood glucose (hypoglycemia). Some examples of 15-gram carbohydrate snacks include:  ¨ Glucose tablets, 3 or 4.  ¨ Glucose gel, 15-gram tube.  ¨ Raisins, 2 tablespoons (24 g).  ¨ Jelly beans, 6.  ¨ Animal crackers, 8.  ¨ Fruit juice, regular soda, or low-fat milk, 4 ounces (120 mL).  ¨ Gummy treats, 9.  · Recognize hypoglycemia. Hypoglycemia during pregnancy occurs with blood glucose levels of 60 mg/dL and below. The risk for hypoglycemia increases when fasting or skipping meals, during or after intense exercise, and during sleep. Hypoglycemia symptoms can include:  ¨ Tremors or shakes.  ¨ Decreased ability to concentrate.  ¨ Sweating.  ¨ Increased heart rate.  ¨ Headache.  ¨ Dry mouth.  ¨ Hunger.  ¨ Irritability.  ¨ Anxiety.  ¨ Restless sleep.  ¨ Altered speech or coordination.  ¨ Confusion.  · Treat hypoglycemia promptly. If you are alert and able to safely swallow, follow the 15:15 rule:  ¨ Take 15-20 grams of rapid-acting glucose or carbohydrate. Rapid-acting options include glucose gel, glucose tablets, or 4 ounces (120 mL) of fruit juice, regular soda, or low-fat milk.  ¨ Check your blood glucose level 15 minutes after taking the glucose.  ¨ Take 15-20  grams more of glucose if the repeat blood glucose level is still 70 mg/dL or below.  ¨ Eat a meal or snack within 1 hour once blood glucose levels return to normal.  · Be alert to polyuria (excess urination) and polydipsia (excess thirst) which are early signs of hyperglycemia. An early awareness of hyperglycemia allows for prompt treatment. Treat hyperglycemia as directed by your health care provider.  · Engage in at least 30 minutes of physical activity a day or as directed by your health care provider. Ten minutes of physical activity timed 30 minutes after each meal is encouraged to control postprandial blood glucose levels.  · Adjust your insulin dosing and food intake as needed if you start a new exercise or sport.  · Follow your sick-day plan at any time you are unable to eat or drink as usual.  · Avoid tobacco and alcohol use.  · Keep all follow-up visits as directed   by your health care provider.  · Follow the advice of your health care provider regarding your prenatal and post-delivery (postpartum) appointments, meal planning, exercise, medicines, vitamins, blood tests, other medical tests, and physical activities.  · Perform daily skin and foot care. Examine your skin and feet daily for cuts, bruises, redness, nail problems, bleeding, blisters, or sores.  · Brush your teeth and gums at least twice a day and floss at least once a day. Follow up with your dentist regularly.  · Schedule an eye exam during the first trimester of your pregnancy or as directed by your health care provider.  · Share your diabetes management plan with your workplace or school.  · Stay up-to-date with immunizations.  · Learn to manage stress.  · Obtain ongoing diabetes education and support as needed.  · Learn about and consider breastfeeding your baby.  · You should have your blood sugar level checked 6-12 weeks after delivery. This is done with an oral glucose tolerance test (OGTT).  SEEK MEDICAL CARE IF:   · You are unable to  eat food or drink fluids for more than 6 hours.  · You have nausea and vomiting for more than 6 hours.  · You have a blood glucose level of 200 mg/dL and you have ketones in your urine.  · There is a change in mental status.  · You develop vision problems.  · You have a persistent headache.  · You have upper abdominal pain or discomfort.  · You develop an additional serious illness.  · You have diarrhea for more than 6 hours.  · You have been sick or have had a fever for a couple of days and are not getting better.  SEEK IMMEDIATE MEDICAL CARE IF:   · You have difficulty breathing.  · You no longer feel the baby moving.  · You are bleeding or have discharge from your vagina.  · You start having premature contractions or labor.  MAKE SURE YOU:  · Understand these instructions.  · Will watch your condition.  · Will get help right away if you are not doing well or get worse.     This information is not intended to replace advice given to you by your health care provider. Make sure you discuss any questions you have with your health care provider.     Document Released: 08/01/2000 Document Revised: 05/16/2014 Document Reviewed: 11/22/2011  Elsevier Interactive Patient Education ©2016 [employer]

## 2015-08-31 ENCOUNTER — Encounter: Payer: Self-pay | Admitting: Obstetrics and Gynecology

## 2015-08-31 ENCOUNTER — Ambulatory Visit (INDEPENDENT_AMBULATORY_CARE_PROVIDER_SITE_OTHER): Payer: Self-pay | Admitting: Obstetrics and Gynecology

## 2015-08-31 VITALS — BP 139/71 | HR 84 | Wt 261.5 lb

## 2015-08-31 DIAGNOSIS — O24419 Gestational diabetes mellitus in pregnancy, unspecified control: Secondary | ICD-10-CM

## 2015-08-31 DIAGNOSIS — O09522 Supervision of elderly multigravida, second trimester: Secondary | ICD-10-CM

## 2015-08-31 DIAGNOSIS — O0992 Supervision of high risk pregnancy, unspecified, second trimester: Secondary | ICD-10-CM

## 2015-08-31 DIAGNOSIS — O99212 Obesity complicating pregnancy, second trimester: Secondary | ICD-10-CM

## 2015-08-31 DIAGNOSIS — O10012 Pre-existing essential hypertension complicating pregnancy, second trimester: Secondary | ICD-10-CM

## 2015-08-31 DIAGNOSIS — E669 Obesity, unspecified: Secondary | ICD-10-CM

## 2015-08-31 LAB — POCT URINALYSIS DIP (DEVICE)
Bilirubin Urine: NEGATIVE
GLUCOSE, UA: NEGATIVE mg/dL
Ketones, ur: NEGATIVE mg/dL
Nitrite: NEGATIVE
PROTEIN: NEGATIVE mg/dL
Specific Gravity, Urine: >=1.03 (ref 1.005–1.030)
UROBILINOGEN UA: 0.2 mg/dL (ref 0.0–1.0)
pH: 5.5 (ref 5.0–8.0)

## 2015-08-31 NOTE — Progress Notes (Signed)
Subjective:  Rita Lamb is a 40 y.o. G3P2002 at [redacted]w[redacted]d being seen today for ongoing prenatal care.  She is currently monitored for the following issues for this high-risk pregnancy and has Asthma, intermittent; Hypertension; Obesity-class 3/Morbid; History of gestational diabetes in prior pregnancy, currently pregnant; Astigmatism of left eye; Impaired glucose tolerance-last A1c 5.3; Hirsutism; Hyperlipidemia LDL goal < 100; Skin growth; Cerumen impaction; Irregular periods; Hypertension in pregnancy, essential, antepartum; Supervision of high risk pregnancy, antepartum; Abnormal laboratory test result; History of abnormal cervical Pap smear; Gestational diabetes mellitus, antepartum; Obesity in pregnancy, antepartum; and Advanced maternal age in multigravida on her problem list.  Patient reports no complaints.  Contractions: Not present. Vag. Bleeding: None.  Movement: Present. Denies leaking of fluid.   The following portions of the patient's history were reviewed and updated as appropriate: allergies, current medications, past family history, past medical history, past social history, past surgical history and problem list. Problem list updated.  Objective:   Filed Vitals:   08/31/15 1010  BP: 139/71  Pulse: 84  Weight: 261 lb 8 oz (118.616 kg)    Fetal Status: Fetal Heart Rate (bpm): 148   Movement: Present     General:  Alert, oriented and cooperative. Patient is in no acute distress.  Skin: Skin is warm and dry. No rash noted.   Cardiovascular: Normal heart rate noted  Respiratory: Normal respiratory effort, no problems with respiration noted  Abdomen: Soft, gravid, appropriate for gestational age. Pain/Pressure: Absent     Pelvic: Vag. Bleeding: None     Cervical exam deferred        Extremities: Normal range of motion.  Edema: None  Mental Status: Normal mood and affect. Normal behavior. Normal judgment and thought content.   Urinalysis: Urine Protein: Trace Urine Glucose:  Negative  Assessment and Plan:  Pregnancy: G3P2002 at [redacted]w[redacted]d  1. Supervision of high risk pregnancy, antepartum, second trimester Anatomy ultrasound reviewed Follow up anatomy scheduled end of May  2. Obesity in pregnancy, antepartum, second trimester   3. Hypertension in pregnancy, essential, antepartum, second trimester Continue current med  4. Gestational diabetes mellitus, antepartum CBGs reviewed- fasting in 120-140  2 hr p Bk 82-140  2hr p L and D majority within range Patient is not consuming a bedtime snack. She often consumes a meal in the middle of the night contributing to her high fasting Diet reviewed with the patient She is aware that if fasting are still elevated we will increase pm glyburide at next visit  5. Advanced maternal age in multigravida, second trimester   General obstetric precautions including but not limited to vaginal bleeding, contractions, leaking of fluid and fetal movement were reviewed in detail with the patient. Please refer to After Visit Summary for other counseling recommendations.  Return in about 1 week (around 09/07/2015).   Mora Bellman, MD

## 2015-09-07 ENCOUNTER — Ambulatory Visit (INDEPENDENT_AMBULATORY_CARE_PROVIDER_SITE_OTHER): Payer: Self-pay | Admitting: Advanced Practice Midwife

## 2015-09-07 VITALS — BP 147/80 | HR 85 | Wt 263.2 lb

## 2015-09-07 DIAGNOSIS — O0992 Supervision of high risk pregnancy, unspecified, second trimester: Secondary | ICD-10-CM

## 2015-09-07 DIAGNOSIS — O24419 Gestational diabetes mellitus in pregnancy, unspecified control: Secondary | ICD-10-CM

## 2015-09-07 LAB — POCT URINALYSIS DIP (DEVICE)
Glucose, UA: NEGATIVE mg/dL
Ketones, ur: NEGATIVE mg/dL
NITRITE: NEGATIVE
Protein, ur: 30 mg/dL — AB
Urobilinogen, UA: 1 mg/dL (ref 0.0–1.0)
pH: 5.5 (ref 5.0–8.0)

## 2015-09-07 NOTE — Progress Notes (Signed)
Urine: trace hgb, small amt wbcs

## 2015-09-07 NOTE — Patient Instructions (Signed)

## 2015-09-07 NOTE — Progress Notes (Signed)
Fasting CBGs: 114-132 (8/8 out of range) PCB:  115-171 (7/8 out of range) PCL:  66-178 (2/7 out of range) PCD: 92-155 (5/7 out of range)  Subjective:  Rita Lamb is a 40 y.o. G3P2002 at [redacted]w[redacted]d being seen today for ongoing prenatal care.  She is currently monitored for the following issues for this high-risk pregnancy and has Asthma, intermittent; Hypertension; Obesity-class 3/Morbid; History of gestational diabetes in prior pregnancy, currently pregnant; Astigmatism of left eye; Impaired glucose tolerance-last A1c 5.3; Hirsutism; Hyperlipidemia LDL goal < 100; Skin growth; Cerumen impaction; Irregular periods; Hypertension in pregnancy, essential, antepartum; Supervision of high risk pregnancy, antepartum; Abnormal laboratory test result; History of abnormal cervical Pap smear; Gestational diabetes mellitus, antepartum; Obesity in pregnancy, antepartum; and Advanced maternal age in multigravida on her problem list.  Patient reports no complaints.  Contractions: Not present. Vag. Bleeding: None.  Movement: Present. Denies leaking of fluid.   The following portions of the patient's history were reviewed and updated as appropriate: allergies, current medications, past family history, past medical history, past social history, past surgical history and problem list. Problem list updated.  Objective:   Filed Vitals:   09/07/15 1023  BP: 147/80  Pulse: 85  Weight: 263 lb 3.2 oz (119.387 kg)    Fetal Status: Fetal Heart Rate (bpm): 138 Fundal Height: 23 cm Movement: Present     General:  Alert, oriented and cooperative. Patient is in no acute distress.  Skin: Skin is warm and dry. No rash noted.   Cardiovascular: Normal heart rate noted  Respiratory: Normal respiratory effort, no problems with respiration noted  Abdomen: Soft, gravid, appropriate for gestational age. Pain/Pressure: Absent     Pelvic: Vag. Bleeding: None     Cervical exam deferred        Extremities: Normal range of  motion.  Edema: None  Mental Status: Normal mood and affect. Normal behavior. Normal judgment and thought content.   Urinalysis: Urine Protein: 1+ Urine Glucose: Negative  Assessment and Plan:  Pregnancy: G3P2002 at [redacted]w[redacted]d 1. Supervision of high risk pregnancy, antepartum, second trimester -   2. A2GDM  - Increase AM Glyburide to 1.5 tabs (3.75 mg)  Preterm labor symptoms and general obstetric precautions including but not limited to vaginal bleeding, contractions, leaking of fluid and fetal movement were reviewed in detail with the patient. Please refer to After Visit Summary for other counseling recommendations.  Return in about 1 week (around 09/14/2015).   Manya Silvas, CNM

## 2015-09-14 ENCOUNTER — Ambulatory Visit (INDEPENDENT_AMBULATORY_CARE_PROVIDER_SITE_OTHER): Payer: BLUE CROSS/BLUE SHIELD | Admitting: Obstetrics & Gynecology

## 2015-09-14 VITALS — BP 107/50 | HR 81 | Wt 263.2 lb

## 2015-09-14 DIAGNOSIS — O09292 Supervision of pregnancy with other poor reproductive or obstetric history, second trimester: Secondary | ICD-10-CM

## 2015-09-14 DIAGNOSIS — Z8632 Personal history of gestational diabetes: Principal | ICD-10-CM

## 2015-09-14 DIAGNOSIS — O99212 Obesity complicating pregnancy, second trimester: Secondary | ICD-10-CM

## 2015-09-14 LAB — POCT URINALYSIS DIP (DEVICE)
Bilirubin Urine: NEGATIVE
GLUCOSE, UA: NEGATIVE mg/dL
HGB URINE DIPSTICK: NEGATIVE
KETONES UR: NEGATIVE mg/dL
Nitrite: NEGATIVE
Protein, ur: NEGATIVE mg/dL
Specific Gravity, Urine: 1.025 (ref 1.005–1.030)
Urobilinogen, UA: 0.2 mg/dL (ref 0.0–1.0)
pH: 5 (ref 5.0–8.0)

## 2015-09-14 MED ORDER — GLYBURIDE 2.5 MG PO TABS: ORAL_TABLET | ORAL | Status: DC

## 2015-09-14 NOTE — Patient Instructions (Signed)

## 2015-09-14 NOTE — Progress Notes (Signed)
Subjective: FBS not in range  Rita Lamb is a 40 y.o. G3P2002 at [redacted]w[redacted]d being seen today for ongoing prenatal care.  She is currently monitored for the following issues for this high-risk pregnancy and has Asthma, intermittent; Hypertension; Obesity-class 3/Morbid; History of gestational diabetes in prior pregnancy, currently pregnant; Astigmatism of left eye; Impaired glucose tolerance-last A1c 5.3; Hirsutism; Hyperlipidemia LDL goal < 100; Skin growth; Cerumen impaction; Irregular periods; Hypertension in pregnancy, essential, antepartum; Supervision of high risk pregnancy, antepartum; Abnormal laboratory test result; History of abnormal cervical Pap smear; Gestational diabetes mellitus, antepartum; Obesity in pregnancy, antepartum; and Advanced maternal age in multigravida on her problem list.  Patient reports no complaints.  Contractions: Not present. Vag. Bleeding: None.  Movement: Present. Denies leaking of fluid.   The following portions of the patient's history were reviewed and updated as appropriate: allergies, current medications, past family history, past medical history, past social history, past surgical history and problem list. Problem list updated.  Objective:   Filed Vitals:   09/14/15 1137  BP: 107/50  Pulse: 81  Weight: 263 lb 3.2 oz (119.387 kg)    Fetal Status: Fetal Heart Rate (bpm): 144   Movement: Present     General:  Alert, oriented and cooperative. Patient is in no acute distress.  Skin: Skin is warm and dry. No rash noted.   Cardiovascular: Normal heart rate noted  Respiratory: Normal respiratory effort, no problems with respiration noted  Abdomen: Soft, gravid, appropriate for gestational age. Pain/Pressure: Absent     Pelvic: Vag. Bleeding: None     Cervical exam deferred        Extremities: Normal range of motion.  Edema: None  Mental Status: Normal mood and affect. Normal behavior. Normal judgment and thought content.   Urinalysis: Urine Protein:  Negative Urine Glucose: Negative  Assessment and Plan:  Pregnancy: G3P2002 at [redacted]w[redacted]d  1. History of gestational diabetes in prior pregnancy, currently pregnant, second trimester FBS 109-139, PP most in range occas 152-167 - glyBURIDE (DIABETA) 2.5 MG tablet; One by mouth in the morning and 2 after supper  Dispense: 90 tablet; Refill: 3  Preterm labor symptoms and general obstetric precautions including but not limited to vaginal bleeding, contractions, leaking of fluid and fetal movement were reviewed in detail with the patient. Please refer to After Visit Summary for other counseling recommendations.  Return in about 2 weeks (around 09/28/2015). Fetal echo. tomorrow  Woodroe Mode, MD

## 2015-09-21 ENCOUNTER — Telehealth: Payer: Self-pay | Admitting: Obstetrics and Gynecology

## 2015-09-21 NOTE — Telephone Encounter (Signed)
Joanette Gula from [facility] call and said she can be given a call if she can be of any Help to the patient    [phone] X [phone].

## 2015-09-28 ENCOUNTER — Ambulatory Visit (INDEPENDENT_AMBULATORY_CARE_PROVIDER_SITE_OTHER): Payer: BLUE CROSS/BLUE SHIELD | Admitting: Obstetrics and Gynecology

## 2015-09-28 VITALS — BP 118/57 | HR 82 | Wt 260.2 lb

## 2015-09-28 DIAGNOSIS — O10012 Pre-existing essential hypertension complicating pregnancy, second trimester: Secondary | ICD-10-CM

## 2015-09-28 DIAGNOSIS — Z8632 Personal history of gestational diabetes: Principal | ICD-10-CM

## 2015-09-28 DIAGNOSIS — O09292 Supervision of pregnancy with other poor reproductive or obstetric history, second trimester: Secondary | ICD-10-CM | POA: Diagnosis not present

## 2015-09-28 DIAGNOSIS — O0992 Supervision of high risk pregnancy, unspecified, second trimester: Secondary | ICD-10-CM

## 2015-09-28 DIAGNOSIS — O24419 Gestational diabetes mellitus in pregnancy, unspecified control: Secondary | ICD-10-CM

## 2015-09-28 LAB — POCT URINALYSIS DIP (DEVICE)
Bilirubin Urine: NEGATIVE
GLUCOSE, UA: NEGATIVE mg/dL
Hgb urine dipstick: NEGATIVE
KETONES UR: NEGATIVE mg/dL
Nitrite: NEGATIVE
PH: 5 (ref 5.0–8.0)
PROTEIN: NEGATIVE mg/dL
Specific Gravity, Urine: <=1.005 (ref 1.005–1.030)
Urobilinogen, UA: 0.2 mg/dL (ref 0.0–1.0)

## 2015-09-28 MED ORDER — LABETALOL HCL 200 MG PO TABS: 400.0000 mg | ORAL_TABLET | Freq: Two times a day (BID) | ORAL | Status: DC

## 2015-09-28 MED ORDER — GLYBURIDE 2.5 MG PO TABS: ORAL_TABLET | ORAL | Status: DC

## 2015-09-28 NOTE — Progress Notes (Signed)
Subjective:  Rita Lamb is a 40 y.o. G3P2002 at [redacted]w[redacted]d being seen today for ongoing prenatal care.  She is currently monitored for the following issues for this high-risk pregnancy and has Asthma, intermittent; Hypertension; Obesity-class 3/Morbid; History of gestational diabetes in prior pregnancy, currently pregnant; Astigmatism of left eye; Impaired glucose tolerance-last A1c 5.3; Hirsutism; Hyperlipidemia LDL goal < 100; Skin growth; Cerumen impaction; Irregular periods; Hypertension in pregnancy, essential, antepartum; Supervision of high risk pregnancy, antepartum; Abnormal laboratory test result; History of abnormal cervical Pap smear; Gestational diabetes mellitus, antepartum; Obesity in pregnancy, antepartum; and Advanced maternal age in multigravida on her problem list.  Patient reports no complaints.  Contractions: Not present. Vag. Bleeding: None.  Movement: Present. Denies leaking of fluid.   fastings 100s. PPs less than 120  The following portions of the patient's history were reviewed and updated as appropriate: allergies, current medications, past family history, past medical history, past social history, past surgical history and problem list. Problem list updated.  Objective:   Filed Vitals:   09/28/15 0830  BP: 118/57  Pulse: 82  Weight: 260 lb 3.2 oz (118.026 kg)    Fetal Status: Fetal Heart Rate (bpm): 154   Movement: Present     General:  Alert, oriented and cooperative. Patient is in no acute distress.  Skin: Skin is warm and dry. No rash noted.   Cardiovascular: Normal heart rate noted  Respiratory: Normal respiratory effort, no problems with respiration noted  Abdomen: Soft, gravid, appropriate for gestational age. Pain/Pressure: Present     Pelvic: Vag. Bleeding: None     Cervical exam deferred        Extremities: Normal range of motion.  Edema: Trace  Mental Status: Normal mood and affect. Normal behavior. Normal judgment and thought content.    Urinalysis:      Assessment and Plan:  Pregnancy: G3P2002 at [redacted]w[redacted]d  # A2GDM - continue 2.5 in the morning, increase evening dose to 6.25, with instructions to further increase to 7.5 for fasting > 95 - growth u/s scheduled 5/30  # chtn - stop hctz; increase labetalol to 400 bid - nurse bp visit one week There are no diagnoses linked to this encounter. Preterm labor symptoms and general obstetric precautions including but not limited to vaginal bleeding, contractions, leaking of fluid and fetal movement were reviewed in detail with the patient. Please refer to After Visit Summary for other counseling recommendations.  No Follow-up on file.   Gwynne Edinger, MD

## 2015-09-28 NOTE — Progress Notes (Signed)
Breastfeeding tip reviewed 

## 2015-10-06 ENCOUNTER — Ambulatory Visit (HOSPITAL_COMMUNITY)
Admission: RE | Admit: 2015-10-06 | Discharge: 2015-10-06 | Disposition: A | Discharge status: Home / Self Care | Payer: BLUE CROSS/BLUE SHIELD | Source: Ambulatory Visit | Attending: Obstetrics & Gynecology | Admitting: Obstetrics & Gynecology

## 2015-10-06 ENCOUNTER — Other Ambulatory Visit (HOSPITAL_COMMUNITY): Payer: Self-pay | Admitting: Maternal and Fetal Medicine

## 2015-10-06 ENCOUNTER — Encounter (HOSPITAL_COMMUNITY): Payer: Self-pay

## 2015-10-06 ENCOUNTER — Ambulatory Visit: Payer: BLUE CROSS/BLUE SHIELD | Admitting: General Practice

## 2015-10-06 VITALS — BP 123/66 | HR 79 | Ht 63.0 in | Wt 270.0 lb

## 2015-10-06 VITALS — BP 127/76 | HR 82 | Wt 270.4 lb

## 2015-10-06 DIAGNOSIS — IMO0002 Reserved for concepts with insufficient information to code with codable children: Secondary | ICD-10-CM

## 2015-10-06 DIAGNOSIS — R899 Unspecified abnormal finding in specimens from other organs, systems and tissues: Secondary | ICD-10-CM

## 2015-10-06 DIAGNOSIS — Z3A26 26 weeks gestation of pregnancy: Secondary | ICD-10-CM

## 2015-10-06 DIAGNOSIS — O0992 Supervision of high risk pregnancy, unspecified, second trimester: Secondary | ICD-10-CM

## 2015-10-06 DIAGNOSIS — O10912 Unspecified pre-existing hypertension complicating pregnancy, second trimester: Secondary | ICD-10-CM

## 2015-10-06 DIAGNOSIS — O24419 Gestational diabetes mellitus in pregnancy, unspecified control: Secondary | ICD-10-CM

## 2015-10-06 DIAGNOSIS — Z0489 Encounter for examination and observation for other specified reasons: Secondary | ICD-10-CM

## 2015-10-06 DIAGNOSIS — O10919 Unspecified pre-existing hypertension complicating pregnancy, unspecified trimester: Secondary | ICD-10-CM

## 2015-10-06 DIAGNOSIS — Z0130 Encounter for examination of blood pressure without abnormal findings: Secondary | ICD-10-CM

## 2015-10-06 DIAGNOSIS — O24415 Gestational diabetes mellitus in pregnancy, controlled by oral hypoglycemic drugs: Secondary | ICD-10-CM

## 2015-10-06 DIAGNOSIS — O10012 Pre-existing essential hypertension complicating pregnancy, second trimester: Secondary | ICD-10-CM

## 2015-10-06 DIAGNOSIS — Z8742 Personal history of other diseases of the female genital tract: Secondary | ICD-10-CM

## 2015-10-06 DIAGNOSIS — O09522 Supervision of elderly multigravida, second trimester: Secondary | ICD-10-CM

## 2015-10-06 NOTE — Progress Notes (Signed)
Patient here for BP check today after change in medication. Reviewed BP with Dr Ernestina Patches who was fine with scheduled f/u on 6/5

## 2015-10-09 ENCOUNTER — Encounter (HOSPITAL_COMMUNITY): Payer: Self-pay

## 2015-10-12 ENCOUNTER — Ambulatory Visit (INDEPENDENT_AMBULATORY_CARE_PROVIDER_SITE_OTHER): Payer: BLUE CROSS/BLUE SHIELD | Admitting: Family Medicine

## 2015-10-12 VITALS — BP 137/72 | HR 79 | Temp 98.7°F | Wt 268.5 lb

## 2015-10-12 DIAGNOSIS — Z23 Encounter for immunization: Secondary | ICD-10-CM

## 2015-10-12 DIAGNOSIS — O0992 Supervision of high risk pregnancy, unspecified, second trimester: Secondary | ICD-10-CM

## 2015-10-12 DIAGNOSIS — O24419 Gestational diabetes mellitus in pregnancy, unspecified control: Secondary | ICD-10-CM

## 2015-10-12 DIAGNOSIS — O10012 Pre-existing essential hypertension complicating pregnancy, second trimester: Secondary | ICD-10-CM

## 2015-10-12 LAB — POCT URINALYSIS DIP (DEVICE)
Glucose, UA: NEGATIVE mg/dL
Hgb urine dipstick: NEGATIVE
Ketones, ur: NEGATIVE mg/dL
NITRITE: NEGATIVE
Protein, ur: NEGATIVE mg/dL
Specific Gravity, Urine: 1.025 (ref 1.005–1.030)
Urobilinogen, UA: 0.2 mg/dL (ref 0.0–1.0)
pH: 5.5 (ref 5.0–8.0)

## 2015-10-12 LAB — CBC
HCT: 31 % — ABNORMAL LOW (ref 35.0–45.0)
HEMOGLOBIN: 9.9 g/dL — AB (ref 11.7–15.5)
MCH: 28 pg (ref 27.0–33.0)
MCHC: 31.9 g/dL — ABNORMAL LOW (ref 32.0–36.0)
MCV: 87.8 fL (ref 80.0–100.0)
MPV: 10.2 fL (ref 7.5–12.5)
Platelets: 295 10*3/uL (ref 140–400)
RBC: 3.53 MIL/uL — AB (ref 3.80–5.10)
RDW: 15.1 % — ABNORMAL HIGH (ref 11.0–15.0)
WBC: 13.1 10*3/uL — AB (ref 3.8–10.8)

## 2015-10-12 LAB — HIV ANTIBODY (ROUTINE TESTING W REFLEX): HIV: NONREACTIVE

## 2015-10-12 MED ORDER — TETANUS-DIPHTH-ACELL PERTUSSIS 5-2.5-18.5 LF-MCG/0.5 IM SUSP
0.5000 mL | Freq: Once | INTRAMUSCULAR | Status: AC
  Administered 2015-10-12: 0.5 mL via INTRAMUSCULAR

## 2015-10-12 MED ORDER — LABETALOL HCL 200 MG PO TABS: 200.0000 mg | ORAL_TABLET | Freq: Two times a day (BID) | ORAL | Status: DC

## 2015-10-12 MED ORDER — HYDROCHLOROTHIAZIDE 25 MG PO TABS: 25.0000 mg | ORAL_TABLET | Freq: Every day | ORAL | Status: DC

## 2015-10-12 NOTE — Progress Notes (Signed)
C/o headaches daily=6.5 - states not taking anything for headaches. Headache goes away with sleep- states she thinks it is because meds changes- states is a "fuzzy headache".

## 2015-10-12 NOTE — Patient Instructions (Signed)
Gestational Diabetes Mellitus Gestational diabetes mellitus, often simply referred to as gestational diabetes, is a type of diabetes that some women develop during pregnancy. In gestational diabetes, the pancreas does not make enough insulin (a hormone), the cells are less responsive to the insulin that is made (insulin resistance), or both.Normally, insulin moves sugars from food into the tissue cells. The tissue cells use the sugars for energy. The lack of insulin or the lack of normal response to insulin causes excess sugars to build up in the blood instead of going into the tissue cells. As a result, high blood sugar (hyperglycemia) develops. The effect of high sugar (glucose) levels can cause many problems.  RISK FACTORS You have an increased chance of developing gestational diabetes if you have a family history of diabetes and also have one or more of the following risk factors:  A body mass index over 30 (obesity).  A previous pregnancy with gestational diabetes.  An older age at the time of pregnancy. If blood glucose levels are kept in the normal range during pregnancy, women can have a healthy pregnancy. If your blood glucose levels are not well controlled, there may be risks to you, your unborn baby (fetus), your labor and delivery, or your newborn baby.  SYMPTOMS  If symptoms are experienced, they are much like symptoms you would normally expect during pregnancy. The symptoms of gestational diabetes include:   Increased thirst (polydipsia).  Increased urination (polyuria).  Increased urination during the night (nocturia).  Weight loss. This weight loss may be rapid.  Frequent, recurring infections.  Tiredness (fatigue).  Weakness.  Vision changes, such as blurred vision.  Fruity smell to your breath.  Abdominal pain. DIAGNOSIS Diabetes is diagnosed when blood glucose levels are increased. Your blood glucose level may be checked by one or more of the following blood  tests:  A fasting blood glucose test. You will not be allowed to eat for at least 8 hours before a blood sample is taken.  A random blood glucose test. Your blood glucose is checked at any time of the day regardless of when you ate.  An oral glucose tolerance test (OGTT). Your blood glucose is measured after you have not eaten (fasted) for 1-3 hours and then after you drink a glucose-containing beverage. Since the hormones that cause insulin resistance are highest at about 24-28 weeks of a pregnancy, an OGTT is usually performed during that time. If you have risk factors, you may be screened for undiagnosed type 2 diabetes at your first prenatal visit. TREATMENT  Gestational diabetes should be managed first with diet and exercise. Medicines may be added only if they are needed.  You will need to take diabetes medicine or insulin daily to keep blood glucose levels in the desired range.  You will need to match insulin dosing with exercise and healthy food choices. If you have gestational diabetes, your treatment goal is to maintain the following blood glucose levels:  Before meals (preprandial): at or below 95 mg/dL.  After meals (postprandial):  One hour after a meal: at or below 140 mg/dL.  Two hours after a meal: at or below 120 mg/dL. If you have pre-existing type 1 or type 2 diabetes, your treatment goal is to maintain the following blood glucose levels:  Before meals, at bedtime, and overnight: 60-99 mg/dL.  After meals: peak of 100-129 mg/dL. HOME CARE INSTRUCTIONS   Have your hemoglobin A1c level checked twice a year.  Perform daily blood glucose monitoring as directed by   your health care provider. It is common to perform frequent blood glucose monitoring.  Monitor urine ketones when you are ill and as directed by your health care provider.  Take your diabetes medicine and insulin as directed by your health care provider to maintain your blood glucose level in the desired  range.  Never run out of diabetes medicine or insulin. It is needed every day.  Adjust insulin based on your intake of carbohydrates. Carbohydrates can raise blood glucose levels but need to be included in your diet. Carbohydrates provide vitamins, minerals, and fiber which are an essential part of a healthy diet. Carbohydrates are found in fruits, vegetables, whole grains, dairy products, legumes, and foods containing added sugars.  Eat healthy foods. Alternate 3 meals with 3 snacks.  Maintain a healthy weight gain. The usual total expected weight gain varies according to your prepregnancy body mass index (BMI).  Carry a medical alert card or wear your medical alert jewelry.  Carry a 15-gram carbohydrate snack with you at all times to treat low blood glucose (hypoglycemia). Some examples of 15-gram carbohydrate snacks include:  Glucose tablets, 3 or 4.  Glucose gel, 15-gram tube.  Raisins, 2 tablespoons (24 g).  Jelly beans, 6.  Animal crackers, 8.  Fruit juice, regular soda, or low-fat milk, 4 ounces (120 mL).  Gummy treats, 9.  Recognize hypoglycemia. Hypoglycemia during pregnancy occurs with blood glucose levels of 60 mg/dL and below. The risk for hypoglycemia increases when fasting or skipping meals, during or after intense exercise, and during sleep. Hypoglycemia symptoms can include:  Tremors or shakes.  Decreased ability to concentrate.  Sweating.  Increased heart rate.  Headache.  Dry mouth.  Hunger.  Irritability.  Anxiety.  Restless sleep.  Altered speech or coordination.  Confusion.  Treat hypoglycemia promptly. If you are alert and able to safely swallow, follow the 15:15 rule:  Take 15-20 grams of rapid-acting glucose or carbohydrate. Rapid-acting options include glucose gel, glucose tablets, or 4 ounces (120 mL) of fruit juice, regular soda, or low-fat milk.  Check your blood glucose level 15 minutes after taking the glucose.  Take 15-20  grams more of glucose if the repeat blood glucose level is still 70 mg/dL or below.  Eat a meal or snack within 1 hour once blood glucose levels return to normal.  Be alert to polyuria (excess urination) and polydipsia (excess thirst) which are early signs of hyperglycemia. An early awareness of hyperglycemia allows for prompt treatment. Treat hyperglycemia as directed by your health care provider.  Engage in at least 30 minutes of physical activity a day or as directed by your health care provider. Ten minutes of physical activity timed 30 minutes after each meal is encouraged to control postprandial blood glucose levels.  Adjust your insulin dosing and food intake as needed if you start a new exercise or sport.  Follow your sick-day plan at any time you are unable to eat or drink as usual.  Avoid tobacco and alcohol use.  Keep all follow-up visits as directed by your health care provider.  Follow the advice of your health care provider regarding your prenatal and post-delivery (postpartum) appointments, meal planning, exercise, medicines, vitamins, blood tests, other medical tests, and physical activities.  Perform daily skin and foot care. Examine your skin and feet daily for cuts, bruises, redness, nail problems, bleeding, blisters, or sores.  Brush your teeth and gums at least twice a day and floss at least once a day. Follow up with your dentist   regularly.  Schedule an eye exam during the first trimester of your pregnancy or as directed by your health care provider.  Share your diabetes management plan with your workplace or school.  Stay up-to-date with immunizations.  Learn to manage stress.  Obtain ongoing diabetes education and support as needed.  Learn about and consider breastfeeding your baby.  You should have your blood sugar level checked 6-12 weeks after delivery. This is done with an oral glucose tolerance test (OGTT). SEEK MEDICAL CARE IF:   You are unable to  eat food or drink fluids for more than 6 hours.  You have nausea and vomiting for more than 6 hours.  You have a blood glucose level of 200 mg/dL and you have ketones in your urine.  There is a change in mental status.  You develop vision problems.  You have a persistent headache.  You have upper abdominal pain or discomfort.  You develop an additional serious illness.  You have diarrhea for more than 6 hours.  You have been sick or have had a fever for a couple of days and are not getting better. SEEK IMMEDIATE MEDICAL CARE IF:   You have difficulty breathing.  You no longer feel the baby moving.  You are bleeding or have discharge from your vagina.  You start having premature contractions or labor. MAKE SURE YOU:  Understand these instructions.  Will watch your condition.  Will get help right away if you are not doing well or get worse.   This information is not intended to replace advice given to you by your health care provider. Make sure you discuss any questions you have with your health care provider.   Document Released: 08/01/2000 Document Revised: 05/16/2014 Document Reviewed: 11/22/2011 [employer] Interactive Patient Education 2016 [employer]  Breastfeeding Deciding to breastfeed is one of the best choices you can make for you and your baby. A change in hormones during pregnancy causes your breast tissue to grow and increases the number and size of your milk ducts. These hormones also allow proteins, sugars, and fats from your blood supply to make breast milk in your milk-producing glands. Hormones prevent breast milk from being released before your baby is born as well as prompt milk flow after birth. Once breastfeeding has begun, thoughts of your baby, as well as his or her sucking or crying, can stimulate the release of milk from your milk-producing glands.  BENEFITS OF BREASTFEEDING For Your Baby  Your first milk (colostrum) helps your baby's digestive  system function better.  There are antibodies in your milk that help your baby fight off infections.  Your baby has a lower incidence of asthma, allergies, and sudden infant death syndrome.  The nutrients in breast milk are better for your baby than infant formulas and are designed uniquely for your baby's needs.  Breast milk improves your baby's brain development.  Your baby is less likely to develop other conditions, such as childhood obesity, asthma, or type 2 diabetes mellitus. For You  Breastfeeding helps to create a very special bond between you and your baby.  Breastfeeding is convenient. Breast milk is always available at the correct temperature and costs nothing.  Breastfeeding helps to burn calories and helps you lose the weight gained during pregnancy.  Breastfeeding makes your uterus contract to its prepregnancy size faster and slows bleeding (lochia) after you give birth.   Breastfeeding helps to lower your risk of developing type 2 diabetes mellitus, osteoporosis, and breast or ovarian cancer   later in life. SIGNS THAT YOUR BABY IS HUNGRY Early Signs of Hunger  Increased alertness or activity.  Stretching.  Movement of the head from side to side.  Movement of the head and opening of the mouth when the corner of the mouth or cheek is stroked (rooting).  Increased sucking sounds, smacking lips, cooing, sighing, or squeaking.  Hand-to-mouth movements.  Increased sucking of fingers or hands. Late Signs of Hunger  Fussing.  Intermittent crying. Extreme Signs of Hunger Signs of extreme hunger will require calming and consoling before your baby will be able to breastfeed successfully. Do not wait for the following signs of extreme hunger to occur before you initiate breastfeeding:  Restlessness.  A loud, strong cry.  Screaming. BREASTFEEDING BASICS Breastfeeding Initiation  Find a comfortable place to sit or lie down, with your neck and back well  supported.  Place a pillow or rolled up blanket under your baby to bring him or her to the level of your breast (if you are seated). Nursing pillows are specially designed to help support your arms and your baby while you breastfeed.  Make sure that your baby's abdomen is facing your abdomen.  Gently massage your breast. With your fingertips, massage from your chest wall toward your nipple in a circular motion. This encourages milk flow. You may need to continue this action during the feeding if your milk flows slowly.  Support your breast with 4 fingers underneath and your thumb above your nipple. Make sure your fingers are well away from your nipple and your baby's mouth.  Stroke your baby's lips gently with your finger or nipple.  When your baby's mouth is open wide enough, quickly bring your baby to your breast, placing your entire nipple and as much of the colored area around your nipple (areola) as possible into your baby's mouth.  More areola should be visible above your baby's upper lip than below the lower lip.  Your baby's tongue should be between his or her lower gum and your breast.  Ensure that your baby's mouth is correctly positioned around your nipple (latched). Your baby's lips should create a seal on your breast and be turned out (everted).  It is common for your baby to suck about 2-3 minutes in order to start the flow of breast milk. Latching Teaching your baby how to latch on to your breast properly is very important. An improper latch can cause nipple pain and decreased milk supply for you and poor weight gain in your baby. Also, if your baby is not latched onto your nipple properly, he or she may swallow some air during feeding. This can make your baby fussy. Burping your baby when you switch breasts during the feeding can help to get rid of the air. However, teaching your baby to latch on properly is still the best way to prevent fussiness from swallowing air while  breastfeeding. Signs that your baby has successfully latched on to your nipple:  Silent tugging or silent sucking, without causing you pain.  Swallowing heard between every 3-4 sucks.  Muscle movement above and in front of his or her ears while sucking. Signs that your baby has not successfully latched on to nipple:  Sucking sounds or smacking sounds from your baby while breastfeeding.  Nipple pain. If you think your baby has not latched on correctly, slip your finger into the corner of your baby's mouth to break the suction and place it between your baby's gums. Attempt breastfeeding initiation again. Signs   of Successful Breastfeeding Signs from your baby:  A gradual decrease in the number of sucks or complete cessation of sucking.  Falling asleep.  Relaxation of his or her body.  Retention of a small amount of milk in his or her mouth.  Letting go of your breast by himself or herself. Signs from you:  Breasts that have increased in firmness, weight, and size 1-3 hours after feeding.  Breasts that are softer immediately after breastfeeding.  Increased milk volume, as well as a change in milk consistency and color by the fifth day of breastfeeding.  Nipples that are not sore, cracked, or bleeding. Signs That Your Baby is Getting Enough Milk  Wetting at least 3 diapers in a 24-hour period. The urine should be clear and pale yellow by age 5 days.  At least 3 stools in a 24-hour period by age 5 days. The stool should be soft and yellow.  At least 3 stools in a 24-hour period by age 7 days. The stool should be seedy and yellow.  No loss of weight greater than 10% of birth weight during the first 3 days of age.  Average weight gain of 4-7 ounces (113-198 g) per week after age 4 days.  Consistent daily weight gain by age 5 days, without weight loss after the age of 2 weeks. After a feeding, your baby may spit up a small amount. This is common. BREASTFEEDING FREQUENCY AND  DURATION Frequent feeding will help you make more milk and can prevent sore nipples and breast engorgement. Breastfeed when you feel the need to reduce the fullness of your breasts or when your baby shows signs of hunger. This is called "breastfeeding on demand." Avoid introducing a pacifier to your baby while you are working to establish breastfeeding (the first 4-6 weeks after your baby is born). After this time you may choose to use a pacifier. Research has shown that pacifier use during the first year of a baby's life decreases the risk of sudden infant death syndrome (SIDS). Allow your baby to feed on each breast as long as he or she wants. Breastfeed until your baby is finished feeding. When your baby unlatches or falls asleep while feeding from the first breast, offer the second breast. Because newborns are often sleepy in the first few weeks of life, you may need to awaken your baby to get him or her to feed. Breastfeeding times will vary from baby to baby. However, the following rules can serve as a guide to help you ensure that your baby is properly fed:  Newborns (babies 4 weeks of age or younger) may breastfeed every 1-3 hours.  Newborns should not go longer than 3 hours during the day or 5 hours during the night without breastfeeding.  You should breastfeed your baby a minimum of 8 times in a 24-hour period until you begin to introduce solid foods to your baby at around 6 months of age. BREAST MILK PUMPING Pumping and storing breast milk allows you to ensure that your baby is exclusively fed your breast milk, even at times when you are unable to breastfeed. This is especially important if you are going back to work while you are still breastfeeding or when you are not able to be present during feedings. Your lactation consultant can give you guidelines on how long it is safe to store breast milk. A breast pump is a machine that allows you to pump milk from your breast into a sterile bottle.  The pumped   breast milk can then be stored in a refrigerator or freezer. Some breast pumps are operated by hand, while others use electricity. Ask your lactation consultant which type will work best for you. Breast pumps can be purchased, but some hospitals and breastfeeding support groups lease breast pumps on a monthly basis. A lactation consultant can teach you how to hand express breast milk, if you prefer not to use a pump. CARING FOR YOUR BREASTS WHILE YOU BREASTFEED Nipples can become dry, cracked, and sore while breastfeeding. The following recommendations can help keep your breasts moisturized and healthy:  Avoid using soap on your nipples.  Wear a supportive bra. Although not required, special nursing bras and tank tops are designed to allow access to your breasts for breastfeeding without taking off your entire bra or top. Avoid wearing underwire-style bras or extremely tight bras.  Air dry your nipples for 3-4minutes after each feeding.  Use only cotton bra pads to absorb leaked breast milk. Leaking of breast milk between feedings is normal.  Use lanolin on your nipples after breastfeeding. Lanolin helps to maintain your skin's normal moisture barrier. If you use pure lanolin, you do not need to wash it off before feeding your baby again. Pure lanolin is not toxic to your baby. You may also hand express a few drops of breast milk and gently massage that milk into your nipples and allow the milk to air dry. In the first few weeks after giving birth, some women experience extremely full breasts (engorgement). Engorgement can make your breasts feel heavy, warm, and tender to the touch. Engorgement peaks within 3-5 days after you give birth. The following recommendations can help ease engorgement:  Completely empty your breasts while breastfeeding or pumping. You may want to start by applying warm, moist heat (in the shower or with warm water-soaked hand towels) just before feeding or pumping.  This increases circulation and helps the milk flow. If your baby does not completely empty your breasts while breastfeeding, pump any extra milk after he or she is finished.  Wear a snug bra (nursing or regular) or tank top for 1-2 days to signal your body to slightly decrease milk production.  Apply ice packs to your breasts, unless this is too uncomfortable for you.  Make sure that your baby is latched on and positioned properly while breastfeeding. If engorgement persists after 48 hours of following these recommendations, contact your health care provider or a lactation consultant. OVERALL HEALTH CARE RECOMMENDATIONS WHILE BREASTFEEDING  Eat healthy foods. Alternate between meals and snacks, eating 3 of each per day. Because what you eat affects your breast milk, some of the foods may make your baby more irritable than usual. Avoid eating these foods if you are sure that they are negatively affecting your baby.  Drink milk, fruit juice, and water to satisfy your thirst (about 10 glasses a day).  Rest often, relax, and continue to take your prenatal vitamins to prevent fatigue, stress, and anemia.  Continue breast self-awareness checks.  Avoid chewing and smoking tobacco. Chemicals from cigarettes that pass into breast milk and exposure to secondhand smoke may harm your baby.  Avoid alcohol and drug use, including marijuana. Some medicines that may be harmful to your baby can pass through breast milk. It is important to ask your health care provider before taking any medicine, including all over-the-counter and prescription medicine as well as vitamin and herbal supplements. It is possible to become pregnant while breastfeeding. If birth control is desired, ask   your health care provider about options that will be safe for your baby. SEEK MEDICAL CARE IF:  You feel like you want to stop breastfeeding or have become frustrated with breastfeeding.  You have painful breasts or  nipples.  Your nipples are cracked or bleeding.  Your breasts are red, tender, or warm.  You have a swollen area on either breast.  You have a fever or chills.  You have nausea or vomiting.  You have drainage other than breast milk from your nipples.  Your breasts do not become full before feedings by the fifth day after you give birth.  You feel sad and depressed.  Your baby is too sleepy to eat well.  Your baby is having trouble sleeping.   Your baby is wetting less than 3 diapers in a 24-hour period.  Your baby has less than 3 stools in a 24-hour period.  Your baby's skin or the white part of his or her eyes becomes yellow.   Your baby is not gaining weight by 5 days of age. SEEK IMMEDIATE MEDICAL CARE IF:  Your baby is overly tired (lethargic) and does not want to wake up and feed.  Your baby develops an unexplained fever.   This information is not intended to replace advice given to you by your health care provider. Make sure you discuss any questions you have with your health care provider.   Document Released: 04/25/2005 Document Revised: 01/14/2015 Document Reviewed: 10/17/2012 [employer] Interactive Patient Education 2016 [employer]  

## 2015-10-12 NOTE — Progress Notes (Signed)
Subjective:  Rita Lamb is a 40 y.o. G3P2002 at [redacted]w[redacted]d being seen today for ongoing prenatal care.  She is currently monitored for the following issues for this high-risk pregnancy and has Asthma, intermittent; Hypertension; Obesity-class 3/Morbid; History of gestational diabetes in prior pregnancy, currently pregnant; Astigmatism of left eye; Impaired glucose tolerance-last A1c 5.3; Hirsutism; Hyperlipidemia LDL goal < 100; Irregular periods; Hypertension in pregnancy, essential, antepartum; Supervision of high risk pregnancy, antepartum; Abnormal laboratory test result; History of abnormal cervical Pap smear; Gestational diabetes mellitus, antepartum; Obesity in pregnancy, antepartum; and Advanced maternal age in multigravida on her problem list.  Patient reports no complaints.  Contractions: Not present. Vag. Bleeding: None.  Movement: Present. Denies leaking of fluid.   The following portions of the patient's history were reviewed and updated as appropriate: allergies, current medications, past family history, past medical history, past social history, past surgical history and problem list. Problem list updated.  Objective:   Filed Vitals:   10/12/15 0809 10/12/15 0818  BP: 144/70 137/72  Pulse: 79   Temp: 98.7 F (37.1 C)   Weight: 268 lb 8 oz (121.791 kg)     Fetal Status: Fetal Heart Rate (bpm): 147   Movement: Present     General:  Alert, oriented and cooperative. Patient is in no acute distress.  Skin: Skin is warm and dry. No rash noted.   Cardiovascular: Normal heart rate noted  Respiratory: Normal respiratory effort, no problems with respiration noted  Abdomen: Soft, gravid, appropriate for gestational age. Pain/Pressure: Absent     Pelvic: Vag. Bleeding: None     Cervical exam deferred        Extremities: Normal range of motion.  Edema: Mild pitting, slight indentation  Mental Status: Normal mood and affect. Normal behavior. Normal judgment and thought content.    Urinalysis: Urine Protein: Negative Urine Glucose: Negative FBS on 7.5 mg at hs 86-101 (2 out of range) 2 hr pp 82-140 (2 out of range) Assessment and Plan:  Pregnancy: G3P2002 at [redacted]w[redacted]d  1. Supervision of high risk pregnancy, antepartum, second trimester 28 wk labs today - CBC - RPR - HIV antibody (with reflex) - Tdap (BOOSTRIX) injection 0.5 mL; Inject 0.5 mLs into the muscle once.  2. Gestational diabetes mellitus, antepartum BS controlled with latest increase in Glyburide--continue this Latest growth at 56%  3. Hypertension in pregnancy, essential, antepartum, second trimester Complains of swelling and headache with recent medication changes--will go back to 200 mg Labetalol BID and HCTZ 25 mg daily--since she has been on this with last 2 pregnancies and the entirety of this one - labetalol (NORMODYNE) 200 MG tablet; Take 1 tablet (200 mg total) by mouth 2 (two) times daily.  Dispense: 120 tablet; Refill: 3 - hydrochlorothiazide (HYDRODIURIL) 25 MG tablet; Take 1 tablet (25 mg total) by mouth daily.  Dispense: 30 tablet; Refill: 3  Preterm labor symptoms and general obstetric precautions including but not limited to vaginal bleeding, contractions, leaking of fluid and fetal movement were reviewed in detail with the patient. Please refer to After Visit Summary for other counseling recommendations.  Return in 2 weeks (on 10/26/2015).   Donnamae Jude, MD

## 2015-10-13 ENCOUNTER — Encounter: Payer: Self-pay | Admitting: Family Medicine

## 2015-10-13 LAB — RPR: RPR: REACTIVE — AB

## 2015-10-13 LAB — RPR TITER: RPR Titer: 1:2 {titer}

## 2015-10-13 LAB — FLUORESCENT TREPONEMAL AB(FTA)-IGG-BLD: FLUORESCENT TREPONEMAL ABS: NONREACTIVE

## 2015-10-26 ENCOUNTER — Ambulatory Visit (INDEPENDENT_AMBULATORY_CARE_PROVIDER_SITE_OTHER): Payer: BLUE CROSS/BLUE SHIELD | Admitting: Obstetrics & Gynecology

## 2015-10-26 ENCOUNTER — Other Ambulatory Visit: Payer: Self-pay | Admitting: Family

## 2015-10-26 VITALS — BP 138/84 | HR 78 | Wt 260.9 lb

## 2015-10-26 DIAGNOSIS — O0993 Supervision of high risk pregnancy, unspecified, third trimester: Secondary | ICD-10-CM

## 2015-10-26 DIAGNOSIS — O09523 Supervision of elderly multigravida, third trimester: Secondary | ICD-10-CM

## 2015-10-26 LAB — POCT URINALYSIS DIP (DEVICE)
Bilirubin Urine: NEGATIVE
Glucose, UA: NEGATIVE mg/dL
Ketones, ur: NEGATIVE mg/dL
Nitrite: NEGATIVE
PH: 5.5 (ref 5.0–8.0)
Protein, ur: NEGATIVE mg/dL
UROBILINOGEN UA: 0.2 mg/dL (ref 0.0–1.0)

## 2015-10-26 NOTE — Progress Notes (Signed)
Subjective:  Rita Lamb is a 40 y.o. G3P2002 at [redacted]w[redacted]d being seen today for ongoing prenatal care.  She is currently monitored for the following issues for this high-risk pregnancy and has Asthma, intermittent; Hypertension; Obesity-class 3/Morbid; History of gestational diabetes in prior pregnancy, currently pregnant; Astigmatism of left eye; Impaired glucose tolerance-last A1c 5.3; Hirsutism; Hyperlipidemia LDL goal < 100; Irregular periods; Hypertension in pregnancy, essential, antepartum; Supervision of high risk pregnancy, antepartum; Biological false positive RPR test; History of abnormal cervical Pap smear; Gestational diabetes mellitus, antepartum; Obesity in pregnancy, antepartum; and Advanced maternal age in multigravida on her problem list.  Patient reports no complaints.  Contractions: Not present. Vag. Bleeding: None.  Movement: Present. Denies leaking of fluid.   CBGs fastings 94,95,95,87,93.78.95, 124 (had cake this morning when power went out) Postprandials are all nml   The following portions of the patient's history were reviewed and updated as appropriate: allergies, current medications, past family history, past medical history, past social history, past surgical history and problem list. Problem list updated. Large pannus making fundal height inaccurate.  Pt has serial [place] for growth.   Objective:   Filed Vitals:   10/26/15 0926  BP: 138/84  Pulse: 78  Weight: 260 lb 14.4 oz (118.343 kg)    Fetal Status: Fetal Heart Rate (bpm): 143   Movement: Present     General:  Alert, oriented and cooperative. Patient is in no acute distress.  Skin: Skin is warm and dry. No rash noted.   Cardiovascular: Normal heart rate noted  Respiratory: Normal respiratory effort, no problems with respiration noted  Abdomen: Soft, gravid, appropriate for gestational age. Pain/Pressure: Present     Pelvic: Cervical exam deferred        Extremities: Normal range of motion.     Mental Status:  Normal mood and affect. Normal behavior. Normal judgment and thought content.   Urinalysis:    Neg/Neg  Assessment and Plan:  Pregnancy: G3P2002 at [redacted]w[redacted]d  1. Supervision of high risk pregnancy, antepartum, third trimester -continue medications for DM--good control.  Avoid cake. -Interested in [place] (has [place]) -BP wel controlled. -pt needs to make opthalmologic appt  Preterm labor symptoms and general obstetric precautions including but not limited to vaginal bleeding, contractions, leaking of fluid and fetal movement were reviewed in detail with the patient. Please refer to After Visit Summary for other counseling recommendations.  Return in about 2 weeks (around 11/09/2015).   Guss Bunde, MD

## 2015-11-03 ENCOUNTER — Ambulatory Visit (HOSPITAL_COMMUNITY)
Admission: RE | Admit: 2015-11-03 | Discharge: 2015-11-03 | Disposition: A | Discharge status: Home / Self Care | Payer: BLUE CROSS/BLUE SHIELD | Source: Ambulatory Visit | Attending: Obstetrics & Gynecology | Admitting: Obstetrics & Gynecology

## 2015-11-03 ENCOUNTER — Encounter (HOSPITAL_COMMUNITY): Payer: Self-pay

## 2015-11-03 VITALS — BP 127/64 | HR 80 | Wt 259.5 lb

## 2015-11-03 DIAGNOSIS — Z8742 Personal history of other diseases of the female genital tract: Secondary | ICD-10-CM

## 2015-11-03 DIAGNOSIS — O10012 Pre-existing essential hypertension complicating pregnancy, second trimester: Secondary | ICD-10-CM

## 2015-11-03 DIAGNOSIS — O09523 Supervision of elderly multigravida, third trimester: Secondary | ICD-10-CM | POA: Diagnosis not present

## 2015-11-03 DIAGNOSIS — O24415 Gestational diabetes mellitus in pregnancy, controlled by oral hypoglycemic drugs: Secondary | ICD-10-CM | POA: Insufficient documentation

## 2015-11-03 DIAGNOSIS — O99213 Obesity complicating pregnancy, third trimester: Secondary | ICD-10-CM | POA: Diagnosis not present

## 2015-11-03 DIAGNOSIS — O0993 Supervision of high risk pregnancy, unspecified, third trimester: Secondary | ICD-10-CM

## 2015-11-03 DIAGNOSIS — O09293 Supervision of pregnancy with other poor reproductive or obstetric history, third trimester: Secondary | ICD-10-CM | POA: Insufficient documentation

## 2015-11-03 DIAGNOSIS — R7689 Other specified abnormal immunological findings in serum: Secondary | ICD-10-CM

## 2015-11-03 DIAGNOSIS — O10013 Pre-existing essential hypertension complicating pregnancy, third trimester: Secondary | ICD-10-CM | POA: Insufficient documentation

## 2015-11-03 DIAGNOSIS — Z3A30 30 weeks gestation of pregnancy: Secondary | ICD-10-CM | POA: Insufficient documentation

## 2015-11-03 DIAGNOSIS — R768 Other specified abnormal immunological findings in serum: Secondary | ICD-10-CM

## 2015-11-03 DIAGNOSIS — O10919 Unspecified pre-existing hypertension complicating pregnancy, unspecified trimester: Secondary | ICD-10-CM

## 2015-11-03 DIAGNOSIS — O24419 Gestational diabetes mellitus in pregnancy, unspecified control: Secondary | ICD-10-CM

## 2015-11-03 DIAGNOSIS — O09522 Supervision of elderly multigravida, second trimester: Secondary | ICD-10-CM

## 2015-11-09 ENCOUNTER — Ambulatory Visit (INDEPENDENT_AMBULATORY_CARE_PROVIDER_SITE_OTHER): Payer: BLUE CROSS/BLUE SHIELD | Admitting: Obstetrics & Gynecology

## 2015-11-09 ENCOUNTER — Encounter: Payer: Self-pay | Admitting: Obstetrics & Gynecology

## 2015-11-09 VITALS — BP 113/60 | HR 88 | Wt 258.7 lb

## 2015-11-09 DIAGNOSIS — O09523 Supervision of elderly multigravida, third trimester: Secondary | ICD-10-CM

## 2015-11-09 DIAGNOSIS — E669 Obesity, unspecified: Secondary | ICD-10-CM

## 2015-11-09 DIAGNOSIS — O24419 Gestational diabetes mellitus in pregnancy, unspecified control: Secondary | ICD-10-CM

## 2015-11-09 DIAGNOSIS — O10013 Pre-existing essential hypertension complicating pregnancy, third trimester: Secondary | ICD-10-CM

## 2015-11-09 DIAGNOSIS — Z8632 Personal history of gestational diabetes: Secondary | ICD-10-CM

## 2015-11-09 DIAGNOSIS — O09293 Supervision of pregnancy with other poor reproductive or obstetric history, third trimester: Secondary | ICD-10-CM

## 2015-11-09 DIAGNOSIS — J4520 Mild intermittent asthma, uncomplicated: Secondary | ICD-10-CM

## 2015-11-09 DIAGNOSIS — O0993 Supervision of high risk pregnancy, unspecified, third trimester: Secondary | ICD-10-CM

## 2015-11-09 DIAGNOSIS — O99213 Obesity complicating pregnancy, third trimester: Secondary | ICD-10-CM

## 2015-11-09 LAB — POCT URINALYSIS DIP (DEVICE)
Bilirubin Urine: NEGATIVE
GLUCOSE, UA: NEGATIVE mg/dL
KETONES UR: NEGATIVE mg/dL
Nitrite: NEGATIVE
Protein, ur: NEGATIVE mg/dL
SPECIFIC GRAVITY, URINE: 1.025 (ref 1.005–1.030)
UROBILINOGEN UA: 0.2 mg/dL (ref 0.0–1.0)
pH: 5.5 (ref 5.0–8.0)

## 2015-11-09 NOTE — Patient Instructions (Signed)
Laparoscopic Tubal Ligation Laparoscopic tubal ligation is a procedure that closes the fallopian tubes at a time other than right after childbirth. When the fallopian tubes are closed, the eggs that are released from the ovaries cannot enter the uterus, and sperm cannot reach the egg. Tubal ligation is also known as getting your "tubes tied." Tubal ligation is done so you will not be able to get pregnant or have a baby. Although this procedure may be undone (reversed), it should be considered permanent and irreversible. If you want to have future pregnancies, you should not have this procedure. LET [facility] CARE PROVIDER KNOW ABOUT:  Any allergies you have.  All medicines you are taking, including vitamins, herbs, eye drops, creams, and over-the-counter medicines. This includes any use of steroids, either by mouth or in cream form.  Previous problems you or members of your family have had with the use of anesthetics.  Any blood disorders you have.  Previous surgeries you have had.  Any medical conditions you may have.  Possibility of pregnancy, if this applies.  Any past pregnancies. RISKS AND COMPLICATIONS  Infection.  Bleeding.  Injury to surrounding organs.  Side effects from anesthetics.  Failure of the procedure.  Ectopic pregnancy.  Future regret about having the procedure done. BEFORE THE PROCEDURE  Ask your health care provider about:  Changing or stopping your regular medicines. This is especially important if you are taking diabetes medicines or blood thinners.  Taking medicines such as aspirin and ibuprofen. These medicines can thin your blood. Do not take these medicines before your procedure if your health care provider instructs you not to.  Follow instructions from your health care provider about eating and drinking restrictions.  Plan to have someone take you home after the procedure.  If you go home right after the procedure, plan to have someone  with you for 24 hours. PROCEDURE  You will be given one or more of the following:  A medicine that helps you relax (sedative).  A medicine that numbs the area (local anesthetic).  A medicine that makes you fall asleep (general anesthetic).  A medicine that is injected into an area of your body that numbs everything below the injection site (regional anesthetic).  If you have been given general anesthetic, a tube will be put down your throat to help you breathe.  Two small cuts (incisions) will be made in the lower abdominal area and near the belly button.  Your bladder may be emptied with a small tube (catheter).  Your abdomen will be inflated with a safe gas (carbon dioxide). This will help to give the surgeon room to operate and visualize, and it will help the surgeon to avoid other organs.  A thin, lighted tube (laparoscope) with a camera attached will be inserted into your abdomen through one of the incisions near the belly button. Other small instruments will be inserted through the other abdominal incision.  The fallopian tubes will be tied off or burned (cauterized), or they will be blocked with a clip, ring, or clamp. In many cases, a small portion in the center of each fallopian tube will also be removed.  After the fallopian tubes are blocked, the gas will be released from the abdomen.  The incisions will be closed with stitches (sutures).  A bandage (dressing) will be placed over the incisions. The procedure may vary among health care providers and hospitals. AFTER THE PROCEDURE  Your blood pressure, heart rate, breathing rate, and blood oxygen level  will be monitored often until the medicines you were given have worn off.  You will be given pain medicine as needed.  If you had general anesthetic, you may have some mild discomfort in your throat. This is from the breathing tube that was placed in your throat while you were sleeping.  You may experience discomfort in  the shoulder area from some trapped air between your liver and your diaphragm. This sensation is normal, and it will slowly go away on its own.  You will have some mild abdominal discomfort for 3--7 days.   This information is not intended to replace advice given to you by your health care provider. Make sure you discuss any questions you have with your health care provider.   Document Released: 08/01/2000 Document Revised: 09/09/2014 Document Reviewed: 08/06/2011 Elsevier Interactive Patient Education [employer].

## 2015-11-09 NOTE — Progress Notes (Signed)
Subjective:  Rita Lamb is a 40 y.o. G3P2002 at [redacted]w[redacted]d being seen today for ongoing prenatal care.  She is currently monitored for the following issues for this high-risk pregnancy and has Asthma, intermittent; Hypertension; Obesity-class 3/Morbid; History of gestational diabetes in prior pregnancy, currently pregnant; Astigmatism of left eye; Impaired glucose tolerance-last A1c 5.3; Hirsutism; Hyperlipidemia LDL goal < 100; Irregular periods; Hypertension in pregnancy, essential, antepartum; Supervision of high risk pregnancy, antepartum; Biological false positive RPR test; History of abnormal cervical Pap smear; Gestational diabetes mellitus, antepartum; Obesity in pregnancy, antepartum; and Advanced maternal age in multigravida on her problem list.  Patient reports no complaints.  Contractions: Not present. Vag. Bleeding: None.  Movement: Present. Denies leaking of fluid.   The following portions of the patient's history were reviewed and updated as appropriate: allergies, current medications, past family history, past medical history, past social history, past surgical history and problem list. Problem list updated.  Objective:   Filed Vitals:   11/09/15 1247  BP: 113/60  Pulse: 88  Weight: 258 lb 11.2 oz (117.346 kg)    Fetal Status: Fetal Heart Rate (bpm): 148   Movement: Present     General:  Alert, oriented and cooperative. Patient is in no acute distress.  Skin: Skin is warm and dry. No rash noted.   Cardiovascular: Normal heart rate noted  Respiratory: Normal respiratory effort, no problems with respiration noted  Abdomen: Soft, gravid, appropriate for gestational age. Pain/Pressure: Present     Pelvic: Cervical exam deferred        Extremities: Normal range of motion.  Edema: None  Mental Status: Normal mood and affect. Normal behavior. Normal judgment and thought content.   Urinalysis: Urine Protein: Negative Urine Glucose: Negative  Assessment and Plan:  Pregnancy:  G3P2002 at [redacted]w[redacted]d  1. Supervision of high risk pregnancy, antepartum, third trimester   2. Gestational diabetes mellitus, antepartum All glucose levels <130 Deliver at 39 weeks  3. Hypertension in pregnancy, essential, antepartum, third trimester Well controlled Deliver at 39 weeks  4. Obesity in pregnancy, antepartum, third trimester  5. Asthma, intermittent, uncomplicated controlled  6. History of gestational diabetes in prior pregnancy, currently pregnant, third trimester See above  7. Advanced maternal age in multigravida, third trimester Deliver at 90 weeks  Preterm labor symptoms and general obstetric precautions including but not limited to vaginal bleeding, contractions, leaking of fluid and fetal movement were reviewed in detail with the patient. Please refer to After Visit Summary for other counseling recommendations.  Return in about 2 weeks (around 11/23/2015).   Lavonia Drafts, MD

## 2015-11-12 ENCOUNTER — Other Ambulatory Visit: Payer: Self-pay | Admitting: Family

## 2015-11-15 ENCOUNTER — Inpatient Hospital Stay (HOSPITAL_COMMUNITY)
Admission: AD | Admit: 2015-11-15 | Discharge: 2015-11-15 | Disposition: A | Discharge status: Home / Self Care | Payer: BLUE CROSS/BLUE SHIELD | Source: Ambulatory Visit | Attending: Obstetrics & Gynecology | Admitting: Obstetrics & Gynecology

## 2015-11-15 ENCOUNTER — Encounter (HOSPITAL_COMMUNITY): Payer: Self-pay | Admitting: *Deleted

## 2015-11-15 DIAGNOSIS — O99283 Endocrine, nutritional and metabolic diseases complicating pregnancy, third trimester: Secondary | ICD-10-CM | POA: Insufficient documentation

## 2015-11-15 DIAGNOSIS — O09292 Supervision of pregnancy with other poor reproductive or obstetric history, second trimester: Secondary | ICD-10-CM

## 2015-11-15 DIAGNOSIS — O24419 Gestational diabetes mellitus in pregnancy, unspecified control: Secondary | ICD-10-CM | POA: Insufficient documentation

## 2015-11-15 DIAGNOSIS — M170 Bilateral primary osteoarthritis of knee: Secondary | ICD-10-CM | POA: Insufficient documentation

## 2015-11-15 DIAGNOSIS — R739 Hyperglycemia, unspecified: Secondary | ICD-10-CM

## 2015-11-15 DIAGNOSIS — J45909 Unspecified asthma, uncomplicated: Secondary | ICD-10-CM | POA: Diagnosis not present

## 2015-11-15 DIAGNOSIS — O99213 Obesity complicating pregnancy, third trimester: Secondary | ICD-10-CM | POA: Insufficient documentation

## 2015-11-15 DIAGNOSIS — Z7722 Contact with and (suspected) exposure to environmental tobacco smoke (acute) (chronic): Secondary | ICD-10-CM | POA: Insufficient documentation

## 2015-11-15 DIAGNOSIS — Z3A31 31 weeks gestation of pregnancy: Secondary | ICD-10-CM | POA: Diagnosis not present

## 2015-11-15 DIAGNOSIS — O163 Unspecified maternal hypertension, third trimester: Secondary | ICD-10-CM | POA: Insufficient documentation

## 2015-11-15 DIAGNOSIS — O99513 Diseases of the respiratory system complicating pregnancy, third trimester: Secondary | ICD-10-CM | POA: Insufficient documentation

## 2015-11-15 DIAGNOSIS — Z8632 Personal history of gestational diabetes: Secondary | ICD-10-CM

## 2015-11-15 LAB — URINE MICROSCOPIC-ADD ON

## 2015-11-15 LAB — URINALYSIS, ROUTINE W REFLEX MICROSCOPIC
BILIRUBIN URINE: NEGATIVE
Glucose, UA: 250 mg/dL — AB
HGB URINE DIPSTICK: NEGATIVE
Ketones, ur: NEGATIVE mg/dL
NITRITE: NEGATIVE
PROTEIN: NEGATIVE mg/dL
Specific Gravity, Urine: >1.03 — ABNORMAL HIGH (ref 1.005–1.030)
pH: 5.5 (ref 5.0–8.0)

## 2015-11-15 LAB — COMPREHENSIVE METABOLIC PANEL
ALT: 17 U/L (ref 14–54)
ANION GAP: 6 (ref 5–15)
AST: 13 U/L — ABNORMAL LOW (ref 15–41)
Albumin: 3.1 g/dL — ABNORMAL LOW (ref 3.5–5.0)
Alkaline Phosphatase: 105 U/L (ref 38–126)
BUN: 8 mg/dL (ref 6–20)
CHLORIDE: 104 mmol/L (ref 101–111)
CO2: 22 mmol/L (ref 22–32)
CREATININE: 0.45 mg/dL (ref 0.44–1.00)
Calcium: 9.2 mg/dL (ref 8.9–10.3)
Glucose, Bld: 103 mg/dL — ABNORMAL HIGH (ref 65–99)
POTASSIUM: 3.8 mmol/L (ref 3.5–5.1)
SODIUM: 132 mmol/L — AB (ref 135–145)
Total Bilirubin: 0.2 mg/dL — ABNORMAL LOW (ref 0.3–1.2)
Total Protein: 7.3 g/dL (ref 6.5–8.1)

## 2015-11-15 LAB — GLUCOSE, CAPILLARY: GLUCOSE-CAPILLARY: 120 mg/dL — AB (ref 65–99)

## 2015-11-15 MED ORDER — ACETAMINOPHEN 500 MG PO TABS
1000.0000 mg | ORAL_TABLET | Freq: Once | ORAL | Status: AC
  Administered 2015-11-15: 1000 mg via ORAL
  Filled 2015-11-15: qty 2

## 2015-11-15 MED ORDER — GLYBURIDE 2.5 MG PO TABS: 5.0000 mg | ORAL_TABLET | Freq: Every day | ORAL | Status: DC

## 2015-11-15 MED ORDER — GLYBURIDE 2.5 MG PO TABS: ORAL_TABLET | ORAL | Status: DC

## 2015-11-15 NOTE — MAU Provider Note (Signed)
History     CSN: [id number]  Arrival date and time: 11/15/15 1340   None     No chief complaint on file.  HPI    [name] is a 40 y.o. G3P2002 at 53w1dbeing seen today in the MAU with concerns of elevated BS readings she had at home over the weekend. She checked her BS today and it was 243.   She is currently monitored for the following issues for this high-risk pregnancy and has Asthma, intermittent; Hypertension; Obesity-class 3/Morbid; History of gestational diabetes in prior pregnancy, currently pregnant; Astigmatism of left eye; Impaired glucose tolerance-last A1c 5.3; Hirsutism; Hyperlipidemia LDL goal < 100; Irregular periods; Hypertension in pregnancy, essential, antepartum; Supervision of high risk pregnancy, antepartum; Gestational diabetes mellitus, antepartum; Obesity in pregnancy, antepartum; and Advanced maternal age in multigravida on her problem list.   Patient has brought her BS log with her to MAU:   Friday 7/7: Fasting 189 Saturday 7/8 Fasting 129, then before meal- 228 Sunday 7/9 Fasting 143 then before meal-  243  She denies pain.   OB History    Gravida Para Term Preterm AB TAB SAB Ectopic Multiple Living   _0 Past Medical History  Diagnosis Date  . Bell's palsy   . Helicobacter pylori (H. pylori) infection   . Arthritis     bilateral knee  . Asthma   . Gestational diabetes   . Hypertension     Past Surgical History  Procedure Laterality Date  . Tonsillectomy and adenoidectomy    . Cryotherapy  1996    cervix    Family History  Problem Relation Age of Onset  . Diabetes Father   . Diabetes Sister   . Alcohol abuse Sister   . Colon cancer Paternal Grandfather   . Cancer Paternal Grandfather   . Stroke Paternal Grandfather   . Alcohol abuse Mother   . HIV Mother   . Diabetes Mother   . Dementia Maternal Grandmother   . Cancer Maternal Grandmother 546   colon,   . Cancer Maternal Grandfather   . Diabetes  Maternal Grandfather   . Cancer Paternal Grandmother   . Heart disease Paternal Grandmother   . Diabetes Paternal Grandmother     Social History  Substance Use Topics  . Smoking status: Passive Smoke Exposure - Never Smoker  . Smokeless tobacco: Never Used  . Alcohol Use: No    Allergies:  Allergies  Allergen Reactions  . Sulfa Antibiotics Rash    Prescriptions prior to admission  Medication Sig Dispense Refill Last Dose  . acetaminophen (TYLENOL) 500 MG tablet Take 1-2 tabs every 6 hours for pain. 30 tablet 0 Past Month at Unknown time  . albuterol (PROVENTIL HFA;VENTOLIN HFA) 108 (90 BASE) MCG/ACT inhaler Inhale 2 puffs into the lungs every 6 (six) hours as needed for wheezing or shortness of breath. Reported on 08/17/2015   Past Month at Unknown time  . ASPIRIN LOW DOSE 81 MG chewable tablet CHEW AND SWALLOW 1 TABLET(81 MG) BY MOUTH DAILY 30 tablet 0 11/15/2015 at Unknown time  . glyBURIDE (DIABETA) 2.5 MG tablet 1 in the morning and 2.5 at night. Can increase to 3 at night for fasting glucose > 95 120 tablet 3 11/15/2015 at Unknown time  . hydrochlorothiazide (HYDRODIURIL) 25 MG tablet Take 1 tablet (25 mg total) by mouth daily. 30 tablet 3 11/15/2015 at Unknown time  .  labetalol (NORMODYNE) 200 MG tablet Take 1 tablet (200 mg total) by mouth 2 (two) times daily. 120 tablet 3 11/15/2015 at Unknown time  . Prenatal Vit-Min-FA-Fish Oil ([facility] PRENATAL GUMMY PO) Take 1 tablet by mouth daily.    Past Week at Unknown time  . BAYER MICROLET LANCETS lancets Lancets compatible with Bayer Contour Monitor for testing 4 times daily DX GDM O24.419 100 each 12 Taking  . Blood Glucose Monitoring Suppl (BAYER CONTOUR MONITOR) w/Device KIT 1 each by Does not apply route 4 (four) times daily. For testing four times per day DX GDM O24.419 1 each 0 Taking  . glucose blood (BAYER CONTOUR TEST) test strip For testing 4 times daily. New DX GDM O24.419 100 each 12 Taking   Results for orders placed or performed  during the hospital encounter of 11/15/15 (from the past 48 hour(s))  Glucose, capillary     Status: Abnormal   Collection Time: 11/15/15  2:03 PM  Result Value Ref Range   Glucose-Capillary 120 (H) 65 - 99 mg/dL  Comprehensive metabolic panel     Status: Abnormal   Collection Time: 11/15/15  2:40 PM  Result Value Ref Range   Sodium 132 (L) 135 - 145 mmol/L   Potassium 3.8 3.5 - 5.1 mmol/L   Chloride 104 101 - 111 mmol/L   CO2 22 22 - 32 mmol/L   Glucose, Bld 103 (H) 65 - 99 mg/dL   BUN 8 6 - 20 mg/dL   Creatinine, Ser 0.45 0.44 - 1.00 mg/dL   Calcium 9.2 8.9 - 10.3 mg/dL   Total Protein 7.3 6.5 - 8.1 g/dL   Albumin 3.1 (L) 3.5 - 5.0 g/dL   AST 13 (L) 15 - 41 U/L   ALT 17 14 - 54 U/L   Alkaline Phosphatase 105 38 - 126 U/L   Total Bilirubin 0.2 (L) 0.3 - 1.2 mg/dL   GFR calc non Af Amer >60 >60 mL/min   GFR calc Af Amer >60 >60 mL/min    Comment: (NOTE) The eGFR has been calculated using the CKD EPI equation. This calculation has not been validated in all clinical situations. eGFR's persistently <60 mL/min signify possible Chronic Kidney Disease.    Anion gap 6 5 - 15    Results for orders placed or performed during the hospital encounter of 11/15/15 (from the past 48 hour(s))  Urinalysis, Routine w reflex microscopic (not at [facility])     Status: Abnormal   Collection Time: 11/15/15  1:55 PM  Result Value Ref Range   Color, Urine YELLOW YELLOW   APPearance HAZY (A) CLEAR   Specific Gravity, Urine >1.030 (H) 1.005 - 1.030   pH 5.5 5.0 - 8.0   Glucose, UA 250 (A) NEGATIVE mg/dL   Hgb urine dipstick NEGATIVE NEGATIVE   Bilirubin Urine NEGATIVE NEGATIVE   Ketones, ur NEGATIVE NEGATIVE mg/dL   Protein, ur NEGATIVE NEGATIVE mg/dL   Nitrite NEGATIVE NEGATIVE   Leukocytes, UA SMALL (A) NEGATIVE  Urine microscopic-add on     Status: Abnormal   Collection Time: 11/15/15  1:55 PM  Result Value Ref Range   Squamous Epithelial / LPF 0-5 (A) NONE SEEN   WBC, UA 6-30 0 - 5 WBC/hpf    RBC / HPF 0-5 0 - 5 RBC/hpf   Bacteria, UA MANY (A) NONE SEEN  Glucose, capillary     Status: Abnormal   Collection Time: 11/15/15  2:03 PM  Result Value Ref Range   Glucose-Capillary 120 (H) 65 -   99 mg/dL  Comprehensive metabolic panel     Status: Abnormal   Collection Time: 11/15/15  2:40 PM  Result Value Ref Range   Sodium 132 (L) 135 - 145 mmol/L   Potassium 3.8 3.5 - 5.1 mmol/L   Chloride 104 101 - 111 mmol/L   CO2 22 22 - 32 mmol/L   Glucose, Bld 103 (H) 65 - 99 mg/dL   BUN 8 6 - 20 mg/dL   Creatinine, Ser 0.45 0.44 - 1.00 mg/dL   Calcium 9.2 8.9 - 10.3 mg/dL   Total Protein 7.3 6.5 - 8.1 g/dL   Albumin 3.1 (L) 3.5 - 5.0 g/dL   AST 13 (L) 15 - 41 U/L   ALT 17 14 - 54 U/L   Alkaline Phosphatase 105 38 - 126 U/L   Total Bilirubin 0.2 (L) 0.3 - 1.2 mg/dL   GFR calc non Af Amer >60 >60 mL/min   GFR calc Af Amer >60 >60 mL/min    Comment: (NOTE) The eGFR has been calculated using the CKD EPI equation. This calculation has not been validated in all clinical situations. eGFR's persistently <60 mL/min signify possible Chronic Kidney Disease.    Anion gap 6 5 - 15    Review of Systems  Constitutional: Negative for fever and chills.  Gastrointestinal: Negative for nausea, vomiting and abdominal pain.  Genitourinary: Negative for dysuria, urgency and frequency.   Physical Exam   Blood pressure 125/67, pulse 88, resp. rate 16, last menstrual period 02/16/2015, SpO2 100 %.  Physical Exam  Constitutional: She is oriented to person, place, and time. She appears well-developed and well-nourished. No distress.  HENT:  Head: Normocephalic.  Eyes: Pupils are equal, round, and reactive to light.  Genitourinary:  Dilation: Closed Effacement (%): Thick Cervical Position: Posterior Exam by:: Jennifer Rasch, NP  Neurological: She is alert and oriented to person, place, and time.  Skin: Skin is warm. She is not diaphoretic.  Psychiatric: Her behavior is normal.   Fetal  Tracing: Baseline: 145 bpm  Variability: moderate Accelerations: 15x15 Decelerations: none Toco: Q2-4 with UI   MAU Course  Procedures  None  MDM  Discussed with Dr. Eure BS logs and HPI   Assessment and Plan   A:  1. Elevated blood sugar level   2. Gestational diabetes mellitus, antepartum   3. History of gestational diabetes in prior pregnancy, currently pregnant, second trimester      P:  Discharge home in stable condition Increase Glyberide to 10 mg in PM and 5 mg PO in AM Follow up with the [facility] as scheduled  Fetal kick counts Return to MAU if symptoms worsen    Jennifer I Rasch, NP 11/17/2015 1:34 PM  

## 2015-11-15 NOTE — Discharge Instructions (Signed)
Blood Glucose Monitoring, Adult ° °Monitoring your blood glucose (also know as blood sugar) helps you to manage your diabetes. It also helps you and your health care provider monitor your diabetes and determine how well your treatment plan is working. °WHY SHOULD YOU MONITOR YOUR BLOOD GLUCOSE? °· It can help you understand how food, exercise, and medicine affect your blood glucose. °· It allows you to know what your blood glucose is at any given moment. You can quickly tell if you are having low blood glucose (hypoglycemia) or high blood glucose (hyperglycemia). °· It can help you and your health care provider know how to adjust your medicines. °· It can help you understand how to manage an illness or adjust medicine for exercise. °WHEN SHOULD YOU TEST? °Your health care provider will help you decide how often you should check your blood glucose. This may depend on the type of diabetes you have, your diabetes control, or the types of medicines you are taking. Be sure to write down all of your blood glucose readings so that this information can be reviewed with your health care provider. See below for examples of testing times that your health care provider may suggest. °Type 1 Diabetes °· Test at least 2 times per day if your diabetes is well controlled, if you are using an insulin pump, or if you perform multiple daily injections. °· If your diabetes is not well controlled or if you are sick, you may need to test more often. °· It is a good idea to also test: °¨ Before every insulin injection. °¨ Before and after exercise. °¨ Between meals and 2 hours after a meal. °¨ Occasionally between 2:00 a.m. and 3:00 a.m. °Type 2 Diabetes °· If you are taking insulin, test at least 2 times per day. However, it is best to test before every insulin injection. °· If you take medicines by mouth (orally), test 2 times a day. °· If you are on a controlled diet, test once a day. °· If your diabetes is not well controlled or if you  are sick, you may need to monitor more often. °HOW TO MONITOR YOUR BLOOD GLUCOSE °Supplies Needed °· Blood glucose meter. °· Test strips for your meter. Each meter has its own strips. You must use the strips that go with your own meter. °· A pricking needle (lancet). °· A device that holds the lancet (lancing device). °· A journal or log book to write down your results. °Procedure °· Wash your hands with soap and water. Alcohol is not preferred. °· Prick the side of your finger (not the tip) with the lancet. °· Gently milk the finger until a small drop of blood appears. °· Follow the instructions that come with your meter for inserting the test strip, applying blood to the strip, and using your blood glucose meter. °Other Areas to Get Blood for Testing °Some meters allow you to use other areas of your body (other than your finger) to test your blood. These areas are called alternative sites. The most common alternative sites are: °· The forearm. °· The thigh. °· The back area of the lower leg. °· The palm of the hand. °The blood flow in these areas is slower. Therefore, the blood glucose values you get may be delayed, and the numbers are different from what you would get from your fingers. Do not use alternative sites if you think you are having hypoglycemia. Your reading will not be accurate. Always use a finger if you   are having hypoglycemia. Also, if you cannot feel your lows (hypoglycemia unawareness), always use your fingers for your blood glucose checks. °ADDITIONAL TIPS FOR GLUCOSE MONITORING °· Do not reuse lancets. °· Always carry your supplies with you. °· All blood glucose meters have a 24-hour "hotline" number to call if you have questions or need help. °· Adjust (calibrate) your blood glucose meter with a control solution after finishing a few boxes of strips. °BLOOD GLUCOSE RECORD KEEPING °It is a good idea to keep a daily record or log of your blood glucose readings. Most glucose meters, if not all,  keep your glucose records stored in the meter. Some meters come with the ability to download your records to your home computer. Keeping a record of your blood glucose readings is especially helpful if you are wanting to look for patterns. Make notes to go along with the blood glucose readings because you might forget what happened at that exact time. Keeping good records helps you and your health care provider to work together to achieve good diabetes management.  °  °This information is not intended to replace advice given to you by your health care provider. Make sure you discuss any questions you have with your health care provider. °  °Document Released: 04/28/2003 Document Revised: 05/16/2014 Document Reviewed: 09/17/2012 °Elsevier Interactive Patient Education ©2016 [employer] ° °

## 2015-11-15 NOTE — MAU Note (Signed)
Patient has GDDM blood sugar "not acting right', not feeling well, blood sugar after breakfast was 243, headache, dizziness, nausea. Taking Glyburide had been working until the last couple of days.

## 2015-11-19 ENCOUNTER — Other Ambulatory Visit: Payer: BLUE CROSS/BLUE SHIELD

## 2015-11-23 ENCOUNTER — Ambulatory Visit (INDEPENDENT_AMBULATORY_CARE_PROVIDER_SITE_OTHER): Payer: BLUE CROSS/BLUE SHIELD | Admitting: Family Medicine

## 2015-11-23 VITALS — BP 124/66 | HR 80 | Wt 259.6 lb

## 2015-11-23 DIAGNOSIS — O10013 Pre-existing essential hypertension complicating pregnancy, third trimester: Secondary | ICD-10-CM

## 2015-11-23 DIAGNOSIS — O0993 Supervision of high risk pregnancy, unspecified, third trimester: Secondary | ICD-10-CM

## 2015-11-23 DIAGNOSIS — O24419 Gestational diabetes mellitus in pregnancy, unspecified control: Secondary | ICD-10-CM

## 2015-11-23 LAB — POCT URINALYSIS DIP (DEVICE)
GLUCOSE, UA: 100 mg/dL — AB
Ketones, ur: NEGATIVE mg/dL
Nitrite: NEGATIVE
PROTEIN: 30 mg/dL — AB
Specific Gravity, Urine: >=1.03 (ref 1.005–1.030)
UROBILINOGEN UA: 0.2 mg/dL (ref 0.0–1.0)
pH: 5.5 (ref 5.0–8.0)

## 2015-11-23 MED ORDER — METFORMIN HCL 500 MG PO TABS: 500.0000 mg | ORAL_TABLET | Freq: Two times a day (BID) | ORAL | Status: DC

## 2015-11-23 NOTE — Progress Notes (Signed)
Subjective:  Rita Lamb is a 40 y.o. G3P2002 at [redacted]w[redacted]d being seen today for ongoing prenatal care.  She is currently monitored for the following issues for this high-risk pregnancy and has Asthma, intermittent; Hypertension; Obesity-class 3/Morbid; History of gestational diabetes in prior pregnancy, currently pregnant; Astigmatism of left eye; Impaired glucose tolerance-last A1c 5.3; Hirsutism; Hyperlipidemia LDL goal < 100; Irregular periods; Hypertension in pregnancy, essential, antepartum; Supervision of high risk pregnancy, antepartum; Biological false positive RPR test; History of abnormal cervical Pap smear; Gestational diabetes mellitus, antepartum; Obesity in pregnancy, antepartum; and Advanced maternal age in multigravida on her problem list.  GDM: Patient taking Glyburide: 10mg  at night, 5mg  QAM.  Reports no hypoglycemic episodes.  Tolerating medication well Fasting: all greater than 90 (114-164) 2hr PP: 98 - 228, most elevated  Patient reports fatigue.  Contractions: Irregular. Vag. Bleeding: None.  Movement: Present. Denies leaking of fluid.   The following portions of the patient's history were reviewed and updated as appropriate: allergies, current medications, past family history, past medical history, past social history, past surgical history and problem list. Problem list updated.  Objective:   Filed Vitals:   11/23/15 0944  BP: 124/66  Pulse: 80  Weight: 259 lb 9.6 oz (117.754 kg)    Fetal Status: Fetal Heart Rate (bpm): NST - R   Movement: Present     General:  Alert, oriented and cooperative. Patient is in no acute distress.  Skin: Skin is warm and dry. No rash noted.   Cardiovascular: Normal heart rate noted  Respiratory: Normal respiratory effort, no problems with respiration noted  Abdomen: Soft, gravid, appropriate for gestational age. Pain/Pressure: Present     Pelvic: Vag. Bleeding: None     Cervical exam deferred        Extremities: Normal range of  motion.  Edema: Trace  Mental Status: Normal mood and affect. Normal behavior. Normal judgment and thought content.   Urinalysis:      Assessment and Plan:  Pregnancy: G3P2002 at [redacted]w[redacted]d  1. Supervision of high risk pregnancy, antepartum, third trimester FHT normal  2. Gestational diabetes mellitus, antepartum Continue glyburide.  Start metformin 500mg  BID.  Has growth next week. NST reactive. Continue stress test twice daily - Fetal nonstress test  3. Hypertension in pregnancy, essential, antepartum, third trimester Continue labetalol, daily ASA, HCTZ. - Fetal nonstress test  Preterm labor symptoms and general obstetric precautions including but not limited to vaginal bleeding, contractions, leaking of fluid and fetal movement were reviewed in detail with the patient. Please refer to After Visit Summary for other counseling recommendations.  No Follow-up on file.   Truett Mainland, DO

## 2015-11-26 ENCOUNTER — Ambulatory Visit (INDEPENDENT_AMBULATORY_CARE_PROVIDER_SITE_OTHER): Payer: BLUE CROSS/BLUE SHIELD | Admitting: *Deleted

## 2015-11-26 VITALS — BP 129/76 | HR 87

## 2015-11-26 DIAGNOSIS — Z36 Encounter for antenatal screening of mother: Secondary | ICD-10-CM

## 2015-11-26 DIAGNOSIS — O24419 Gestational diabetes mellitus in pregnancy, unspecified control: Secondary | ICD-10-CM | POA: Diagnosis not present

## 2015-11-26 DIAGNOSIS — O10013 Pre-existing essential hypertension complicating pregnancy, third trimester: Secondary | ICD-10-CM | POA: Diagnosis not present

## 2015-11-26 DIAGNOSIS — O09523 Supervision of elderly multigravida, third trimester: Secondary | ICD-10-CM

## 2015-11-26 NOTE — Progress Notes (Signed)
[place] for growth on 7/25, BPP added

## 2015-11-30 ENCOUNTER — Ambulatory Visit (INDEPENDENT_AMBULATORY_CARE_PROVIDER_SITE_OTHER): Payer: BLUE CROSS/BLUE SHIELD | Admitting: Obstetrics & Gynecology

## 2015-11-30 ENCOUNTER — Encounter (HOSPITAL_COMMUNITY): Payer: Self-pay

## 2015-11-30 ENCOUNTER — Encounter: Payer: BLUE CROSS/BLUE SHIELD | Attending: Obstetrics and Gynecology | Admitting: Dietician

## 2015-11-30 VITALS — BP 142/84 | HR 85 | Wt 262.9 lb

## 2015-11-30 DIAGNOSIS — O09523 Supervision of elderly multigravida, third trimester: Secondary | ICD-10-CM

## 2015-11-30 DIAGNOSIS — O24419 Gestational diabetes mellitus in pregnancy, unspecified control: Secondary | ICD-10-CM | POA: Diagnosis not present

## 2015-11-30 DIAGNOSIS — O10013 Pre-existing essential hypertension complicating pregnancy, third trimester: Secondary | ICD-10-CM

## 2015-11-30 DIAGNOSIS — Z713 Dietary counseling and surveillance: Secondary | ICD-10-CM | POA: Insufficient documentation

## 2015-11-30 MED ORDER — GLYBURIDE 5 MG PO TABS: ORAL_TABLET | ORAL | 2 refills | Status: DC

## 2015-11-30 MED ORDER — METFORMIN HCL 1000 MG PO TABS: 1000.0000 mg | ORAL_TABLET | Freq: Two times a day (BID) | ORAL | 2 refills | Status: DC

## 2015-11-30 MED ORDER — GLYBURIDE 5 MG PO TABS: 10.0000 mg | ORAL_TABLET | Freq: Two times a day (BID) | ORAL | 2 refills | Status: DC

## 2015-11-30 NOTE — Progress Notes (Signed)
Reactive NSt FBS 102-165, PP 80-165  Subjective:  Rita Lamb is a 40 y.o. G3P2002 at [redacted]w[redacted]d being seen today for ongoing prenatal care.  She is currently monitored for the following issues for this high-risk pregnancy and has Asthma, intermittent; Hypertension; Obesity-class 3/Morbid; History of gestational diabetes in prior pregnancy, currently pregnant; Astigmatism of left eye; Impaired glucose tolerance-last A1c 5.3; Hirsutism; Hyperlipidemia LDL goal < 100; Irregular periods; Hypertension in pregnancy, essential, antepartum; Supervision of high risk pregnancy, antepartum; Biological false positive RPR test; History of abnormal cervical Pap smear; Gestational diabetes mellitus, antepartum; Obesity in pregnancy, antepartum; and Advanced maternal age in multigravida on her problem list.  Patient reports fatigue and occasional contractions.  Contractions: Irregular. Vag. Bleeding: None.  Movement: Present. Denies leaking of fluid.   The following portions of the patient's history were reviewed and updated as appropriate: allergies, current medications, past family history, past medical history, past social history, past surgical history and problem list. Problem list updated.  Objective:   Vitals:   11/30/15 1056 11/30/15 1100  BP: (!) 141/90 (!) 142/84  Pulse: 85   Weight: 262 lb 14.4 oz (119.3 kg)     Fetal Status: Fetal Heart Rate (bpm): NST - R Fundal Height: 36 cm Movement: Present     General:  Alert, oriented and cooperative. Patient is in no acute distress.  Skin: Skin is warm and dry. No rash noted.   Cardiovascular: Normal heart rate noted  Respiratory: Normal respiratory effort, no problems with respiration noted  Abdomen: Soft, gravid, appropriate for gestational age. Pain/Pressure: Present     Pelvic:  Cervical exam deferred        Extremities: Normal range of motion.  Edema: Trace  Mental Status: Normal mood and affect. Normal behavior. Normal judgment and thought  content.   Urinalysis:      Assessment and Plan:  Pregnancy: G3P2002 at [redacted]w[redacted]d  1. Hypertension in pregnancy, essential, antepartum, third trimester Borderline today will not change medication - Fetal nonstress test  2. Advanced maternal age in multigravida, third trimester reactive - Fetal nonstress test  3. Gestational diabetes mellitus, antepartum Control needs improvement - Fetal nonstress test  Preterm labor symptoms and general obstetric precautions including but not limited to vaginal bleeding, contractions, leaking of fluid and fetal movement were reviewed in detail with the patient. Please refer to After Visit Summary for other counseling recommendations.  Return in about 7 days (around 12/07/2015) for Ob fu and NST/AFI.   Woodroe Mode, MD

## 2015-11-30 NOTE — Progress Notes (Signed)
Diabetes Education: 11/30/15 Referral for review of blood glucose levels and correlation with diet and medications.  Currently running elevated fasting glucose levels.  Many in the 100-120 range with a 165 when up at night to have a serving of ice cream. Post breakfast levels are elevated.  Generally breakfast is 2-3 eggs scrambled in Pam and 1 slice of bread.   Lunch is frequently late at 2-3 in the afternoon.  Post lunch readings are 80-104-122 mg levels.  Notes that she will delay lunch to go pick-up a group of children.   Post dinner levels are above normal ranges and again dependent on carb intake.    Completed review of the medication changes.   Glyburide increased to 10 mg before breakfast and dinner. Recommended taking 30 minutes before the meal. Metformin increased to 1000 mg at breakfast and 1000 mg at dinner.  Recommended taking with or following the meal.  Recommended that if her lunch is delayed, she take a snack of at least 15 gm of CHO and a protein prior to driving the bus to pick-up the children; then have her lunch and decrease the amount of CHO in the afternoon snack to 15 gm.  Review of post-delivery self-care measures to assist in the prevention/development of type 2 DM. Maggie Haniyah Maciolek, RN, RD, LDN

## 2015-11-30 NOTE — Progress Notes (Signed)
Pt denies headaches or visual disturbances.  [place] for growth and BPP tomorrow

## 2015-11-30 NOTE — Patient Instructions (Signed)
Gestational Diabetes Mellitus  Gestational diabetes mellitus, often simply referred to as gestational diabetes, is a type of diabetes that some women develop during pregnancy. In gestational diabetes, the pancreas does not make enough insulin (a hormone), the cells are less responsive to the insulin that is made (insulin resistance), or both. Normally, insulin moves sugars from food into the tissue cells. The tissue cells use the sugars for energy. The lack of insulin or the lack of normal response to insulin causes excess sugars to build up in the blood instead of going into the tissue cells. As a result, high blood sugar (hyperglycemia) develops. The effect of high sugar (glucose) levels can cause many problems.   RISK FACTORS  You have an increased chance of developing gestational diabetes if you have a family history of diabetes and also have one or more of the following risk factors:  · A body mass index over 30 (obesity).  · A previous pregnancy with gestational diabetes.  · An older age at the time of pregnancy.  If blood glucose levels are kept in the normal range during pregnancy, women can have a healthy pregnancy. If your blood glucose levels are not well controlled, there may be risks to you, your unborn baby (fetus), your labor and delivery, or your newborn baby.   SYMPTOMS   If symptoms are experienced, they are much like symptoms you would normally expect during pregnancy. The symptoms of gestational diabetes include:   · Increased thirst (polydipsia).  · Increased urination (polyuria).  · Increased urination during the night (nocturia).  · Weight loss. This weight loss may be rapid.  · Frequent, recurring infections.  · Tiredness (fatigue).  · Weakness.  · Vision changes, such as blurred vision.  · Fruity smell to your breath.  · Abdominal pain.  DIAGNOSIS  Diabetes is diagnosed when blood glucose levels are increased. Your blood glucose level may be checked by one or more of the following blood  tests:  · A fasting blood glucose test. You will not be allowed to eat for at least 8 hours before a blood sample is taken.  · A random blood glucose test. Your blood glucose is checked at any time of the day regardless of when you ate.  · An oral glucose tolerance test (OGTT). Your blood glucose is measured after you have not eaten (fasted) for 1-3 hours and then after you drink a glucose-containing beverage. Since the hormones that cause insulin resistance are highest at about 24-28 weeks of a pregnancy, an OGTT is usually performed during that time. If you have risk factors, you may be screened for undiagnosed type 2 diabetes at your first prenatal visit.  TREATMENT   Gestational diabetes should be managed first with diet and exercise. Medicines may be added only if they are needed.  · You will need to take diabetes medicine or insulin daily to keep blood glucose levels in the desired range.  · You will need to match insulin dosing with exercise and healthy food choices.  If you have gestational diabetes, your treatment goal is to maintain the following blood glucose levels:  · Before meals (preprandial): at or below 95 mg/dL.  · After meals (postprandial):    One hour after a meal: at or below 140 mg/dL.    Two hours after a meal: at or below 120 mg/dL.  If you have pre-existing type 1 or type 2 diabetes, your treatment goal is to maintain the following blood glucose levels:  · Before   meals, at bedtime, and overnight: 60-99 mg/dL.  · After meals: peak of 100-129 mg/dL.  HOME CARE INSTRUCTIONS   · Have your hemoglobin A1c level checked twice a year.  · Perform daily blood glucose monitoring as directed by your health care provider. It is common to perform frequent blood glucose monitoring.  · Monitor urine ketones when you are ill and as directed by your health care provider.  · Take your diabetes medicine and insulin as directed by your health care provider to maintain your blood glucose level in the desired  range.  ¨ Never run out of diabetes medicine or insulin. It is needed every day.  ¨ Adjust insulin based on your intake of carbohydrates. Carbohydrates can raise blood glucose levels but need to be included in your diet. Carbohydrates provide vitamins, minerals, and fiber which are an essential part of a healthy diet. Carbohydrates are found in fruits, vegetables, whole grains, dairy products, legumes, and foods containing added sugars.  · Eat healthy foods. Alternate 3 meals with 3 snacks.  · Maintain a healthy weight gain. The usual total expected weight gain varies according to your prepregnancy body mass index (BMI).  · Carry a medical alert card or wear your medical alert jewelry.  · Carry a 15-gram carbohydrate snack with you at all times to treat low blood glucose (hypoglycemia). Some examples of 15-gram carbohydrate snacks include:  ¨ Glucose tablets, 3 or 4.  ¨ Glucose gel, 15-gram tube.  ¨ Raisins, 2 tablespoons (24 g).  ¨ Jelly beans, 6.  ¨ Animal crackers, 8.  ¨ Fruit juice, regular soda, or low-fat milk, 4 ounces (120 mL).  ¨ Gummy treats, 9.  · Recognize hypoglycemia. Hypoglycemia during pregnancy occurs with blood glucose levels of 60 mg/dL and below. The risk for hypoglycemia increases when fasting or skipping meals, during or after intense exercise, and during sleep. Hypoglycemia symptoms can include:  ¨ Tremors or shakes.  ¨ Decreased ability to concentrate.  ¨ Sweating.  ¨ Increased heart rate.  ¨ Headache.  ¨ Dry mouth.  ¨ Hunger.  ¨ Irritability.  ¨ Anxiety.  ¨ Restless sleep.  ¨ Altered speech or coordination.  ¨ Confusion.  · Treat hypoglycemia promptly. If you are alert and able to safely swallow, follow the 15:15 rule:  ¨ Take 15-20 grams of rapid-acting glucose or carbohydrate. Rapid-acting options include glucose gel, glucose tablets, or 4 ounces (120 mL) of fruit juice, regular soda, or low-fat milk.  ¨ Check your blood glucose level 15 minutes after taking the glucose.  ¨ Take 15-20  grams more of glucose if the repeat blood glucose level is still 70 mg/dL or below.  ¨ Eat a meal or snack within 1 hour once blood glucose levels return to normal.  · Be alert to polyuria (excess urination) and polydipsia (excess thirst) which are early signs of hyperglycemia. An early awareness of hyperglycemia allows for prompt treatment. Treat hyperglycemia as directed by your health care provider.  · Engage in at least 30 minutes of physical activity a day or as directed by your health care provider. Ten minutes of physical activity timed 30 minutes after each meal is encouraged to control postprandial blood glucose levels.  · Adjust your insulin dosing and food intake as needed if you start a new exercise or sport.  · Follow your sick-day plan at any time you are unable to eat or drink as usual.  · Avoid tobacco and alcohol use.  · Keep all follow-up visits as directed   by your health care provider.  · Follow the advice of your health care provider regarding your prenatal and post-delivery (postpartum) appointments, meal planning, exercise, medicines, vitamins, blood tests, other medical tests, and physical activities.  · Perform daily skin and foot care. Examine your skin and feet daily for cuts, bruises, redness, nail problems, bleeding, blisters, or sores.  · Brush your teeth and gums at least twice a day and floss at least once a day. Follow up with your dentist regularly.  · Schedule an eye exam during the first trimester of your pregnancy or as directed by your health care provider.  · Share your diabetes management plan with your workplace or school.  · Stay up-to-date with immunizations.  · Learn to manage stress.  · Obtain ongoing diabetes education and support as needed.  · Learn about and consider breastfeeding your baby.  · You should have your blood sugar level checked 6-12 weeks after delivery. This is done with an oral glucose tolerance test (OGTT).  SEEK MEDICAL CARE IF:   · You are unable to  eat food or drink fluids for more than 6 hours.  · You have nausea and vomiting for more than 6 hours.  · You have a blood glucose level of 200 mg/dL and you have ketones in your urine.  · There is a change in mental status.  · You develop vision problems.  · You have a persistent headache.  · You have upper abdominal pain or discomfort.  · You develop an additional serious illness.  · You have diarrhea for more than 6 hours.  · You have been sick or have had a fever for a couple of days and are not getting better.  SEEK IMMEDIATE MEDICAL CARE IF:   · You have difficulty breathing.  · You no longer feel the baby moving.  · You are bleeding or have discharge from your vagina.  · You start having premature contractions or labor.  MAKE SURE YOU:  · Understand these instructions.  · Will watch your condition.  · Will get help right away if you are not doing well or get worse.     This information is not intended to replace advice given to you by your health care provider. Make sure you discuss any questions you have with your health care provider.     Document Released: 08/01/2000 Document Revised: 05/16/2014 Document Reviewed: 11/22/2011  Elsevier Interactive Patient Education ©2016 [employer]

## 2015-12-01 ENCOUNTER — Inpatient Hospital Stay (HOSPITAL_COMMUNITY)
Admission: AD | Admit: 2015-12-01 | Discharge: 2015-12-01 | Disposition: A | Discharge status: Home / Self Care | Payer: BLUE CROSS/BLUE SHIELD | Source: Ambulatory Visit | Attending: Obstetrics & Gynecology | Admitting: Obstetrics & Gynecology

## 2015-12-01 ENCOUNTER — Ambulatory Visit (HOSPITAL_COMMUNITY)
Admission: RE | Admit: 2015-12-01 | Discharge: 2015-12-01 | Disposition: A | Discharge status: Home / Self Care | Payer: BLUE CROSS/BLUE SHIELD | Source: Ambulatory Visit | Attending: Obstetrics & Gynecology | Admitting: Obstetrics & Gynecology

## 2015-12-01 ENCOUNTER — Other Ambulatory Visit: Payer: Self-pay | Admitting: Family Medicine

## 2015-12-01 ENCOUNTER — Other Ambulatory Visit (HOSPITAL_COMMUNITY): Payer: Self-pay | Admitting: Maternal and Fetal Medicine

## 2015-12-01 ENCOUNTER — Encounter (HOSPITAL_COMMUNITY): Payer: Self-pay | Admitting: *Deleted

## 2015-12-01 ENCOUNTER — Encounter (HOSPITAL_COMMUNITY): Payer: Self-pay

## 2015-12-01 VITALS — BP 127/82 | HR 92 | Wt 259.0 lb

## 2015-12-01 DIAGNOSIS — Z7982 Long term (current) use of aspirin: Secondary | ICD-10-CM | POA: Diagnosis not present

## 2015-12-01 DIAGNOSIS — Z3A34 34 weeks gestation of pregnancy: Secondary | ICD-10-CM | POA: Insufficient documentation

## 2015-12-01 DIAGNOSIS — O10913 Unspecified pre-existing hypertension complicating pregnancy, third trimester: Secondary | ICD-10-CM

## 2015-12-01 DIAGNOSIS — Z7984 Long term (current) use of oral hypoglycemic drugs: Secondary | ICD-10-CM | POA: Diagnosis not present

## 2015-12-01 DIAGNOSIS — O10013 Pre-existing essential hypertension complicating pregnancy, third trimester: Secondary | ICD-10-CM

## 2015-12-01 DIAGNOSIS — O99213 Obesity complicating pregnancy, third trimester: Secondary | ICD-10-CM | POA: Insufficient documentation

## 2015-12-01 DIAGNOSIS — O10919 Unspecified pre-existing hypertension complicating pregnancy, unspecified trimester: Secondary | ICD-10-CM

## 2015-12-01 DIAGNOSIS — O10019 Pre-existing essential hypertension complicating pregnancy, unspecified trimester: Secondary | ICD-10-CM

## 2015-12-01 DIAGNOSIS — Z7722 Contact with and (suspected) exposure to environmental tobacco smoke (acute) (chronic): Secondary | ICD-10-CM | POA: Diagnosis not present

## 2015-12-01 DIAGNOSIS — J45909 Unspecified asthma, uncomplicated: Secondary | ICD-10-CM | POA: Diagnosis not present

## 2015-12-01 DIAGNOSIS — O99513 Diseases of the respiratory system complicating pregnancy, third trimester: Secondary | ICD-10-CM | POA: Insufficient documentation

## 2015-12-01 DIAGNOSIS — O0993 Supervision of high risk pregnancy, unspecified, third trimester: Secondary | ICD-10-CM

## 2015-12-01 DIAGNOSIS — M1990 Unspecified osteoarthritis, unspecified site: Secondary | ICD-10-CM | POA: Diagnosis not present

## 2015-12-01 DIAGNOSIS — O24419 Gestational diabetes mellitus in pregnancy, unspecified control: Secondary | ICD-10-CM

## 2015-12-01 DIAGNOSIS — Z8742 Personal history of other diseases of the female genital tract: Secondary | ICD-10-CM

## 2015-12-01 DIAGNOSIS — O09529 Supervision of elderly multigravida, unspecified trimester: Secondary | ICD-10-CM

## 2015-12-01 DIAGNOSIS — O09523 Supervision of elderly multigravida, third trimester: Secondary | ICD-10-CM

## 2015-12-01 DIAGNOSIS — R768 Other specified abnormal immunological findings in serum: Secondary | ICD-10-CM

## 2015-12-01 DIAGNOSIS — O24415 Gestational diabetes mellitus in pregnancy, controlled by oral hypoglycemic drugs: Secondary | ICD-10-CM

## 2015-12-01 DIAGNOSIS — O368130 Decreased fetal movements, third trimester, not applicable or unspecified: Secondary | ICD-10-CM | POA: Diagnosis present

## 2015-12-01 DIAGNOSIS — O288 Other abnormal findings on antenatal screening of mother: Secondary | ICD-10-CM

## 2015-12-01 LAB — POCT URINALYSIS DIP (DEVICE)
GLUCOSE, UA: NEGATIVE mg/dL
Ketones, ur: NEGATIVE mg/dL
Nitrite: NEGATIVE
PH: 6 (ref 5.0–8.0)
PROTEIN: NEGATIVE mg/dL
UROBILINOGEN UA: 0.2 mg/dL (ref 0.0–1.0)

## 2015-12-01 NOTE — MAU Note (Signed)
Sent from Clinic for further EFM due to BP 6/8 and NR NST. Patient is gestational diabetic, AMA.

## 2015-12-01 NOTE — MAU Note (Signed)
Urine in lab 

## 2015-12-01 NOTE — MAU Provider Note (Signed)
Chief Complaint:  Non-stress Test   First Provider Initiated Contact with Patient 12/01/15 1231     HPI: Rita Lamb is a 40 y.o. [id number] at 54w2dwho was sent to maternity admissions from maternal fetal medicine for a BP 6/10. (Nonreactive, but reassuring NST and -2 for fetal movement). Achiness in antenatal testing for A2 gestational diabetes, chronic hypertension on labetalol, AMA. Has noticed decrease fetal movement today.  Has been a drinking normally today, but has not yet had lunch.  Associated signs and symptoms: Positive for mild contractions. Negative for leaking of fluid, vaginal bleeding.   Past Medical History: Past Medical History:  Diagnosis Date  . Arthritis    bilateral knee  . Asthma   . Bell's palsy   . Gestational diabetes   . Helicobacter pylori (H. pylori) infection   . Hypertension     Past obstetric history: OB History  Gravida Para Term Preterm AB Living  3 2 2     2   SAB TAB Ectopic Multiple Live Births               # Outcome Date GA Lbr Len/2nd Weight Sex Delivery Anes PTL Lv  3 Current           2 Term    7 lb 15 oz (3.6 kg) M Vag-Spont     1 Term    6 lb 3 oz (2.807 kg) F Vag-Spont         Past Surgical History: Past Surgical History:  Procedure Laterality Date  . CRYOTHERAPY  1996   cervix  . TONSILLECTOMY AND ADENOIDECTOMY       Family History: Family History  Problem Relation Age of Onset  . Diabetes Father   . Diabetes Sister   . Alcohol abuse Sister   . Colon cancer Paternal Grandfather   . Cancer Paternal Grandfather   . Stroke Paternal Grandfather   . Alcohol abuse Mother   . HIV Mother   . Diabetes Mother   . Dementia Maternal Grandmother   . Cancer Maternal Grandmother 553   colon,   . Cancer Maternal Grandfather   . Diabetes Maternal Grandfather   . Cancer Paternal Grandmother   . Heart disease Paternal Grandmother   . Diabetes Paternal Grandmother     Social History: Social History  Substance Use  Topics  . Smoking status: Passive Smoke Exposure - Never Smoker  . Smokeless tobacco: Never Used  . Alcohol use No    Allergies:  Allergies  Allergen Reactions  . Sulfa Antibiotics Rash    Meds:  Prescriptions Prior to Admission  Medication Sig Dispense Refill Last Dose  . acetaminophen (TYLENOL) 500 MG tablet Take 1-2 tabs every 6 hours for pain. 30 tablet 0 Taking  . albuterol (PROVENTIL HFA;VENTOLIN HFA) 108 (90 BASE) MCG/ACT inhaler Inhale 2 puffs into the lungs every 6 (six) hours as needed for wheezing or shortness of breath. Reported on 08/17/2015   Taking  . ASPIRIN LOW DOSE 81 MG chewable tablet CHEW AND SWALLOW 1 TABLET(81 MG) BY MOUTH DAILY 30 tablet 0 Taking  . BAYER MICROLET LANCETS lancets Lancets compatible with Bayer Contour Monitor for testing 4 times daily DX GDM O24.419 100 each 12 Taking  . Blood Glucose Monitoring Suppl (BAYER CONTOUR MONITOR) w/Device KIT 1 each by Does not apply route 4 (four) times daily. For testing four times per day DX GDM O24.419 1 each 0 Taking  . glucose blood (BAYER CONTOUR TEST) test strip For  testing 4 times daily. New DX GDM O24.419 100 each 12 Taking  . glyBURIDE (DIABETA) 5 MG tablet Take 2 tablets (10 mg total) twice daily with a meal (breakfast and dinner) 60 tablet 2 Taking  . hydrochlorothiazide (HYDRODIURIL) 25 MG tablet Take 1 tablet (25 mg total) by mouth daily. 30 tablet 3 Taking  . labetalol (NORMODYNE) 200 MG tablet Take 1 tablet (200 mg total) by mouth 2 (two) times daily. 120 tablet 3 Taking  . metFORMIN (GLUCOPHAGE) 1000 MG tablet Take 1 tablet (1,000 mg total) by mouth 2 (two) times daily with a meal. 1 tablet 2 Taking  . Prenatal Vit-Min-FA-Fish Oil ([employer] PRENATAL GUMMY PO) Take 1 tablet by mouth daily.    Taking    I have reviewed patient's Past Medical Hx, Surgical Hx, Family Hx, Social Hx, medications and allergies.   ROS:  Review of Systems  Physical Exam   Patient Vitals for the past 24 hrs:  BP Temp Temp src  Pulse Resp  12/01/15 1214 137/83 98 F (36.7 C) Oral 86 18   Constitutional: Well-developed, well-nourished, Obese female in no acute distress.  Cardiovascular: normal rate Respiratory: normal effort GI: Gravid, size greater than dates. Neurologic: Alert and oriented x 4.  GU: Deferred  FHT:  Baseline 145 , moderate variability, accelerations present, 15x15 decelerations Contractions: Irregular, mild   Labs: No results found for this or any previous visit (from the past 24 hour(s)).  Imaging:     MAU Course: Prolonged EFM. Lunch ordered.  Reviewed by Dr. Gala Romney. Reactive. Patient now feeling normal fetal movement.  MDM: - BPP now 8/10. Overall reassuring fetal status.   Assessment: 1. NST (non-stress test) nonreactive   2. GDM, class A2   3. Chronic hypertension in pregnancy, third trimester    Plan: Discharge home in stable condition per consult w/ Dr. Gala Romney.  Preterm labor precautions and fetal kick counts Follow-up [place] for [place] Follow up on 12/01/2015.   Specialty:  Obstetrics and Gynecology Why:  Will call you to schedule nonstress test 7/27 or 7/28. Contact information: [place] .   Why:  As needed if symptoms worsen Contact information: [address] [id number] [place] [phone]            Medication List    TAKE these medications   acetaminophen 500 MG tablet Commonly known as:  TYLENOL Take 1-2 tabs every 6 hours for pain.   albuterol 108 (90 Base) MCG/ACT inhaler Commonly known as:  PROVENTIL HFA;VENTOLIN HFA Inhale 2 puffs into the lungs every 6 (six) hours as needed for wheezing or shortness of breath. Reported on 08/17/2015   ASPIRIN LOW DOSE 81 MG chewable tablet Generic drug:  aspirin CHEW AND SWALLOW 1 TABLET(81 MG) BY MOUTH DAILY     BAYER CONTOUR MONITOR w/Device Kit 1 each by Does not apply route 4 (four) times daily. For testing four times per day DX GDM O24.419   BAYER MICROLET LANCETS lancets Lancets compatible with Bayer Contour Monitor for testing 4 times daily DX GDM O24.419   [employer] PRENATAL GUMMY PO Take 1 tablet by mouth daily.   glucose blood test strip Commonly known as:  BAYER CONTOUR TEST For testing 4 times daily. New DX GDM O24.419   glyBURIDE 5 MG tablet Commonly known as:  DIABETA Take 2 tablets (10 mg total)  twice daily with a meal (breakfast and dinner)   hydrochlorothiazide 25 MG tablet Commonly known as:  HYDRODIURIL Take 1 tablet (25 mg total) by mouth daily.   labetalol 200 MG tablet Commonly known as:  NORMODYNE Take 1 tablet (200 mg total) by mouth 2 (two) times daily.   metFORMIN 1000 MG tablet Commonly known as:  GLUCOPHAGE Take 1 tablet (1,000 mg total) by mouth 2 (two) times daily with a meal.       Manya Silvas, CNM 12/01/2015 1:52 PM

## 2015-12-01 NOTE — Discharge Instructions (Signed)
Fetal Movement Counts  Patient Name: __________________________________________________ Patient Due Date: ____________________  Performing a fetal movement count is highly recommended in high-risk pregnancies, but it is good for every pregnant woman to do. Your health care provider may ask you to start counting fetal movements at 28 weeks of the pregnancy. Fetal movements often increase:  · After eating a full meal.  · After physical activity.  · After eating or drinking something sweet or cold.  · At rest.  Pay attention to when you feel the baby is most active. This will help you notice a pattern of your baby's sleep and wake cycles and what factors contribute to an increase in fetal movement. It is important to perform a fetal movement count at the same time each day when your baby is normally most active.   HOW TO COUNT FETAL MOVEMENTS  1. Find a quiet and comfortable area to sit or lie down on your left side. Lying on your left side provides the best blood and oxygen circulation to your baby.  2. Write down the day and time on a sheet of paper or in a journal.  3. Start counting kicks, flutters, swishes, rolls, or jabs in a 2-hour period. You should feel at least 10 movements within 2 hours.  4. If you do not feel 10 movements in 2 hours, wait 2-3 hours and count again. Look for a change in the pattern or not enough counts in 2 hours.  SEEK MEDICAL CARE IF:  · You feel less than 10 counts in 2 hours, tried twice.  · There is no movement in over an hour.  · The pattern is changing or taking longer each day to reach 10 counts in 2 hours.  · You feel the baby is not moving as he or she usually does.  Date: ____________ Movements: ____________ Start time: ____________ Finish time: ____________   Date: ____________ Movements: ____________ Start time: ____________ Finish time: ____________  Date: ____________ Movements: ____________ Start time: ____________ Finish time: ____________  Date: ____________ Movements:  ____________ Start time: ____________ Finish time: ____________  Date: ____________ Movements: ____________ Start time: ____________ Finish time: ____________  Date: ____________ Movements: ____________ Start time: ____________ Finish time: ____________  Date: ____________ Movements: ____________ Start time: ____________ Finish time: ____________  Date: ____________ Movements: ____________ Start time: ____________ Finish time: ____________   Date: ____________ Movements: ____________ Start time: ____________ Finish time: ____________  Date: ____________ Movements: ____________ Start time: ____________ Finish time: ____________  Date: ____________ Movements: ____________ Start time: ____________ Finish time: ____________  Date: ____________ Movements: ____________ Start time: ____________ Finish time: ____________  Date: ____________ Movements: ____________ Start time: ____________ Finish time: ____________  Date: ____________ Movements: ____________ Start time: ____________ Finish time: ____________  Date: ____________ Movements: ____________ Start time: ____________ Finish time: ____________   Date: ____________ Movements: ____________ Start time: ____________ Finish time: ____________  Date: ____________ Movements: ____________ Start time: ____________ Finish time: ____________  Date: ____________ Movements: ____________ Start time: ____________ Finish time: ____________  Date: ____________ Movements: ____________ Start time: ____________ Finish time: ____________  Date: ____________ Movements: ____________ Start time: ____________ Finish time: ____________  Date: ____________ Movements: ____________ Start time: ____________ Finish time: ____________  Date: ____________ Movements: ____________ Start time: ____________ Finish time: ____________   Date: ____________ Movements: ____________ Start time: ____________ Finish time: ____________  Date: ____________ Movements: ____________ Start time: ____________ Finish  time: ____________  Date: ____________ Movements: ____________ Start time: ____________ Finish time: ____________  Date: ____________ Movements: ____________ Start time:   ____________ Finish time: ____________  Date: ____________ Movements: ____________ Start time: ____________ Finish time: ____________  Date: ____________ Movements: ____________ Start time: ____________ Finish time: ____________  Date: ____________ Movements: ____________ Start time: ____________ Finish time: ____________   Date: ____________ Movements: ____________ Start time: ____________ Finish time: ____________  Date: ____________ Movements: ____________ Start time: ____________ Finish time: ____________  Date: ____________ Movements: ____________ Start time: ____________ Finish time: ____________  Date: ____________ Movements: ____________ Start time: ____________ Finish time: ____________  Date: ____________ Movements: ____________ Start time: ____________ Finish time: ____________  Date: ____________ Movements: ____________ Start time: ____________ Finish time: ____________  Date: ____________ Movements: ____________ Start time: ____________ Finish time: ____________   Date: ____________ Movements: ____________ Start time: ____________ Finish time: ____________  Date: ____________ Movements: ____________ Start time: ____________ Finish time: ____________  Date: ____________ Movements: ____________ Start time: ____________ Finish time: ____________  Date: ____________ Movements: ____________ Start time: ____________ Finish time: ____________  Date: ____________ Movements: ____________ Start time: ____________ Finish time: ____________  Date: ____________ Movements: ____________ Start time: ____________ Finish time: ____________  Date: ____________ Movements: ____________ Start time: ____________ Finish time: ____________   Date: ____________ Movements: ____________ Start time: ____________ Finish time: ____________  Date: ____________  Movements: ____________ Start time: ____________ Finish time: ____________  Date: ____________ Movements: ____________ Start time: ____________ Finish time: ____________  Date: ____________ Movements: ____________ Start time: ____________ Finish time: ____________  Date: ____________ Movements: ____________ Start time: ____________ Finish time: ____________  Date: ____________ Movements: ____________ Start time: ____________ Finish time: ____________  Date: ____________ Movements: ____________ Start time: ____________ Finish time: ____________   Date: ____________ Movements: ____________ Start time: ____________ Finish time: ____________  Date: ____________ Movements: ____________ Start time: ____________ Finish time: ____________  Date: ____________ Movements: ____________ Start time: ____________ Finish time: ____________  Date: ____________ Movements: ____________ Start time: ____________ Finish time: ____________  Date: ____________ Movements: ____________ Start time: ____________ Finish time: ____________  Date: ____________ Movements: ____________ Start time: ____________ Finish time: ____________     This information is not intended to replace advice given to you by your health care provider. Make sure you discuss any questions you have with your health care provider.     Document Released: 05/25/2006 Document Revised: 05/16/2014 Document Reviewed: 02/20/2012  Elsevier Interactive Patient Education ©2016 [employer]

## 2015-12-02 ENCOUNTER — Telehealth: Payer: Self-pay | Admitting: General Practice

## 2015-12-02 NOTE — Telephone Encounter (Signed)
Patient called into front office stating she got sick this morning after taking her medication and doesn't know what she can eat so she has stopped eating. Reviewed BRAT diet with patient. Patient verbalized understanding & asked when she needs to come back in for the nst. Per chart review, patient needs to return Thursday or Friday of this week. Informed patient & transferred call back to [facility] to schedule. Patient had no other questions

## 2015-12-03 ENCOUNTER — Ambulatory Visit (INDEPENDENT_AMBULATORY_CARE_PROVIDER_SITE_OTHER): Payer: BLUE CROSS/BLUE SHIELD | Admitting: Obstetrics & Gynecology

## 2015-12-03 VITALS — BP 129/78 | HR 91

## 2015-12-03 DIAGNOSIS — O10013 Pre-existing essential hypertension complicating pregnancy, third trimester: Secondary | ICD-10-CM

## 2015-12-03 DIAGNOSIS — O09523 Supervision of elderly multigravida, third trimester: Secondary | ICD-10-CM

## 2015-12-03 DIAGNOSIS — O24419 Gestational diabetes mellitus in pregnancy, unspecified control: Secondary | ICD-10-CM

## 2015-12-07 ENCOUNTER — Ambulatory Visit (INDEPENDENT_AMBULATORY_CARE_PROVIDER_SITE_OTHER): Payer: BLUE CROSS/BLUE SHIELD | Admitting: Obstetrics and Gynecology

## 2015-12-07 ENCOUNTER — Encounter: Payer: Self-pay | Admitting: Obstetrics and Gynecology

## 2015-12-07 VITALS — BP 138/91 | HR 92 | Wt 261.8 lb

## 2015-12-07 DIAGNOSIS — O24419 Gestational diabetes mellitus in pregnancy, unspecified control: Secondary | ICD-10-CM

## 2015-12-07 DIAGNOSIS — O0993 Supervision of high risk pregnancy, unspecified, third trimester: Secondary | ICD-10-CM

## 2015-12-07 DIAGNOSIS — O10013 Pre-existing essential hypertension complicating pregnancy, third trimester: Secondary | ICD-10-CM | POA: Diagnosis not present

## 2015-12-07 DIAGNOSIS — O09523 Supervision of elderly multigravida, third trimester: Secondary | ICD-10-CM

## 2015-12-07 DIAGNOSIS — E669 Obesity, unspecified: Secondary | ICD-10-CM

## 2015-12-07 DIAGNOSIS — O99213 Obesity complicating pregnancy, third trimester: Secondary | ICD-10-CM

## 2015-12-07 DIAGNOSIS — R768 Other specified abnormal immunological findings in serum: Secondary | ICD-10-CM

## 2015-12-07 LAB — POCT URINALYSIS DIP (DEVICE)
GLUCOSE, UA: NEGATIVE mg/dL
Nitrite: NEGATIVE
Protein, ur: 30 mg/dL — AB
Urobilinogen, UA: 1 mg/dL (ref 0.0–1.0)
pH: 5.5 (ref 5.0–8.0)

## 2015-12-07 NOTE — Progress Notes (Signed)
Moderate leukocytes on udip today.

## 2015-12-07 NOTE — Progress Notes (Signed)
Prenatal Visit Note Date: 12/07/2015 Clinic: [facility]  Subjective:  Rita Lamb is a 40 y.o. [id number] at [redacted]w[redacted]d being seen today for ongoing prenatal care.  She is currently monitored for the following issues for this high-risk pregnancy and has Asthma, intermittent; Hypertension; Obesity-class 3/Morbid; History of gestational diabetes in prior pregnancy, currently pregnant; Astigmatism of left eye; Impaired glucose tolerance-last A1c 5.3; Hirsutism; Hyperlipidemia LDL goal < 100; Irregular periods; Hypertension in pregnancy, essential, antepartum; Supervision of high risk pregnancy, antepartum; Biological false positive RPR test; History of abnormal cervical Pap smear; Gestational diabetes mellitus, antepartum; Obesity in pregnancy, antepartum; and Advanced maternal age in multigravida on her problem list.  Patient reports no complaints.   Contractions: Irregular. Vag. Bleeding: None.  Movement: Present. Denies leaking of fluid.   The following portions of the patient's history were reviewed and updated as appropriate: allergies, current medications, past family history, past medical history, past social history, past surgical history and problem list. Problem list updated.  Objective:   Vitals:   12/07/15 0840  BP: (!) 138/91  Pulse: 92  Weight: 261 lb 12.8 oz (118.8 kg)    Fetal Status: Fetal Heart Rate (bpm): NST   Movement: Present  Presentation: Vertex  General:  Alert, oriented and cooperative. Patient is in no acute distress.  Skin: Skin is warm and dry. No rash noted.   Cardiovascular: Normal heart rate noted  Respiratory: Normal respiratory effort, no problems with respiration noted  Abdomen: Soft, gravid, appropriate for gestational age. Pain/Pressure: Present     Pelvic:  Cervical exam deferred        Extremities: Normal range of motion.  Edema: None  Mental Status: Normal mood and affect. Normal behavior. Normal judgment and thought content.    Urinalysis: Urine Protein: 1+ Urine Glucose: Negative  Assessment and Plan:  Pregnancy: G3P2002 at [redacted]w[redacted]d  1. Hypertension in pregnancy, essential, antepartum, third trimester CHTN. Continue with HCTZ 25 qday and labetalol 200 bid, 2x/week testing. rNST and normal AFI, cephalic today.  7/17 growth normal @ 68% with AC at 90 and normal AFI F/u repeat growth for 8/14 with MFM 39wk delivery unless non reassuring testing - Amniotic fluid index with NST  2. Advanced maternal age in multigravida, third trimester S/p genetics consult already (no testing desired)  3. Gestational diabetes mellitus, antepartum GDMA2. On glyburide 10/10 with breakfast and dinner and was increased to metformin 1000/1000 last week but unable to take due to GI issues, so pt put self back on 500/500 with meals. BS log showed normal 2hr PPs (a few in the 120s) and AM fastings that were decreasing with the last two at 108 and 97. Pt told to continue with 500/500 along with glyburide and would okay with high 90s/100s am fastings. See above for AP testing - Amniotic fluid index with NST  4. Supervision of high risk pregnancy, antepartum, third trimester Desires BTL. Pt aware that likely will have to be an interval procedure  5. Obesity in pregnancy, antepartum, third trimester See above.   6. Biological false positive RPR test With negative TPA testing. 1:2 titers x 2 during pregnancy. Follow up admit and PP rpr   Preterm labor symptoms and general obstetric precautions including but not limited to vaginal bleeding, contractions, leaking of fluid and fetal movement were reviewed in detail with the patient. Please refer to After Visit Summary for other counseling recommendations.  Return in about 7 days (around 12/14/2015) for Ob fu and NST/AFI.   Aletha Halim,  MD   

## 2015-12-11 ENCOUNTER — Ambulatory Visit (INDEPENDENT_AMBULATORY_CARE_PROVIDER_SITE_OTHER): Payer: BLUE CROSS/BLUE SHIELD | Admitting: Obstetrics & Gynecology

## 2015-12-11 VITALS — BP 141/79

## 2015-12-11 DIAGNOSIS — I10 Essential (primary) hypertension: Secondary | ICD-10-CM

## 2015-12-11 DIAGNOSIS — O10913 Unspecified pre-existing hypertension complicating pregnancy, third trimester: Secondary | ICD-10-CM

## 2015-12-11 DIAGNOSIS — O0993 Supervision of high risk pregnancy, unspecified, third trimester: Secondary | ICD-10-CM

## 2015-12-11 DIAGNOSIS — O09523 Supervision of elderly multigravida, third trimester: Secondary | ICD-10-CM

## 2015-12-11 DIAGNOSIS — R7302 Impaired glucose tolerance (oral): Secondary | ICD-10-CM

## 2015-12-11 DIAGNOSIS — E669 Obesity, unspecified: Secondary | ICD-10-CM

## 2015-12-11 LAB — COMPREHENSIVE METABOLIC PANEL
ALT: 16 U/L (ref 6–29)
AST: 15 U/L (ref 10–30)
Albumin: 3.2 g/dL — ABNORMAL LOW (ref 3.6–5.1)
Alkaline Phosphatase: 110 U/L (ref 33–115)
BILIRUBIN TOTAL: 0.4 mg/dL (ref 0.2–1.2)
BUN: 9 mg/dL (ref 7–25)
CO2: 20 mmol/L (ref 20–31)
CREATININE: 0.58 mg/dL (ref 0.50–1.10)
Calcium: 8.8 mg/dL (ref 8.6–10.2)
Chloride: 102 mmol/L (ref 98–110)
GLUCOSE: 82 mg/dL (ref 65–99)
Potassium: 4.4 mmol/L (ref 3.5–5.3)
SODIUM: 134 mmol/L — AB (ref 135–146)
Total Protein: 6.3 g/dL (ref 6.1–8.1)

## 2015-12-11 LAB — CBC
HCT: 34.2 % — ABNORMAL LOW (ref 35.0–45.0)
Hemoglobin: 10.9 g/dL — ABNORMAL LOW (ref 11.7–15.5)
MCH: 27.9 pg (ref 27.0–33.0)
MCHC: 31.9 g/dL — AB (ref 32.0–36.0)
MCV: 87.5 fL (ref 80.0–100.0)
MPV: 11.4 fL (ref 7.5–12.5)
PLATELETS: 311 10*3/uL (ref 140–400)
RBC: 3.91 MIL/uL (ref 3.80–5.10)
RDW: 15.5 % — AB (ref 11.0–15.0)
WBC: 10.9 10*3/uL — AB (ref 3.8–10.8)

## 2015-12-11 NOTE — Progress Notes (Signed)
Subjective:  Rita Lamb is a 40 y.o. G3P2002 at [redacted]w[redacted]d being seen today for ongoing prenatal care.  She is currently monitored for the following issues for this high-risk pregnancy and has Asthma, intermittent; Obesity-class 3/Morbid; History of gestational diabetes in prior pregnancy, currently pregnant; Astigmatism of left eye; Impaired glucose tolerance-last A1c 5.3; Hirsutism; Hyperlipidemia LDL goal < 100; Irregular periods; Chronic hypertension complicating or reason for care during pregnancy; Supervision of high risk pregnancy, antepartum; Biological false positive RPR test; History of abnormal cervical Pap smear; GDM, class A2; Obesity in pregnancy, antepartum; and Advanced maternal age in multigravida on her problem list.  Patient reports contractions since last week.  Contractions: Irregular.  .  Movement: Present. Denies leaking of fluid.   The following portions of the patient's history were reviewed and updated as appropriate: allergies, current medications, past family history, past medical history, past social history, past surgical history and problem list. Problem list updated.  Objective:   Vitals:   12/11/15 0829  BP: (!) 141/79    Fetal Status:     Movement: Present     General:  Alert, oriented and cooperative. Patient is in no acute distress.  Skin: Skin is warm and dry. No rash noted.   Cardiovascular: Normal heart rate noted  Respiratory: Normal respiratory effort, no problems with respiration noted  Abdomen: Soft, gravid, appropriate for gestational age.       Pelvic:  Cervical exam performed        Extremities: Normal range of motion.     Mental Status: Normal mood and affect. Normal behavior. Normal judgment and thought content.   Urinalysis:      Assessment and Plan:  Pregnancy: G3P2002 at [redacted]w[redacted]d  1. Chronic hypertension complicating or reason for care during pregnancy, third trimester -She has not taken her labetolol today yet and the BP cuff was used  during a contraction per the patient. She denies symptoms of pre eclampsia. - Fetal nonstress test - CBC - Comprehensive metabolic panel - Protein / creatinine ratio, urine  2. Advanced maternal age in multigravida, third trimester   3. Impaired glucose tolerance-last A1c 5.3   4. Obesity-class 3/Morbid   5. Supervision of high risk pregnancy, antepartum, third trimester - Cervical cultures at next visit  Preterm labor symptoms and general obstetric precautions including but not limited to vaginal bleeding, contractions, leaking of fluid and fetal movement were reviewed in detail with the patient. Please refer to After Visit Summary for other counseling recommendations.  No Follow-up on file.   Emily Filbert, MD

## 2015-12-14 ENCOUNTER — Ambulatory Visit (INDEPENDENT_AMBULATORY_CARE_PROVIDER_SITE_OTHER): Payer: BLUE CROSS/BLUE SHIELD | Admitting: Obstetrics & Gynecology

## 2015-12-14 VITALS — BP 126/89 | HR 93 | Wt 262.2 lb

## 2015-12-14 DIAGNOSIS — O24419 Gestational diabetes mellitus in pregnancy, unspecified control: Secondary | ICD-10-CM

## 2015-12-14 DIAGNOSIS — Z113 Encounter for screening for infections with a predominantly sexual mode of transmission: Secondary | ICD-10-CM

## 2015-12-14 DIAGNOSIS — Z36 Encounter for antenatal screening of mother: Secondary | ICD-10-CM

## 2015-12-14 DIAGNOSIS — I10 Essential (primary) hypertension: Secondary | ICD-10-CM

## 2015-12-14 DIAGNOSIS — Z124 Encounter for screening for malignant neoplasm of cervix: Secondary | ICD-10-CM

## 2015-12-14 DIAGNOSIS — O09523 Supervision of elderly multigravida, third trimester: Secondary | ICD-10-CM

## 2015-12-14 DIAGNOSIS — O0993 Supervision of high risk pregnancy, unspecified, third trimester: Secondary | ICD-10-CM

## 2015-12-14 DIAGNOSIS — O10913 Unspecified pre-existing hypertension complicating pregnancy, third trimester: Secondary | ICD-10-CM | POA: Diagnosis not present

## 2015-12-14 LAB — POCT URINALYSIS DIP (DEVICE)
Bilirubin Urine: NEGATIVE
Glucose, UA: NEGATIVE mg/dL
HGB URINE DIPSTICK: NEGATIVE
Ketones, ur: NEGATIVE mg/dL
Nitrite: NEGATIVE
Protein, ur: 30 mg/dL — AB
Urobilinogen, UA: 0.2 mg/dL (ref 0.0–1.0)
pH: 5.5 (ref 5.0–8.0)

## 2015-12-14 LAB — OB RESULTS CONSOLE GBS: GBS: POSITIVE

## 2015-12-14 LAB — OB RESULTS CONSOLE GC/CHLAMYDIA: Gonorrhea: NEGATIVE

## 2015-12-14 NOTE — Patient Instructions (Signed)
Return to clinic for any scheduled appointments or obstetric concerns, or go to [facility] for evaluation  

## 2015-12-14 NOTE — Progress Notes (Signed)
Subjective:  Rita Lamb is a 40 y.o. G3P2002 at [redacted]w[redacted]d being seen today for ongoing prenatal care.  She is currently monitored for the following issues for this high-risk pregnancy and has Asthma, intermittent; Obesity-class 3/Morbid; History of gestational diabetes in prior pregnancy, currently pregnant; Astigmatism of left eye; Impaired glucose tolerance-last A1c 5.3; Hirsutism; Hyperlipidemia LDL goal < 100; Irregular periods; Chronic hypertension complicating or reason for care during pregnancy; Supervision of high risk pregnancy, antepartum; Biological false positive RPR test; History of abnormal cervical Pap smear; GDM, class A2; Obesity in pregnancy, antepartum; and Advanced maternal age in multigravida on her problem list.  Patient reports occasional contractions.  Contractions: Irregular. Vag. Bleeding: None.  Movement: Present. Denies leaking of fluid.   The following portions of the patient's history were reviewed and updated as appropriate: allergies, current medications, past family history, past medical history, past social history, past surgical history and problem list. Problem list updated.  Objective:   Vitals:   12/14/15 0821  BP: 126/89  Pulse: 93  Weight: 262 lb 3.2 oz (118.9 kg)    Fetal Status: Fetal Heart Rate (bpm): NST Fundal Height: 42 cm Movement: Present  Presentation: Vertex  General:  Alert, oriented and cooperative. Patient is in no acute distress.  Skin: Skin is warm and dry. No rash noted.   Cardiovascular: Normal heart rate noted  Respiratory: Normal respiratory effort, no problems with respiration noted  Abdomen: Soft, gravid, appropriate for gestational age. Pain/Pressure: Present     Pelvic:  Cervical exam performed Dilation: 2 Effacement (%): 50 Station: -3  Extremities: Normal range of motion.  Edema: None  Mental Status: Normal mood and affect. Normal behavior. Normal judgment and thought content.   Urinalysis: Urine Protein: 1+ Urine Glucose:  Negative  CBGs within range NST performed today was reviewed and was found to be reactive.  AFI was also normal.    Assessment and Plan:  Pregnancy: G3P2002 at [redacted]w[redacted]d  1. Chronic hypertension complicating or reason for care during pregnancy, third trimester Normal BP. Continue Labetalol, discontinue ASA.  Preeclampsia precautions advised. Continue recommended antenatal testing and prenatal care.  2. Advanced maternal age in multigravida, third trimester Continue recommended antenatal testing and prenatal care.  3. Gestational diabetes mellitus, antepartum Continue Glyburide and Metformin. Continue recommended antenatal testing and prenatal care.  4. Supervision of high risk pregnancy, antepartum, third trimester - Culture, beta strep (group b only) - GC/Chlamydia probe amp ([place])not at [facility] Preterm labor symptoms and general obstetric precautions including but not limited to vaginal bleeding, contractions, leaking of fluid and fetal movement were reviewed in detail with the patient. Please refer to After Visit Summary for other counseling recommendations.  Return in about 3 days (around 12/17/2015) for NST only.   Osborne Oman, MD

## 2015-12-16 LAB — GC/CHLAMYDIA PROBE AMP (~~LOC~~) NOT AT ARMC
CHLAMYDIA, DNA PROBE: NEGATIVE
NEISSERIA GONORRHEA: NEGATIVE

## 2015-12-16 LAB — CULTURE, BETA STREP (GROUP B ONLY)

## 2015-12-17 ENCOUNTER — Ambulatory Visit (INDEPENDENT_AMBULATORY_CARE_PROVIDER_SITE_OTHER): Payer: BLUE CROSS/BLUE SHIELD | Admitting: Obstetrics & Gynecology

## 2015-12-17 VITALS — BP 147/79 | HR 86

## 2015-12-17 DIAGNOSIS — O10913 Unspecified pre-existing hypertension complicating pregnancy, third trimester: Secondary | ICD-10-CM | POA: Diagnosis not present

## 2015-12-17 DIAGNOSIS — O24419 Gestational diabetes mellitus in pregnancy, unspecified control: Secondary | ICD-10-CM

## 2015-12-17 DIAGNOSIS — I10 Essential (primary) hypertension: Secondary | ICD-10-CM

## 2015-12-17 DIAGNOSIS — O09523 Supervision of elderly multigravida, third trimester: Secondary | ICD-10-CM

## 2015-12-17 NOTE — Progress Notes (Signed)
NST reactive on 12/17/15

## 2015-12-17 NOTE — Progress Notes (Signed)
Pt states she has not taken her Labetalol yet today.  She denies H/A and visual disturbances. She agrees to return to hospital if those sx occur.

## 2015-12-18 ENCOUNTER — Encounter: Payer: Self-pay | Admitting: Obstetrics & Gynecology

## 2015-12-18 DIAGNOSIS — O99820 Streptococcus B carrier state complicating pregnancy: Secondary | ICD-10-CM | POA: Insufficient documentation

## 2015-12-20 ENCOUNTER — Inpatient Hospital Stay (HOSPITAL_COMMUNITY): Payer: BLUE CROSS/BLUE SHIELD | Admitting: Anesthesiology

## 2015-12-20 ENCOUNTER — Inpatient Hospital Stay (HOSPITAL_COMMUNITY)
Admission: AD | Admit: 2015-12-20 | Discharge: 2015-12-23 | DRG: 774 | Disposition: A | Discharge status: Home / Self Care | Payer: BLUE CROSS/BLUE SHIELD | Source: Ambulatory Visit | Attending: Obstetrics & Gynecology | Admitting: Obstetrics & Gynecology

## 2015-12-20 ENCOUNTER — Encounter (HOSPITAL_COMMUNITY): Payer: Self-pay | Admitting: Certified Nurse Midwife

## 2015-12-20 DIAGNOSIS — O99824 Streptococcus B carrier state complicating childbirth: Secondary | ICD-10-CM | POA: Diagnosis present

## 2015-12-20 DIAGNOSIS — O119 Pre-existing hypertension with pre-eclampsia, unspecified trimester: Secondary | ICD-10-CM | POA: Diagnosis present

## 2015-12-20 DIAGNOSIS — O99820 Streptococcus B carrier state complicating pregnancy: Secondary | ICD-10-CM

## 2015-12-20 DIAGNOSIS — O99214 Obesity complicating childbirth: Secondary | ICD-10-CM | POA: Diagnosis present

## 2015-12-20 DIAGNOSIS — O1002 Pre-existing essential hypertension complicating childbirth: Principal | ICD-10-CM | POA: Diagnosis present

## 2015-12-20 DIAGNOSIS — O24425 Gestational diabetes mellitus in childbirth, controlled by oral hypoglycemic drugs: Secondary | ICD-10-CM | POA: Diagnosis present

## 2015-12-20 DIAGNOSIS — O1414 Severe pre-eclampsia complicating childbirth: Secondary | ICD-10-CM | POA: Diagnosis not present

## 2015-12-20 DIAGNOSIS — O10919 Unspecified pre-existing hypertension complicating pregnancy, unspecified trimester: Secondary | ICD-10-CM | POA: Diagnosis present

## 2015-12-20 DIAGNOSIS — Z8249 Family history of ischemic heart disease and other diseases of the circulatory system: Secondary | ICD-10-CM

## 2015-12-20 DIAGNOSIS — Z3483 Encounter for supervision of other normal pregnancy, third trimester: Secondary | ICD-10-CM | POA: Diagnosis present

## 2015-12-20 DIAGNOSIS — O3663X0 Maternal care for excessive fetal growth, third trimester, not applicable or unspecified: Secondary | ICD-10-CM | POA: Diagnosis present

## 2015-12-20 DIAGNOSIS — Z823 Family history of stroke: Secondary | ICD-10-CM | POA: Diagnosis not present

## 2015-12-20 DIAGNOSIS — Z833 Family history of diabetes mellitus: Secondary | ICD-10-CM | POA: Diagnosis not present

## 2015-12-20 DIAGNOSIS — Z3A37 37 weeks gestation of pregnancy: Secondary | ICD-10-CM | POA: Diagnosis not present

## 2015-12-20 DIAGNOSIS — O114 Pre-existing hypertension with pre-eclampsia, complicating childbirth: Secondary | ICD-10-CM | POA: Diagnosis present

## 2015-12-20 DIAGNOSIS — Z6841 Body Mass Index (BMI) 40.0 and over, adult: Secondary | ICD-10-CM | POA: Diagnosis not present

## 2015-12-20 DIAGNOSIS — O09529 Supervision of elderly multigravida, unspecified trimester: Secondary | ICD-10-CM

## 2015-12-20 DIAGNOSIS — O10019 Pre-existing essential hypertension complicating pregnancy, unspecified trimester: Secondary | ICD-10-CM | POA: Diagnosis present

## 2015-12-20 DIAGNOSIS — O0990 Supervision of high risk pregnancy, unspecified, unspecified trimester: Secondary | ICD-10-CM

## 2015-12-20 DIAGNOSIS — O24419 Gestational diabetes mellitus in pregnancy, unspecified control: Secondary | ICD-10-CM

## 2015-12-20 LAB — COMPREHENSIVE METABOLIC PANEL
ALBUMIN: 2.8 g/dL — AB (ref 3.5–5.0)
ALT: 18 U/L (ref 14–54)
AST: 18 U/L (ref 15–41)
Alkaline Phosphatase: 117 U/L (ref 38–126)
Anion gap: 5 (ref 5–15)
BUN: 7 mg/dL (ref 6–20)
CO2: 21 mmol/L — AB (ref 22–32)
CREATININE: 0.52 mg/dL (ref 0.44–1.00)
Calcium: 8.4 mg/dL — ABNORMAL LOW (ref 8.9–10.3)
Chloride: 107 mmol/L (ref 101–111)
GFR calc Af Amer: >60 mL/min (ref >60)
GFR calc non Af Amer: >60 mL/min (ref >60)
Glucose, Bld: 158 mg/dL — ABNORMAL HIGH (ref 65–99)
Potassium: 4.1 mmol/L (ref 3.5–5.1)
SODIUM: 133 mmol/L — AB (ref 135–145)
Total Bilirubin: 0.5 mg/dL (ref 0.3–1.2)
Total Protein: 6.3 g/dL — ABNORMAL LOW (ref 6.5–8.1)

## 2015-12-20 LAB — URINALYSIS W MICROSCOPIC (NOT AT ARMC)
Bilirubin Urine: NEGATIVE
GLUCOSE, UA: 500 mg/dL — AB
Ketones, ur: NEGATIVE mg/dL
Nitrite: NEGATIVE
PH: 6 (ref 5.0–8.0)
PROTEIN: NEGATIVE mg/dL
Specific Gravity, Urine: 1.02 (ref 1.005–1.030)

## 2015-12-20 LAB — PROTEIN / CREATININE RATIO, URINE
Creatinine, Urine: 104 mg/dL
PROTEIN CREATININE RATIO: 0.31 mg/mg{creat} — AB (ref 0.00–0.15)
TOTAL PROTEIN, URINE: 32 mg/dL

## 2015-12-20 LAB — CBC
HCT: 32.1 % — ABNORMAL LOW (ref 36.0–46.0)
Hemoglobin: 10.3 g/dL — ABNORMAL LOW (ref 12.0–15.0)
MCH: 28.1 pg (ref 26.0–34.0)
MCHC: 32.1 g/dL (ref 30.0–36.0)
MCV: 87.7 fL (ref 78.0–100.0)
PLATELETS: 284 10*3/uL (ref 150–400)
RBC: 3.66 MIL/uL — ABNORMAL LOW (ref 3.87–5.11)
RDW: 16 % — AB (ref 11.5–15.5)
WBC: 10.5 10*3/uL (ref 4.0–10.5)

## 2015-12-20 LAB — TYPE AND SCREEN
ABO/RH(D): A POS
Antibody Screen: NEGATIVE

## 2015-12-20 MED ORDER — DIPHENHYDRAMINE HCL 50 MG/ML IJ SOLN: 12.5000 mg | INTRAMUSCULAR | Status: DC | PRN

## 2015-12-20 MED ORDER — PHENYLEPHRINE 40 MCG/ML (10ML) SYRINGE FOR IV PUSH (FOR BLOOD PRESSURE SUPPORT)
80.0000 ug | PREFILLED_SYRINGE | INTRAVENOUS | Status: DC | PRN
  Filled 2015-12-20: qty 10
  Filled 2015-12-20: qty 5

## 2015-12-20 MED ORDER — LACTATED RINGERS IV SOLN
500.0000 mL | INTRAVENOUS | Status: DC | PRN
  Administered 2015-12-21 (×2): 500 mL via INTRAVENOUS

## 2015-12-20 MED ORDER — FENTANYL CITRATE (PF) 100 MCG/2ML IJ SOLN
100.0000 ug | INTRAMUSCULAR | Status: DC | PRN
  Administered 2015-12-20: 100 ug via INTRAVENOUS
  Filled 2015-12-20: qty 2

## 2015-12-20 MED ORDER — MISOPROSTOL 25 MCG QUARTER TABLET
25.0000 ug | ORAL_TABLET | ORAL | Status: DC | PRN
  Administered 2015-12-20: 25 ug via VAGINAL
  Filled 2015-12-20: qty 0.25
  Filled 2015-12-20: qty 1

## 2015-12-20 MED ORDER — ACETAMINOPHEN 500 MG PO TABS
1000.0000 mg | ORAL_TABLET | Freq: Once | ORAL | Status: AC
  Administered 2015-12-20: 1000 mg via ORAL
  Filled 2015-12-20: qty 2

## 2015-12-20 MED ORDER — GLYBURIDE 5 MG PO TABS
10.0000 mg | ORAL_TABLET | Freq: Two times a day (BID) | ORAL | Status: DC
  Filled 2015-12-20 (×2): qty 2

## 2015-12-20 MED ORDER — OXYTOCIN 40 UNITS IN LACTATED RINGERS INFUSION - SIMPLE MED
2.5000 [IU]/h | INTRAVENOUS | Status: DC
  Filled 2015-12-20: qty 1000

## 2015-12-20 MED ORDER — LACTATED RINGERS IV SOLN
500.0000 mL | Freq: Once | INTRAVENOUS | Status: AC
  Administered 2015-12-20: 500 mL via INTRAVENOUS

## 2015-12-20 MED ORDER — LABETALOL HCL 5 MG/ML IV SOLN
20.0000 mg | INTRAVENOUS | Status: DC | PRN
  Administered 2015-12-21: 20 mg via INTRAVENOUS
  Filled 2015-12-20: qty 4
  Filled 2015-12-20: qty 8

## 2015-12-20 MED ORDER — ONDANSETRON HCL 4 MG/2ML IJ SOLN
4.0000 mg | Freq: Four times a day (QID) | INTRAMUSCULAR | Status: DC | PRN
  Administered 2015-12-21: 4 mg via INTRAVENOUS
  Filled 2015-12-20 (×2): qty 2

## 2015-12-20 MED ORDER — HYDRALAZINE HCL 20 MG/ML IJ SOLN: 10.0000 mg | Freq: Once | INTRAMUSCULAR | Status: DC | PRN

## 2015-12-20 MED ORDER — LACTATED RINGERS IV SOLN
INTRAVENOUS | Status: DC
  Administered 2015-12-20 – 2015-12-21 (×2): via INTRAVENOUS
  Administered 2015-12-21: 100 mL/h via INTRAVENOUS

## 2015-12-20 MED ORDER — EPHEDRINE 5 MG/ML INJ
10.0000 mg | INTRAVENOUS | Status: DC | PRN
  Filled 2015-12-20: qty 4

## 2015-12-20 MED ORDER — LACTATED RINGERS IV BOLUS (SEPSIS)
1000.0000 mL | Freq: Once | INTRAVENOUS | Status: AC
  Administered 2015-12-20: 500 mL via INTRAVENOUS

## 2015-12-20 MED ORDER — TERBUTALINE SULFATE 1 MG/ML IJ SOLN
0.2500 mg | Freq: Once | INTRAMUSCULAR | Status: DC | PRN
  Filled 2015-12-20: qty 1

## 2015-12-20 MED ORDER — OXYTOCIN BOLUS FROM INFUSION: 500.0000 mL | Freq: Once | INTRAVENOUS | Status: DC

## 2015-12-20 MED ORDER — FENTANYL 2.5 MCG/ML BUPIVACAINE 1/10 % EPIDURAL INFUSION (WH - ANES)
14.0000 mL/h | INTRAMUSCULAR | Status: DC | PRN
  Administered 2015-12-20 – 2015-12-21 (×3): 14 mL/h via EPIDURAL
  Filled 2015-12-20 (×3): qty 125

## 2015-12-20 MED ORDER — LABETALOL HCL 200 MG PO TABS
200.0000 mg | ORAL_TABLET | Freq: Two times a day (BID) | ORAL | Status: DC
  Administered 2015-12-20 – 2015-12-21 (×2): 200 mg via ORAL
  Filled 2015-12-20 (×4): qty 1

## 2015-12-20 MED ORDER — ACETAMINOPHEN 325 MG PO TABS: 650.0000 mg | ORAL_TABLET | ORAL | Status: DC | PRN

## 2015-12-20 MED ORDER — PENICILLIN G POTASSIUM 5000000 UNITS IJ SOLR
2.5000 10*6.[IU] | INTRAVENOUS | Status: DC
  Administered 2015-12-21 (×4): 2.5 10*6.[IU] via INTRAVENOUS
  Filled 2015-12-20 (×6): qty 2.5

## 2015-12-20 MED ORDER — SOD CITRATE-CITRIC ACID 500-334 MG/5ML PO SOLN
30.0000 mL | ORAL | Status: DC | PRN
  Administered 2015-12-21: 30 mL via ORAL
  Filled 2015-12-20: qty 15

## 2015-12-20 MED ORDER — OXYCODONE-ACETAMINOPHEN 5-325 MG PO TABS: 1.0000 | ORAL_TABLET | ORAL | Status: DC | PRN

## 2015-12-20 MED ORDER — PENICILLIN G POTASSIUM 5000000 UNITS IJ SOLR
5.0000 10*6.[IU] | Freq: Once | INTRAVENOUS | Status: AC
  Administered 2015-12-21: 5 10*6.[IU] via INTRAVENOUS
  Filled 2015-12-20: qty 5

## 2015-12-20 MED ORDER — OXYCODONE-ACETAMINOPHEN 5-325 MG PO TABS
2.0000 | ORAL_TABLET | ORAL | Status: DC | PRN
Start: 2015-12-20 — End: 2015-12-21

## 2015-12-20 MED ORDER — LIDOCAINE HCL (PF) 1 % IJ SOLN
30.0000 mL | INTRAMUSCULAR | Status: DC | PRN
  Filled 2015-12-20: qty 30

## 2015-12-20 MED ORDER — PHENYLEPHRINE 40 MCG/ML (10ML) SYRINGE FOR IV PUSH (FOR BLOOD PRESSURE SUPPORT)
80.0000 ug | PREFILLED_SYRINGE | INTRAVENOUS | Status: DC | PRN
  Filled 2015-12-20: qty 5

## 2015-12-20 MED ORDER — METFORMIN HCL 500 MG PO TABS
1000.0000 mg | ORAL_TABLET | Freq: Two times a day (BID) | ORAL | Status: DC
  Administered 2015-12-20: 500 mg via ORAL
  Filled 2015-12-20 (×2): qty 2

## 2015-12-20 MED ORDER — FLEET ENEMA 7-19 GM/118ML RE ENEM: 1.0000 | ENEMA | RECTAL | Status: DC | PRN

## 2015-12-20 NOTE — Anesthesia Pain Management Evaluation Note (Signed)
  [facility] Pain Management Visit Note  Patient: Rita Lamb, 40 y.o., female  "Hello I am a member of the anesthesia team at [facility]. We have an anesthesia team available at all times to provide care throughout the hospital, including epidural management and anesthesia for C-section. I don't know your plan for the delivery whether it a natural birth, water birth, IV sedation, nitrous supplementation, doula or epidural, but we want to meet your pain goals."   1.Was your pain managed to your expectations on prior hospitalizations?   Yes   2.What is your expectation for pain management during this hospitalization?     Epidural  3.How can we help you reach that goal? epidural  Record the patient's initial score and the patient's pain goal.   Pain: 8  Pain Goal: 6 [facility] wants you to be able to say your pain was always managed very well.  Tereso Unangst 12/20/2015

## 2015-12-20 NOTE — H&P (Signed)
HPI: Rita Lamb is a 40 y.o. year old G7P2002 female at [redacted]w[redacted]d weeks gestation who presents to [facility] reporting contractions. Denies LOF or VB. Had "splitting HA" this morning that resolved spontaneously, no vision changes or epigastric pain.    Clinic  [facility] Prenatal Labs  Dating 12 week scan Blood type: A/POS/-- (02/06 0844)   Genetic Screen  Declined Antibody:NEG (02/06 0844)  Anatomic [place]  Limited- follow up on 5/30 Rubella: 1.04 (02/06 0844)  GTT Early:  157 > need 3 hr REFUSED-Will Tx as GDM RPR: REACTIVE (02/06 0844); neg treponemal, 06/29/15  Flu vaccine  Declined HBsAg: NEGATIVE (02/06 0844)   TDaP vaccine    10/12/15                                            HIV: NONREACTIVE (02/06 0844)   Baby Food  Breast (desires)                                             GBS: Positive  Contraception  BTL title XIX papers signed 11/09/2015 Pap: 07/23/15; Negative  Circumcision  girl   Pediatrician  [facility]   Support Person  Marjory Lies (partner)     OB History    Gravida Para Term Preterm AB Living   3 2 2     2    SAB TAB Ectopic Multiple Live Births                 Past Medical History:  Diagnosis Date  . Arthritis    bilateral knee  . Asthma   . Bell's palsy   . Chronic hypertension complicating or reason for care during pregnancy 07/23/2015  . Gestational diabetes   . Helicobacter pylori (H. pylori) infection   . Hypertension    Past Surgical History:  Procedure Laterality Date  . CRYOTHERAPY  1996   cervix  . TONSILLECTOMY AND ADENOIDECTOMY     Family History: family history includes Alcohol abuse in her mother and sister; Cancer in her maternal grandfather, paternal grandfather, and paternal grandmother; Cancer (age of onset: 34) in her maternal grandmother; Colon cancer in her paternal grandfather; Dementia in her maternal grandmother; Diabetes in her father, maternal grandfather, mother, paternal grandmother, and sister; HIV in her mother; Heart disease in her paternal  grandmother; Stroke in her paternal grandfather. Social History:  reports that she is a non-smoker but has been exposed to tobacco smoke. She has never used smokeless tobacco. She reports that she does not drink alcohol or use drugs.     Maternal Diabetes: Yes:  Diabetes Type:  Insulin/Medication controlled Genetic Screening: Declined Maternal Ultrasounds/Referrals: Abnormal:  Findings:   Other:macrosomia Fetal Ultrasounds or other Referrals:  Fetal echo normal Maternal Substance Abuse:  No Significant Maternal Medications:  Meds include: Other: Labetalol, Metformin, Glyburide Significant Maternal Lab Results:  Lab values include: Group B Strep positive Other Comments:  A2/BGDM, CHTN w/ Pre-E  Review of Systems  Constitutional: Negative for chills and fever.  HENT: Negative for congestion.   Eyes: Negative for blurred vision and double vision.  Gastrointestinal: Positive for abdominal pain (contractions only).  Genitourinary:       Neg for LOF, VB  Neurological: Positive for headaches.   History Dilation: 3 Exam by:: V.  Sherley Mckenney CNM Blood pressure 145/80, pulse 85, temperature 98.3 F (36.8 C), resp. rate 18, last menstrual period 02/16/2015. Maternal Exam:  Uterine Assessment: Contraction strength is moderate.  Contraction frequency is irregular.   Abdomen: Patient reports no abdominal tenderness. Estimated fetal weight is 8 lb by [place] three weeks ago, Leoplod's ~9-4.   Fetal presentation: vertex  Introitus: Normal vulva. Normal vagina.  Pelvis: questionable for delivery.   Cervix: Cervix evaluated by digital exam.     Physical Exam  Constitutional: She is oriented to person, place, and time. She appears well-developed and well-nourished. She appears distressed.  HENT:  Head: Normocephalic.  Drooping left side of mouth (chronic)  Eyes: Conjunctivae are normal.  Cardiovascular: Normal rate, regular rhythm and normal heart sounds.   Respiratory: Effort normal and breath sounds  normal. No respiratory distress.  GI: Soft. There is no tenderness.  Genitourinary: Vagina normal and uterus normal.  Musculoskeletal: She exhibits edema. She exhibits no tenderness.  Neurological: She is alert and oriented to person, place, and time. She has normal reflexes.  Skin: Skin is warm and dry.  Psychiatric: She has a normal mood and affect.    Prenatal labs: ABO, Rh: A/POS/-- (02/06 0844) Antibody: NEG (02/06 0844) Rubella: 1.04 (02/06 0844) RPR: REACTIVE (06/05 0001)  HBsAg: NEGATIVE (02/06 0844)  HIV: NONREACTIVE (06/05 0001)  GBS:   Pos  Assessment: 1. Labor: Prodromal 2. Fetal Wellbeing: Category I  3. Pain Control: Fentanyl 4. GBS: Pos 5. 37.0 week IUP 6. CHTN w/ Superimposed pre-E 7. A2/B GDM on max PO meds 8. Suspected fetal macrosomia  Plan:  1. Admit to BS per consult with MD 2. Routine L&D orders 3. Analgesia/anesthesia PRN  4. Continue PO labetalol, Metformin, Glyburide 5 Hold Mag sulfate for now. Will start for HA that do not resolve w/ Tylenol or other severe features.  6. Foley Bulb, Cytotec   [place] 12/20/2015, 7:22 PM

## 2015-12-20 NOTE — MAU Note (Signed)
Pt states she started ctxing this afternoon. Pt denies LOF or vaginal bleeding. Fetus active.

## 2015-12-20 NOTE — Progress Notes (Signed)
LABOR PROGRESS NOTE  Rita Lamb is a 40 y.o. [id number] at [redacted]w[redacted]d  admitted for early labor and pre-eclampsia  Subjective: PT doing well rates her contraction 6/10 and they are coming every 8-10 minutes.   Objective: BP (!) 138/93   Pulse 86   Temp 98.1 F (36.7 C) (Oral)   Resp 18   Ht 5\' 3"  (1.6 m)   Wt 262 lb (118.8 kg)   LMP 02/16/2015 (Approximate)   BMI 46.41 kg/m  or  Vitals:   12/20/15 2000 12/20/15 2039 12/20/15 2056 12/20/15 2058  BP: 134/80 (!) 138/93    Pulse: 88 86    Resp:   18   Temp:   98.1 F (36.7 C)   TempSrc:   Oral   Weight:    262 lb (118.8 kg)  Height:    5\' 3"  (1.6 m)     Dilation: 3 Effacement (%): 80 Station: -3 Presentation: Vertex Exam by:: Dr. Baron Sane  Labs: Lab Results  Component Value Date   WBC 10.5 12/20/2015   HGB 10.3 (L) 12/20/2015   HCT 32.1 (L) 12/20/2015   MCV 87.7 12/20/2015   PLT 284 12/20/2015    Patient Active Problem List   Diagnosis Date Noted  . Pre-eclampsia superimposed on chronic hypertension, antepartum 12/20/2015  . Group B Streptococcus carrier, +RV culture, currently pregnant 12/18/2015  . Advanced maternal age in multigravida 08/17/2015  . GDM, class A2 08/13/2015  . Obesity in pregnancy, antepartum 08/13/2015  . Chronic hypertension complicating or reason for care during pregnancy 07/23/2015  . Supervision of high risk pregnancy, antepartum 07/23/2015  . Biological false positive RPR test 07/23/2015  . History of abnormal cervical Pap smear 07/23/2015  . Irregular periods 02/28/2014  . Impaired glucose tolerance-last A1c 5.3 02/15/2012  . Hirsutism 02/15/2012  . Hyperlipidemia LDL goal < 100 02/15/2012  . Astigmatism of left eye 12/05/2011  . Asthma, intermittent 10/07/2011  . Obesity-class 3/Morbid 10/07/2011  . History of gestational diabetes in prior pregnancy, currently pregnant 10/07/2011    Assessment / Plan: 40 y.o. G3P2002 at [redacted]w[redacted]d here for early labor and pre-eclampsia  Labor: early  labor foley and cytotec placed at 2130 Fetal Wellbeing:  Category 1 Pain Control:  Wants epidural Anticipated MOD:  svd  [name], MD 12/20/2015, 9:39 PM ;a

## 2015-12-20 NOTE — Anesthesia Preprocedure Evaluation (Signed)
Anesthesia Evaluation  Patient identified by MRN, date of birth, ID band Patient awake    Reviewed: Allergy & Precautions, H&P , NPO status , Patient's Chart, lab work & pertinent test results  Airway Mallampati: II  TM Distance: >3 FB Neck ROM: full    Dental no notable dental hx.    Pulmonary    Pulmonary exam normal        Cardiovascular hypertension, Pt. on home beta blockers Normal cardiovascular exam     Neuro/Psych negative psych ROS   GI/Hepatic negative GI ROS, Neg liver ROS,   Endo/Other  diabetes, Gestational, Oral Hypoglycemic AgentsMorbid obesity  Renal/GU negative Renal ROS     Musculoskeletal   Abdominal (+) + obese,   Peds  Hematology negative hematology ROS (+)   Anesthesia Other Findings   Reproductive/Obstetrics (+) Pregnancy                             Anesthesia Physical Anesthesia Plan  ASA: III  Anesthesia Plan: Epidural   Post-op Pain Management:    Induction:   Airway Management Planned:   Additional Equipment:   Intra-op Plan:   Post-operative Plan:   Informed Consent: I have reviewed the patients History and Physical, chart, labs and discussed the procedure including the risks, benefits and alternatives for the proposed anesthesia with the patient or authorized representative who has indicated his/her understanding and acceptance.     Plan Discussed with:   Anesthesia Plan Comments:         Anesthesia Quick Evaluation

## 2015-12-21 ENCOUNTER — Encounter (HOSPITAL_COMMUNITY): Payer: Self-pay | Admitting: *Deleted

## 2015-12-21 ENCOUNTER — Ambulatory Visit (HOSPITAL_COMMUNITY)
Admission: RE | Admit: 2015-12-21 | Payer: BLUE CROSS/BLUE SHIELD | Source: Ambulatory Visit | Attending: Maternal and Fetal Medicine | Admitting: Maternal and Fetal Medicine

## 2015-12-21 ENCOUNTER — Other Ambulatory Visit: Payer: BLUE CROSS/BLUE SHIELD | Admitting: Obstetrics & Gynecology

## 2015-12-21 DIAGNOSIS — O1414 Severe pre-eclampsia complicating childbirth: Secondary | ICD-10-CM

## 2015-12-21 DIAGNOSIS — O99824 Streptococcus B carrier state complicating childbirth: Secondary | ICD-10-CM

## 2015-12-21 DIAGNOSIS — Z3A37 37 weeks gestation of pregnancy: Secondary | ICD-10-CM

## 2015-12-21 DIAGNOSIS — O24425 Gestational diabetes mellitus in childbirth, controlled by oral hypoglycemic drugs: Secondary | ICD-10-CM

## 2015-12-21 LAB — CBC
HCT: 31.6 % — ABNORMAL LOW (ref 36.0–46.0)
Hemoglobin: 10 g/dL — ABNORMAL LOW (ref 12.0–15.0)
MCH: 28 pg (ref 26.0–34.0)
MCHC: 31.6 g/dL (ref 30.0–36.0)
MCV: 88.5 fL (ref 78.0–100.0)
PLATELETS: 284 10*3/uL (ref 150–400)
RBC: 3.57 MIL/uL — AB (ref 3.87–5.11)
RDW: 16.3 % — AB (ref 11.5–15.5)
WBC: 15.7 10*3/uL — ABNORMAL HIGH (ref 4.0–10.5)

## 2015-12-21 LAB — GLUCOSE, CAPILLARY
GLUCOSE-CAPILLARY: 105 mg/dL — AB (ref 65–99)
Glucose-Capillary: 107 mg/dL — ABNORMAL HIGH (ref 65–99)
Glucose-Capillary: 153 mg/dL — ABNORMAL HIGH (ref 65–99)
Glucose-Capillary: 186 mg/dL — ABNORMAL HIGH (ref 65–99)
Glucose-Capillary: 82 mg/dL (ref 65–99)
Glucose-Capillary: 92 mg/dL (ref 65–99)

## 2015-12-21 LAB — ABO/RH: ABO/RH(D): A POS

## 2015-12-21 MED ORDER — MEASLES, MUMPS & RUBELLA VAC ~~LOC~~ INJ: 0.5000 mL | INJECTION | Freq: Once | SUBCUTANEOUS | Status: DC

## 2015-12-21 MED ORDER — OXYTOCIN 40 UNITS IN LACTATED RINGERS INFUSION - SIMPLE MED
1.0000 m[IU]/min | INTRAVENOUS | Status: DC
  Administered 2015-12-21: 1 m[IU]/min via INTRAVENOUS

## 2015-12-21 MED ORDER — MAGNESIUM SULFATE 50 % IJ SOLN
2.0000 g/h | INTRAVENOUS | Status: AC
  Administered 2015-12-22: 2 g/h via INTRAVENOUS
  Filled 2015-12-21 (×2): qty 80

## 2015-12-21 MED ORDER — MAGNESIUM SULFATE BOLUS VIA INFUSION
4.0000 g | Freq: Once | INTRAVENOUS | Status: AC
  Administered 2015-12-21: 4 g via INTRAVENOUS
  Filled 2015-12-21: qty 500

## 2015-12-21 MED ORDER — OXYCODONE-ACETAMINOPHEN 5-325 MG PO TABS: 1.0000 | ORAL_TABLET | ORAL | Status: DC | PRN

## 2015-12-21 MED ORDER — HYDROCHLOROTHIAZIDE 25 MG PO TABS
25.0000 mg | ORAL_TABLET | Freq: Every day | ORAL | Status: DC
  Administered 2015-12-22 – 2015-12-23 (×2): 25 mg via ORAL
  Filled 2015-12-21 (×3): qty 1

## 2015-12-21 MED ORDER — DIBUCAINE 1 % RE OINT: 1.0000 "application " | TOPICAL_OINTMENT | RECTAL | Status: DC | PRN

## 2015-12-21 MED ORDER — TERBUTALINE SULFATE 1 MG/ML IJ SOLN
0.2500 mg | Freq: Once | INTRAMUSCULAR | Status: AC | PRN
  Administered 2015-12-21: 0.25 mg via SUBCUTANEOUS

## 2015-12-21 MED ORDER — ACETAMINOPHEN 325 MG PO TABS
650.0000 mg | ORAL_TABLET | ORAL | Status: DC | PRN
Start: 2015-12-21 — End: 2015-12-23
  Administered 2015-12-22: 650 mg via ORAL
  Filled 2015-12-21: qty 2

## 2015-12-21 MED ORDER — COCONUT OIL OIL: 1.0000 "application " | TOPICAL_OIL | Status: DC | PRN

## 2015-12-21 MED ORDER — IBUPROFEN 600 MG PO TABS
600.0000 mg | ORAL_TABLET | Freq: Four times a day (QID) | ORAL | Status: DC
  Administered 2015-12-21 – 2015-12-23 (×7): 600 mg via ORAL
  Filled 2015-12-21 (×8): qty 1

## 2015-12-21 MED ORDER — METFORMIN HCL 500 MG PO TABS
500.0000 mg | ORAL_TABLET | Freq: Two times a day (BID) | ORAL | Status: DC
  Administered 2015-12-21: 500 mg via ORAL
  Filled 2015-12-21 (×2): qty 1

## 2015-12-21 MED ORDER — OXYTOCIN 40 UNITS IN LACTATED RINGERS INFUSION - SIMPLE MED: 1.0000 m[IU]/min | INTRAVENOUS | Status: DC

## 2015-12-21 MED ORDER — ONDANSETRON HCL 4 MG/2ML IJ SOLN
4.0000 mg | Freq: Once | INTRAMUSCULAR | Status: AC
  Administered 2015-12-21: 4 mg via INTRAVENOUS

## 2015-12-21 MED ORDER — LIDOCAINE HCL (PF) 1 % IJ SOLN
INTRAMUSCULAR | Status: DC | PRN
  Administered 2015-12-20 (×2): 7 mL via EPIDURAL

## 2015-12-21 MED ORDER — TETANUS-DIPHTH-ACELL PERTUSSIS 5-2.5-18.5 LF-MCG/0.5 IM SUSP: 0.5000 mL | Freq: Once | INTRAMUSCULAR | Status: DC

## 2015-12-21 MED ORDER — MAGNESIUM SULFATE 50 % IJ SOLN
2.0000 g/h | INTRAVENOUS | Status: DC
  Filled 2015-12-21: qty 80

## 2015-12-21 MED ORDER — SODIUM BICARBONATE 8.4 % IV SOLN
INTRAVENOUS | Status: DC | PRN
  Administered 2015-12-21 (×2): 3 mL via EPIDURAL

## 2015-12-21 MED ORDER — FERROUS SULFATE 325 (65 FE) MG PO TABS
325.0000 mg | ORAL_TABLET | Freq: Two times a day (BID) | ORAL | Status: DC
  Administered 2015-12-22 – 2015-12-23 (×3): 325 mg via ORAL
  Filled 2015-12-21 (×3): qty 1

## 2015-12-21 MED ORDER — DIPHENHYDRAMINE HCL 25 MG PO CAPS: 25.0000 mg | ORAL_CAPSULE | Freq: Four times a day (QID) | ORAL | Status: DC | PRN

## 2015-12-21 MED ORDER — OXYCODONE-ACETAMINOPHEN 5-325 MG PO TABS: 2.0000 | ORAL_TABLET | ORAL | Status: DC | PRN

## 2015-12-21 MED ORDER — ONDANSETRON HCL 4 MG/2ML IJ SOLN: 4.0000 mg | INTRAMUSCULAR | Status: DC | PRN

## 2015-12-21 MED ORDER — MISOPROSTOL 200 MCG PO TABS
ORAL_TABLET | ORAL | Status: AC
  Filled 2015-12-21: qty 5

## 2015-12-21 MED ORDER — PRENATAL MULTIVITAMIN CH
1.0000 | ORAL_TABLET | Freq: Every day | ORAL | Status: DC
  Administered 2015-12-22: 1 via ORAL
  Filled 2015-12-21 (×2): qty 1

## 2015-12-21 MED ORDER — WITCH HAZEL-GLYCERIN EX PADS: 1.0000 "application " | MEDICATED_PAD | CUTANEOUS | Status: DC | PRN

## 2015-12-21 MED ORDER — MAGNESIUM HYDROXIDE 400 MG/5ML PO SUSP: 30.0000 mL | ORAL | Status: DC | PRN

## 2015-12-21 MED ORDER — BENZOCAINE-MENTHOL 20-0.5 % EX AERO
1.0000 "application " | INHALATION_SPRAY | CUTANEOUS | Status: DC | PRN
  Administered 2015-12-21: 1 via TOPICAL
  Filled 2015-12-21: qty 56

## 2015-12-21 MED ORDER — SIMETHICONE 80 MG PO CHEW: 80.0000 mg | CHEWABLE_TABLET | ORAL | Status: DC | PRN

## 2015-12-21 MED ORDER — ZOLPIDEM TARTRATE 5 MG PO TABS: 5.0000 mg | ORAL_TABLET | Freq: Every evening | ORAL | Status: DC | PRN

## 2015-12-21 MED ORDER — ONDANSETRON HCL 4 MG PO TABS: 4.0000 mg | ORAL_TABLET | ORAL | Status: DC | PRN

## 2015-12-21 NOTE — Anesthesia Procedure Notes (Addendum)
Epidural Patient location during procedure: OB Start time: 12/20/2015 11:47 PM End time: 12/20/2015 11:51 PM  Staffing Anesthesiologist: Lyn Hollingshead Performed: anesthesiologist   Preanesthetic Checklist Completed: patient identified, surgical consent, pre-op evaluation, timeout performed, IV checked, risks and benefits discussed and monitors and equipment checked  Epidural Patient position: sitting Prep: site prepped and draped and DuraPrep Patient monitoring: continuous pulse ox and blood pressure Approach: midline Location: L3-L4 Injection technique: LOR air  Needle:  Needle type: Tuohy  Needle gauge: 17 G Needle length: 9 cm and 9 Needle insertion depth: 8 cm Catheter type: closed end flexible Catheter size: 19 Gauge Catheter at skin depth: 13 cm Test dose: negative and Other  Assessment Sensory level: T9 Events: blood not aspirated, injection not painful, no injection resistance, negative IV test and no paresthesia  Additional Notes Reason for block:procedure for pain

## 2015-12-21 NOTE — Progress Notes (Signed)
Patient ID: Rita Lamb, female   DOB: [birth year], 40 y.o.   MRN: [id number] Rita Lamb is a 40 y.o. G3P2002 at [redacted]w[redacted]d.  Subjective: Comfortable w/ epidural.  Objective: BP (!) 155/85   Pulse 87   Temp 98.5 F (36.9 C) (Oral)   Resp 20   Ht 5\' 3"  (1.6 m)   Wt 262 lb (118.8 kg)   LMP 02/16/2015 (Approximate)   BMI 46.41 kg/m    FHT:  FHR: 145 bpm, variability: mod,  accelerations:  15x15,  decelerations:  none UC:   Q 2-4 minutes, mod Dilation: 6 Effacement (%): 50 Cervical Position: Middle Station: -3 Presentation: Vertex Exam by:: , cnm  Labs: Results for orders placed or performed during the hospital encounter of 12/20/15 (from the past 24 hour(s))  Urinalysis with microscopic (not at [facility])     Status: Abnormal   Collection Time: 12/20/15  5:13 PM  Result Value Ref Range   Color, Urine YELLOW YELLOW   APPearance HAZY (A) CLEAR   Specific Gravity, Urine 1.020 1.005 - 1.030   pH 6.0 5.0 - 8.0   Glucose, UA 500 (A) NEGATIVE mg/dL   Hgb urine dipstick MODERATE (A) NEGATIVE   Bilirubin Urine NEGATIVE NEGATIVE   Ketones, ur NEGATIVE NEGATIVE mg/dL   Protein, ur NEGATIVE NEGATIVE mg/dL   Nitrite NEGATIVE NEGATIVE   Leukocytes, UA MODERATE (A) NEGATIVE   WBC, UA 6-30 0 - 5 WBC/hpf   RBC / HPF 0-5 0 - 5 RBC/hpf   Bacteria, UA FEW (A) NONE SEEN   Squamous Epithelial / LPF 6-30 (A) NONE SEEN  Protein / creatinine ratio, urine     Status: Abnormal   Collection Time: 12/20/15  5:13 PM  Result Value Ref Range   Creatinine, Urine 104.00 mg/dL   Total Protein, Urine 32 mg/dL   Protein Creatinine Ratio 0.31 (H) 0.00 - 0.15 mg/mg[Cre]  Comprehensive metabolic panel     Status: Abnormal   Collection Time: 12/20/15  5:27 PM  Result Value Ref Range   Sodium 133 (L) 135 - 145 mmol/L   Potassium 4.1 3.5 - 5.1 mmol/L   Chloride 107 101 - 111 mmol/L   CO2 21 (L) 22 - 32 mmol/L   Glucose, Bld 158 (H) 65 - 99 mg/dL   BUN 7 6 - 20 mg/dL   Creatinine, Ser 0.52 0.44 - 1.00  mg/dL   Calcium 8.4 (L) 8.9 - 10.3 mg/dL   Total Protein 6.3 (L) 6.5 - 8.1 g/dL   Albumin 2.8 (L) 3.5 - 5.0 g/dL   AST 18 15 - 41 U/L   ALT 18 14 - 54 U/L   Alkaline Phosphatase 117 38 - 126 U/L   Total Bilirubin 0.5 0.3 - 1.2 mg/dL   GFR calc non Af Amer >60 >60 mL/min   GFR calc Af Amer >60 >60 mL/min   Anion gap 5 5 - 15  CBC     Status: Abnormal   Collection Time: 12/20/15  5:27 PM  Result Value Ref Range   WBC 10.5 4.0 - 10.5 K/uL   RBC 3.66 (L) 3.87 - 5.11 MIL/uL   Hemoglobin 10.3 (L) 12.0 - 15.0 g/dL   HCT 32.1 (L) 36.0 - 46.0 %   MCV 87.7 78.0 - 100.0 fL   MCH 28.1 26.0 - 34.0 pg   MCHC 32.1 30.0 - 36.0 g/dL   RDW 16.0 (H) 11.5 - 15.5 %   Platelets 284 150 - 400 K/uL  Type and screen [facility] [place]     Status: None   Collection Time: 12/20/15  5:27 PM  Result Value Ref Range   ABO/RH(D) A POS    Antibody Screen NEG    Sample Expiration 12/23/2015   ABO/Rh     Status: None   Collection Time: 12/20/15  5:27 PM  Result Value Ref Range   ABO/RH(D) A POS   Glucose, capillary     Status: Abnormal   Collection Time: 12/20/15 10:19 PM  Result Value Ref Range   Glucose-Capillary 186 (H) 65 - 99 mg/dL  Glucose, capillary     Status: Abnormal   Collection Time: 12/21/15 12:29 AM  Result Value Ref Range   Glucose-Capillary 153 (H) 65 - 99 mg/dL  Glucose, capillary     Status: Abnormal   Collection Time: 12/21/15  4:10 AM  Result Value Ref Range   Glucose-Capillary 105 (H) 65 - 99 mg/dL  Glucose, capillary     Status: Abnormal   Collection Time: 12/21/15  8:23 AM  Result Value Ref Range   Glucose-Capillary 107 (H) 65 - 99 mg/dL    Assessment / Plan: [redacted]w[redacted]d week IUP Labor: Early Fetal Wellbeing:  Category I Pain Control:  Epidural Anticipated MOD:  SVD if FHR remains reassuring.   [name], CNM 12/21/2015 11:40 AM

## 2015-12-21 NOTE — Progress Notes (Signed)
Patient ID: Rita Lamb, female   DOB: 01/28/1976, 40 y.o.   MRN: [id number] Rita Lamb is a 40 y.o. G3P2002 at [redacted]w[redacted]d.  Subjective: Pelvic pressure w/ UC's.   Objective: BP 125/73   Pulse 79   Temp 98.7 F (37.1 C) (Oral)   Resp 20   Ht 5\' 3"  (1.6 m)   Wt 262 lb (118.8 kg)   LMP 02/16/2015 (Approximate)   BMI 46.41 kg/m    FHT:  FHR: 145 bpm, variability: min,  accelerations:  15x15,  decelerations: earlies, variables. Lates occurred after pitocin restarted.  UC:   Q 3-4 minutes, moderate-strong Dilation: 7 Effacement (%): 60 Cervical Position: Middle Station: -2 Presentation: Vertex Exam by:: Neeti Knudtson, v  Labs: NA  Assessment / Plan: [redacted]w[redacted]d week IUP Labor: Active Pre-E now w/ multiple severely elevated BP's and HA--Meets criteria for Pre-E w/ severe features--Mag ordered. Fetal Wellbeing:  Category I-II Pain Control:  Epidural Anticipated MOD:  Uncertain due to decels when pitocin is infusing. Will stop pitocin again and see how pt contracts on her own. Contraction pattern has improved since AROM.   [place], CNM 12/21/2015 1:15 PM

## 2015-12-21 NOTE — Progress Notes (Signed)
MD notified at [place] due to patient contractions spacing out and order given to start Pitocin. When in to start Pitocin at [place] noticed patient having repeative lates. MD notified to review strip and that Pitocin not started. IV fluid bolus, patient repositioned, O2 given, and per MD request Terbutaline given. Pt given one hour and order to start Pitocin 1x1 at 0250. Dr. Baron Sane in at bedside and explain to patient possibility of c-section should baby not tolerate labor. No distress noted on monitor at this time. Will continue to monitor.

## 2015-12-22 ENCOUNTER — Ambulatory Visit (HOSPITAL_COMMUNITY): Payer: BLUE CROSS/BLUE SHIELD

## 2015-12-22 LAB — RPR: RPR Ser Ql: REACTIVE — AB

## 2015-12-22 LAB — RPR, QUANT+TP ABS (REFLEX)
Rapid Plasma Reagin, Quant: 1:2 {titer} — ABNORMAL HIGH
TREPONEMA PALLIDUM AB: NEGATIVE

## 2015-12-22 LAB — GLUCOSE, CAPILLARY: Glucose-Capillary: 225 mg/dL — ABNORMAL HIGH (ref 65–99)

## 2015-12-22 MED ORDER — METFORMIN HCL 500 MG PO TABS
500.0000 mg | ORAL_TABLET | Freq: Two times a day (BID) | ORAL | Status: DC
  Administered 2015-12-22 – 2015-12-23 (×3): 500 mg via ORAL
  Filled 2015-12-22 (×3): qty 1

## 2015-12-22 MED ORDER — LACTATED RINGERS IV SOLN
INTRAVENOUS | Status: DC
Start: 2015-12-22 — End: 2015-12-23
  Administered 2015-12-22 (×2): via INTRAVENOUS

## 2015-12-22 MED ORDER — CALCIUM CARBONATE ANTACID 500 MG PO CHEW
2.0000 | CHEWABLE_TABLET | Freq: Three times a day (TID) | ORAL | Status: DC | PRN
Start: 2015-12-22 — End: 2015-12-23
  Administered 2015-12-22: 400 mg via ORAL
  Filled 2015-12-22: qty 2

## 2015-12-22 NOTE — Progress Notes (Signed)
CSW acknowledges NICU admission.    Patient screened out for psychosocial assessment since none of the following apply:  Psychosocial stressors documented in mother or baby's chart  Gestation less than 32 weeks  Code at delivery   Infant with anomalies  Please contact the Clinical Social Worker if specific needs arise, or by MOB's request.    Lane Hacker, MSW Clinical Social Work: System [employer] for [employer] social worker [phone]

## 2015-12-22 NOTE — Progress Notes (Signed)
Post Partum Day 1 Subjective: no complaints, up ad lib, voiding, tolerating PO and No HA, visual disturbances, or epigastric pain  Reports eating and drinking coke prior to FBS  Objective: Blood pressure 136/73, pulse 75, temperature 98.1 F (36.7 C), resp. rate 20, height 5\' 3"  (1.6 m), weight 121.1 kg (267 lb), last menstrual period 02/16/2015, SpO2 100 %, unknown if currently breastfeeding.  Physical Exam:  General: alert, cooperative and no distress Heart: RRR Lungs: CTAB Lochia: appropriate Uterine Fundus: firm DVT Evaluation: No evidence of DVT seen on physical exam. Negative Homan's sign. No cords or calf tenderness. No significant calf/ankle edema.   Recent Labs  12/20/15 1727 12/21/15 1915  HGB 10.3* 10.0*  HCT 32.1* 31.6*  FBS-225   Assessment/Plan: PPD #1 S/p SVD Pre-eclampsia, delivered-stable, good diuresis A2GDM, delivered, labile  Continue MgSO4 x24 hrs (2130) then d/c Recheck CBG Consider discharge home later tomorrow    LOS: 2 days   Julianne Handler, CNM 12/22/2015, 10:36 AM

## 2015-12-22 NOTE — Anesthesia Postprocedure Evaluation (Signed)
Anesthesia Post Note  Patient: Rita Lamb  Procedure(s) Performed: * No procedures listed *  Patient location during evaluation: Mother Baby Anesthesia Type: Epidural Level of consciousness: awake and alert and oriented Pain management: satisfactory to patient Vital Signs Assessment: post-procedure vital signs reviewed and stable Respiratory status: spontaneous breathing and nonlabored ventilation Cardiovascular status: stable Postop Assessment: no headache, no backache, no signs of nausea or vomiting, adequate PO intake and patient able to bend at knees (patient up walking) Anesthetic complications: no     Last Vitals:  Vitals:   12/22/15 0500 12/22/15 0605  BP:  (!) 152/81  Pulse:  72  Resp: 20 20  Temp:      Last Pain:  Vitals:   12/22/15 0605  TempSrc:   PainSc: 0-No pain   Pain Goal: Patients Stated Pain Goal: 4 (12/21/15 2105)               Willa Rough

## 2015-12-22 NOTE — Lactation Note (Addendum)
This note was copied from a baby's chart. Lactation Consultation Note  Patient Name: Rita Lamb [id number] Date: 12/22/2015 Reason for consult: Initial assessment;NICU baby NICU baby 70 hours old. Mom reports that she nursed first child 2.5 years and second child 11 months until he self-weaned. Mom reports that she had oversupply with first child. Mom has large pendulous breasts with large everted nipples. Fitted mom with #30 flanges and mom reported increased comfort. Enc mom to pump both breasts simultaneously. Mom reports that she is active with [facility] and gave permission for [facility] BF referral to be sent--and it was faxed over. Enc mom to pump 8 times/24 hours for at least 15 minutes followed by hand expression. Mom given bottle for the first 10 ml of EBM she has at bedside, and mom enc to take to NICU. Discussed how to label milk, and EBM storage guidelines for NICU. Mom given [facility] brochure, and is aware of pumping rooms in the NICU and OP/BFSG and [place] phone line assistance after D/C.   Maternal Data Has patient been taught Hand Expression?: Yes Does the patient have breastfeeding experience prior to this delivery?: Yes  Feeding    LATCH Score/Interventions                      Lactation Tools Discussed/Used WIC Program: Yes Pump Review: Setup, frequency, and cleaning;Milk Storage Initiated by:: bedside RN Date initiated:: 12/21/15   Consult Status Consult Status: Follow-up Date: 12/23/15 Follow-up type: In-patient    Andres Labrum 12/22/2015, 3:01 PM

## 2015-12-23 LAB — GLUCOSE, CAPILLARY: Glucose-Capillary: 142 mg/dL — ABNORMAL HIGH (ref 65–99)

## 2015-12-23 MED ORDER — METFORMIN HCL 500 MG PO TABS: 1000.0000 mg | ORAL_TABLET | Freq: Two times a day (BID) | ORAL | Status: DC

## 2015-12-23 MED ORDER — METFORMIN HCL ER 500 MG PO TB24
500.0000 mg | ORAL_TABLET | Freq: Once | ORAL | Status: AC
  Administered 2015-12-23: 500 mg via ORAL
  Filled 2015-12-23: qty 1

## 2015-12-23 MED ORDER — METFORMIN HCL ER (MOD) 1000 MG PO TB24: 1000.0000 mg | ORAL_TABLET | Freq: Every day | ORAL | 3 refills | Status: DC

## 2015-12-23 MED ORDER — METFORMIN HCL 500 MG PO TABS: 500.0000 mg | ORAL_TABLET | Freq: Two times a day (BID) | ORAL | 2 refills | Status: DC

## 2015-12-23 NOTE — Progress Notes (Signed)
Patient discharged to home. Discharge information reviewed. Signs and symptoms of hypertension and preclampsia reviewed. Diabetes management reviewed. No questions at this time.

## 2015-12-23 NOTE — Discharge Summary (Signed)
OB Discharge Summary     Patient Name: Rita Lamb DOB: 08-Mar-1976 MRN: [id number]  Date of admission: 12/20/2015 Delivering MD: Manya Silvas   Date of discharge: 12/23/2015  Admitting diagnosis: 67 WKS, CTXS Intrauterine pregnancy: [redacted]w[redacted]d    Secondary diagnosis:  Active Problems:   Obesity-class 3/Morbid   Chronic hypertension complicating or reason for care during pregnancy   Supervision of high risk pregnancy, antepartum   GDM, class A2   Advanced maternal age in multigravida   Group B Streptococcus carrier, +RV culture, currently pregnant   Pre-eclampsia superimposed on chronic hypertension, antepartum  Additional problems: Uncontrolled diabetes     Discharge diagnosis: Term Pregnancy Delivered                                                                                                Post partum procedures:None, future PP BTL to be performed  Augmentation: AROM, Pitocin and Cytotec  Complications: None  Hospital course:  Induction of Labor With Vaginal Delivery   40y.o. yo G3P3003 at 40w1das admitted to the hospital 12/20/2015 for induction of labor.  Indication for induction: Preeclampsia, A2 DM and AMA.  Patient had an uncomplicated labor course as follows: Membrane Rupture Time/Date: 9:14 AM ,12/21/2015   Intrapartum Procedures: Episiotomy: None [1]                                         Lacerations:  None [1]  Patient had delivery of a Viable infant.  Information for the patient's newborn:  WhJenayah, Antu0[[id number]]Delivery Method: Vag-Spont   12/21/2015  Details of delivery can be found in separate delivery note.  Patient had a routine postpartum course. Patient is discharged home 01/08/16.   Physical exam Vitals:   12/22/15 2010 12/22/15 2104 12/23/15 0148 12/23/15 0549  BP: 137/71 (!) 156/79 (!) 114/46 127/63  Pulse: 76 70 74 76  Resp: 18  14 18   Temp: 98.1 F (36.7 C) 99 F (37.2 C) 98.1 F (36.7 C) 98.3 F (36.8 C)  TempSrc:  Oral Oral Oral Oral  SpO2:  98% 95% 97%  Weight:    272 lb 12 oz (123.7 kg)  Height:       General: alert, cooperative and no distress Lochia: appropriate Uterine Fundus: firm Incision: N/A DVT Evaluation: No evidence of DVT seen on physical exam. Negative Homan's sign. No cords or calf tenderness. Labs: Lab Results  Component Value Date   WBC 15.7 (H) 12/21/2015   HGB 10.0 (L) 12/21/2015   HCT 31.6 (L) 12/21/2015   MCV 88.5 12/21/2015   PLT 284 12/21/2015   CMP Latest Ref Rng & Units 12/20/2015  Glucose 65 - 99 mg/dL 158(H)  BUN 6 - 20 mg/dL 7  Creatinine 0.44 - 1.00 mg/dL 0.52  Sodium 135 - 145 mmol/L 133(L)  Potassium 3.5 - 5.1 mmol/L 4.1  Chloride 101 - 111 mmol/L 107  CO2 22 - 32 mmol/L 21(L)  Calcium 8.9 - 10.3 mg/dL 8.4(L)  Total Protein 6.5 - 8.1 g/dL 6.3(L)  Total Bilirubin 0.3 - 1.2 mg/dL 0.5  Alkaline Phos 38 - 126 U/L 117  AST 15 - 41 U/L 18  ALT 14 - 54 U/L 18    Discharge instruction: per After Visit Summary and "Baby and Me Booklet".  After visit meds:    Medication List    STOP taking these medications   glyBURIDE 5 MG tablet Commonly known as:  DIABETA   labetalol 200 MG tablet Commonly known as:  NORMODYNE     TAKE these medications   acetaminophen 500 MG tablet Commonly known as:  TYLENOL Take 1-2 tabs every 6 hours for pain. What changed:  how much to take  how to take this  when to take this  reasons to take this  additional instructions   albuterol 108 (90 Base) MCG/ACT inhaler Commonly known as:  PROVENTIL HFA;VENTOLIN HFA Inhale 2 puffs into the lungs every 6 (six) hours as needed for wheezing or shortness of breath. Reported on 08/17/2015   ASPIRIN LOW DOSE 81 MG chewable tablet Generic drug:  aspirin CHEW AND SWALLOW 1 TABLET(81 MG) BY MOUTH DAILY   BAYER CONTOUR MONITOR w/Device Kit 1 each by Does not apply route 4 (four) times daily. For testing four times per day DX GDM O24.419   BAYER MICROLET LANCETS  lancets Lancets compatible with Bayer Contour Monitor for testing 4 times daily DX GDM O24.419   glucose blood test strip Commonly known as:  BAYER CONTOUR TEST For testing 4 times daily. New DX GDM O24.419   hydrochlorothiazide 25 MG tablet Commonly known as:  HYDRODIURIL Take 1 tablet (25 mg total) by mouth daily.   metFORMIN 500 MG tablet Commonly known as:  GLUCOPHAGE Take 500 mg by mouth 2 (two) times daily with a meal. What changed:  Another medication with the same name was added. Make sure you understand how and when to take each.   metFORMIN 500 MG tablet Commonly known as:  GLUCOPHAGE Take 1 tablet (500 mg total) by mouth 2 (two) times daily with a meal. What changed:  You were already taking a medication with the same name, and this prescription was added. Make sure you understand how and when to take each.   prenatal multivitamin Tabs tablet Take 1 tablet by mouth daily at 12 noon.       Diet: carb modified diet  Activity: Advance as tolerated. Pelvic rest for 6 weeks.   Outpatient follow up: 2 weeks &  6 weeks  Follow up Visit: f/u in 2 week for pre-op visit for PP BTL Postpartum contraception: Tubal Ligation  Newborn Data: Live born female  Birth Weight: 7 lb 6.7 oz (3365 g) APGAR: 8/ 9  Baby Feeding: Breast Disposition:home with mother   12/23/2015 Katherine Basset, DO

## 2015-12-23 NOTE — Discharge Instructions (Signed)
Preeclampsia and Eclampsia Preeclampsia is a serious condition that develops only during pregnancy. It is also called toxemia of pregnancy. This condition causes high blood pressure along with other symptoms, such as swelling and headaches. These may develop as the condition gets worse. Preeclampsia may occur 20 weeks or later into your pregnancy.  Diagnosing and treating preeclampsia early is very important. If not treated early, it can cause serious problems for you and your baby. One problem it can lead to is eclampsia, which is a condition that causes muscle jerking or shaking (convulsions) in the mother. Delivering your baby is the best treatment for preeclampsia or eclampsia.  RISK FACTORS The cause of preeclampsia is not known. You may be more likely to develop preeclampsia if you have certain risk factors. These include:   Being pregnant for the first time.  Having preeclampsia in a past pregnancy.  Having a family history of preeclampsia.  Having high blood pressure.  Being pregnant with twins or triplets.  Being 3 or older.  Being African American.  Having kidney disease or diabetes.  Having medical conditions such as lupus or blood diseases.  Being very overweight (obese). SIGNS AND SYMPTOMS  The earliest signs of preeclampsia are:  High blood pressure.  Increased protein in your urine. Your health care provider will check for this at every prenatal visit. Other symptoms that can develop include:   Severe headaches.  Sudden weight gain.  Swelling of your hands, face, legs, and feet.  Feeling sick to your stomach (nauseous) and throwing up (vomiting).  Vision problems (blurred or double vision).  Numbness in your face, arms, legs, and feet.  Dizziness.  Slurred speech.  Sensitivity to bright lights.  Abdominal pain. DIAGNOSIS  There are no screening tests for preeclampsia. Your health care provider will ask you about symptoms and check for signs of  preeclampsia during your prenatal visits. You may also have tests, including:  Urine testing.  Blood testing.  Checking your baby's heart rate.  Checking the health of your baby and your placenta using images created with sound waves (ultrasound). TREATMENT  You can work out the best treatment approach together with your health care provider. It is very important to keep all prenatal appointments. If you have an increased risk of preeclampsia, you may need more frequent prenatal exams.  Your health care provider may prescribe bed rest.  You may have to eat as little salt as possible.  You may need to take medicine to lower your blood pressure if the condition does not respond to more conservative measures.  You may need to stay in the hospital if your condition is severe. There, treatment will focus on controlling your blood pressure and fluid retention. You may also need to take medicine to prevent seizures.  If the condition gets worse, your baby may need to be delivered early to protect you and the baby. You may have your labor started with medicine (be induced), or you may have a cesarean delivery.  Preeclampsia usually goes away after the baby is born. HOME CARE INSTRUCTIONS   Only take over-the-counter or prescription medicines as directed by your health care provider.  Lie on your left side while resting. This keeps pressure off your baby.  Elevate your feet while resting.  Get regular exercise. Ask your health care provider what type of exercise is safe for you.  Avoid caffeine and alcohol.  Do not smoke.  Drink 6-8 glasses of water every day.  Eat a balanced diet  that is low in salt. Do not add salt to your food.  Avoid stressful situations as much as possible.  Get plenty of rest and sleep.  Keep all prenatal appointments and tests as scheduled. SEEK MEDICAL CARE IF:  You are gaining more weight than expected.  You have any headaches, abdominal pain, or  nausea.  You are bruising more than usual.  You feel dizzy or light-headed. SEEK IMMEDIATE MEDICAL CARE IF:   You develop sudden or severe swelling anywhere in your body. This usually happens in the legs.  You gain 5 lb (2.3 kg) or more in a week.  You have a severe headache, dizziness, problems with your vision, or confusion.  You have severe abdominal pain.  You have lasting nausea or vomiting.  You have a seizure.  You have trouble moving any part of your body.  You develop numbness in your body.  You have trouble speaking.  You have any abnormal bleeding.  You develop a stiff neck.  You pass out. MAKE SURE YOU:   Understand these instructions.  Will watch your condition.  Will get help right away if you are not doing well or get worse.   This information is not intended to replace advice given to you by your health care provider. Make sure you discuss any questions you have with your health care provider.   Document Released: 04/22/2000 Document Revised: 04/30/2013 Document Reviewed: 02/15/2013 Elsevier Interactive Patient Education 2016 [employer].  Diabetes Mellitus and Food It is important for you to manage your blood sugar (glucose) level. Your blood glucose level can be greatly affected by what you eat. Eating healthier foods in the appropriate amounts throughout the day at about the same time each day will help you control your blood glucose level. It can also help slow or prevent worsening of your diabetes mellitus. Healthy eating may even help you improve the level of your blood pressure and reach or maintain a healthy weight.  General recommendations for healthful eating and cooking habits include:  Eating meals and snacks regularly. Avoid going long periods of time without eating to lose weight.  Eating a diet that consists mainly of plant-based foods, such as fruits, vegetables, nuts, legumes, and whole grains.  Using low-heat cooking methods, such  as baking, instead of high-heat cooking methods, such as deep frying. Work with your dietitian to make sure you understand how to use the Nutrition Facts information on food labels. HOW CAN FOOD AFFECT ME? Carbohydrates Carbohydrates affect your blood glucose level more than any other type of food. Your dietitian will help you determine how many carbohydrates to eat at each meal and teach you how to count carbohydrates. Counting carbohydrates is important to keep your blood glucose at a healthy level, especially if you are using insulin or taking certain medicines for diabetes mellitus. Alcohol Alcohol can cause sudden decreases in blood glucose (hypoglycemia), especially if you use insulin or take certain medicines for diabetes mellitus. Hypoglycemia can be a life-threatening condition. Symptoms of hypoglycemia (sleepiness, dizziness, and disorientation) are similar to symptoms of having too much alcohol.  If your health care provider has given you approval to drink alcohol, do so in moderation and use the following guidelines:  Women should not have more than one drink per day, and men should not have more than two drinks per day. One drink is equal to:  12 oz of beer.  5 oz of wine.  1 oz of hard liquor.  Do not drink  on an empty stomach.  Keep yourself hydrated. Have water, diet soda, or unsweetened iced tea.  Regular soda, juice, and other mixers might contain a lot of carbohydrates and should be counted. WHAT FOODS ARE NOT RECOMMENDED? As you make food choices, it is important to remember that all foods are not the same. Some foods have fewer nutrients per serving than other foods, even though they might have the same number of calories or carbohydrates. It is difficult to get your body what it needs when you eat foods with fewer nutrients. Examples of foods that you should avoid that are high in calories and carbohydrates but low in nutrients include:  Trans fats (most processed  foods list trans fats on the Nutrition Facts label).  Regular soda.  Juice.  Candy.  Sweets, such as cake, pie, doughnuts, and cookies.  Fried foods. WHAT FOODS CAN I EAT? Eat nutrient-rich foods, which will nourish your body and keep you healthy. The food you should eat also will depend on several factors, including:  The calories you need.  The medicines you take.  Your weight.  Your blood glucose level.  Your blood pressure level.  Your cholesterol level. You should eat a variety of foods, including:  Protein.  Lean cuts of meat.  Proteins low in saturated fats, such as fish, egg whites, and beans. Avoid processed meats.  Fruits and vegetables.  Fruits and vegetables that may help control blood glucose levels, such as apples, mangoes, and yams.  Dairy products.  Choose fat-free or low-fat dairy products, such as milk, yogurt, and cheese.  Grains, bread, pasta, and rice.  Choose whole grain products, such as multigrain bread, whole oats, and brown rice. These foods may help control blood pressure.  Fats.  Foods containing healthful fats, such as nuts, avocado, olive oil, canola oil, and fish. DOES EVERYONE WITH DIABETES MELLITUS HAVE THE SAME MEAL PLAN? Because every person with diabetes mellitus is different, there is not one meal plan that works for everyone. It is very important that you meet with a dietitian who will help you create a meal plan that is just right for you.   This information is not intended to replace advice given to you by your health care provider. Make sure you discuss any questions you have with your health care provider.   Document Released: 01/20/2005 Document Revised: 05/16/2014 Document Reviewed: 03/22/2013 Elsevier Interactive Patient Education 2016 [employer].   Postpartum Care After Vaginal Delivery  After you deliver your newborn (postpartum period), the usual stay in the hospital is 24-72 hours. If there were problems  with your labor or delivery, or if you have other medical problems, you might be in the hospital longer.  While you are in the hospital, you will receive help and instructions on how to care for yourself and your newborn during the postpartum period.  While you are in the hospital:  Be sure to tell your nurses if you have pain or discomfort, as well as where you feel the pain and what makes the pain worse.  If you had an incision made near your vagina (episiotomy) or if you had some tearing during delivery, the nurses may put ice packs on your episiotomy or tear. The ice packs may help to reduce the pain and swelling.  If you are breastfeeding, you may feel uncomfortable contractions of your uterus for a couple of weeks. This is normal. The contractions help your uterus get back to normal size.  It is normal  to have some bleeding after delivery.  For the first 1-3 days after delivery, the flow is red and the amount may be similar to a period.  It is common for the flow to start and stop.  In the first few days, you may pass some small clots. Let your nurses know if you begin to pass large clots or your flow increases.  Do not  flush blood clots down the toilet before having the nurse look at them.  During the next 3-10 days after delivery, your flow should become more watery and pink or brown-tinged in color.  Ten to fourteen days after delivery, your flow should be a small amount of yellowish-white discharge.  The amount of your flow will decrease over the first few weeks after delivery. Your flow may stop in 6-8 weeks. Most women have had their flow stop by 12 weeks after delivery.  You should change your sanitary pads frequently.  Wash your hands thoroughly with soap and water for at least 20 seconds after changing pads, using the toilet, or before holding or feeding your newborn.  You should feel like you need to empty your bladder within the first 6-8 hours after delivery.  In  case you become weak, lightheaded, or faint, call your nurse before you get out of bed for the first time and before you take a shower for the first time.  Within the first few days after delivery, your breasts may begin to feel tender and full. This is called engorgement. Breast tenderness usually goes away within 48-72 hours after engorgement occurs. You may also notice milk leaking from your breasts. If you are not breastfeeding, do not stimulate your breasts. Breast stimulation can make your breasts produce more milk.  Spending as much time as possible with your newborn is very important. During this time, you and your newborn can feel close and get to know each other. Having your newborn stay in your room (rooming in) will help to strengthen the bond with your newborn. It will give you time to get to know your newborn and become comfortable caring for your newborn.  Your hormones change after delivery. Sometimes the hormone changes can temporarily cause you to feel sad or tearful. These feelings should not last more than a few days. If these feelings last longer than that, you should talk to your caregiver.  If desired, talk to your caregiver about methods of family planning or contraception.  Talk to your caregiver about immunizations. Your caregiver may want you to have the following immunizations before leaving the hospital:  Tetanus, diphtheria, and pertussis (Tdap) or tetanus and diphtheria (Td) immunization. It is very important that you and your family (including grandparents) or others caring for your newborn are up-to-date with the Tdap or Td immunizations. The Tdap or Td immunization can help protect your newborn from getting ill.  Rubella immunization.  Varicella (chickenpox) immunization.  Influenza immunization. You should receive this annual immunization if you did not receive the immunization during your pregnancy.   This information is not intended to replace advice given to  you by your health care provider. Make sure you discuss any questions you have with your health care provider.   Document Released: 02/20/2007 Document Revised: 01/18/2012 Document Reviewed: 12/21/2011 Elsevier Interactive Patient Education [employer].

## 2015-12-23 NOTE — Progress Notes (Signed)
Pt CBG 142, pt ate graham crackers just prior to CBG being taken.

## 2015-12-23 NOTE — Progress Notes (Signed)
MD notified of Patient's elevated blood pressures. No new orders at this time.

## 2015-12-24 ENCOUNTER — Other Ambulatory Visit: Payer: BLUE CROSS/BLUE SHIELD | Admitting: Family

## 2015-12-25 ENCOUNTER — Encounter (HOSPITAL_COMMUNITY): Payer: Self-pay | Admitting: *Deleted

## 2016-01-04 ENCOUNTER — Ambulatory Visit: Payer: BLUE CROSS/BLUE SHIELD | Admitting: Obstetrics and Gynecology

## 2016-01-04 ENCOUNTER — Encounter: Payer: Self-pay | Admitting: Obstetrics and Gynecology

## 2016-01-04 VITALS — BP 176/96 | HR 72 | Wt 247.0 lb

## 2016-01-04 DIAGNOSIS — Z01818 Encounter for other preprocedural examination: Secondary | ICD-10-CM

## 2016-01-04 NOTE — Progress Notes (Signed)
   Subjective:    Patient ID: LEON LUSKY, female    DOB: [birth year], 40 y.o.   MRN: [id number]  HPI: Ms. Lohan presents for pre op visit for BTL. She is S/P TSVD on 12/21/15. She has no complaints today. She has had no previous GYN surgery.    Review of Systems  Constitutional: Negative.   Respiratory: Negative.   Cardiovascular: Negative.   Gastrointestinal: Negative.        Objective:   Physical Exam  Constitutional: She appears well-developed and well-nourished.  Cardiovascular: Normal rate and regular rhythm.   Pulmonary/Chest: Effort normal and breath sounds normal.  Abdominal: Soft. Bowel sounds are normal.          Assessment & Plan:  Desire for sterilization. BTL reviewed with pt. Pt instructions provided. Advised to store breast milk for 24-36 hours and to pump and dump for 24 hrs at time of surgery. Pt verbalized understanding. We will schedule BTL.

## 2016-01-04 NOTE — Patient Instructions (Signed)
Patient desires bilateral tubal sterilization.  Other reversible forms of contraception were discussed with patient; she declines all other modalities. Discussed bilateral tubal sterilization in detail; discussed options of laparoscopic bilateral tubal sterilization using Filshie clips vs laparoscopic bilateral salpingectomy. Risks and benefits discussed in detail including but not limited to: risk of regret, permanence of method, bleeding, infection, injury to surrounding organs and need for additional procedures.  Failure risk of 1-2 % for Filshie clips and <1% for bilateral salpingectomy with increased risk of ectopic gestation if pregnancy occurs was also discussed with patient.  Also discussed possible reduction of risk of ovarian cancer via bilateral salpingectomy given that a growing body of knowledge reveals that the majority of cases of high grade serous "ovarian" cancer actually are actually  cancers arising from the fimbriated end of the fallopian tubes. Emphasized that removal of fallopian tubes do not result in any known hormonal imbalance.  Patient verbalized understanding of these risks and benefits and wants to proceed with sterilization with laparoscopic bilateral sterilization using Filshie clips.    She was told that she will be contacted by our surgical scheduler regarding the time and date of her surgery; routine preoperative instructions of having nothing to eat or drink after midnight on the day prior to surgery and also coming to the hospital 1 1/2 hours prior to her time of surgery were also emphasized.  She was told she may be called for a preoperative appointment about a week prior to surgery and will be given further preoperative instructions at that visit.  Routine postoperative instructions will be reviewed with the patient and her family in detail after surgery. Printed patient education handouts about the procedure was given to the patient to review at home.  Medicaid papers have  been signed , patient understands that surgery will be scheduled at least 30 days after the day papers are signed as per Medicaid guidelines.  In the meantime, patient will abstatain for contraception prior to surgery.

## 2016-01-07 ENCOUNTER — Encounter (HOSPITAL_COMMUNITY): Payer: Self-pay | Admitting: *Deleted

## 2016-01-19 ENCOUNTER — Other Ambulatory Visit: Payer: Self-pay | Admitting: Obstetrics and Gynecology

## 2016-01-21 NOTE — Patient Instructions (Signed)
Your procedure is scheduled on:  Monday, Sept. 25, 2017  Enter through the [employer] [facility] at:  8:00 AM  Pick up the phone at the desk and dial [phone].  Call this number if you have problems the morning of surgery: [phone].  Remember: Do NOT eat food or drink after:  Midnight Sunday, Sept. 24, 2017  Take these medicines the morning of surgery with a SIP OF WATER:  Hydrochlorothiazide, Labetalol  Bring Albuterol Inhaler day of surgery  Do NOT take Metformin the evening before surgery  Do NOT wear jewelry (body piercing), metal hair clips/bobby pins, make-up, or nail polish. Do NOT wear lotions, powders, or perfumes.  You may wear deodorant. Do NOT shave for 48 hours prior to surgery. Do NOT bring valuables to the hospital. Contacts, dentures, or bridgework may not be worn into surgery.  Have a responsible adult drive you home and stay with you for 24 hours after your procedure

## 2016-01-22 ENCOUNTER — Encounter (HOSPITAL_COMMUNITY): Payer: Self-pay

## 2016-01-22 ENCOUNTER — Encounter (HOSPITAL_COMMUNITY)
Admission: RE | Admit: 2016-01-22 | Discharge: 2016-01-22 | Disposition: A | Discharge status: Home / Self Care | Payer: BLUE CROSS/BLUE SHIELD | Source: Ambulatory Visit | Attending: Obstetrics and Gynecology | Admitting: Obstetrics and Gynecology

## 2016-01-22 DIAGNOSIS — Z01812 Encounter for preprocedural laboratory examination: Secondary | ICD-10-CM | POA: Diagnosis not present

## 2016-01-22 LAB — BASIC METABOLIC PANEL
ANION GAP: 8 (ref 5–15)
BUN: 13 mg/dL (ref 6–20)
CALCIUM: 8.6 mg/dL — AB (ref 8.9–10.3)
CO2: 28 mmol/L (ref 22–32)
CREATININE: 0.77 mg/dL (ref 0.44–1.00)
Chloride: 102 mmol/L (ref 101–111)
GLUCOSE: 109 mg/dL — AB (ref 65–99)
Potassium: 3.3 mmol/L — ABNORMAL LOW (ref 3.5–5.1)
Sodium: 138 mmol/L (ref 135–145)

## 2016-01-22 LAB — CBC
HEMATOCRIT: 35.2 % — AB (ref 36.0–46.0)
Hemoglobin: 11.1 g/dL — ABNORMAL LOW (ref 12.0–15.0)
MCH: 27.3 pg (ref 26.0–34.0)
MCHC: 31.5 g/dL (ref 30.0–36.0)
MCV: 86.7 fL (ref 78.0–100.0)
PLATELETS: 305 10*3/uL (ref 150–400)
RBC: 4.06 MIL/uL (ref 3.87–5.11)
RDW: 14.8 % (ref 11.5–15.5)
WBC: 11 10*3/uL — ABNORMAL HIGH (ref 4.0–10.5)

## 2016-02-01 ENCOUNTER — Encounter (HOSPITAL_COMMUNITY): Payer: Self-pay | Admitting: Anesthesiology

## 2016-02-01 ENCOUNTER — Ambulatory Visit (HOSPITAL_COMMUNITY): Payer: BLUE CROSS/BLUE SHIELD | Admitting: Anesthesiology

## 2016-02-01 ENCOUNTER — Encounter (HOSPITAL_COMMUNITY)
Admission: RE | Disposition: A | Discharge status: Home / Self Care | Payer: Self-pay | Source: Ambulatory Visit | Attending: Obstetrics and Gynecology

## 2016-02-01 ENCOUNTER — Ambulatory Visit: Payer: BLUE CROSS/BLUE SHIELD | Admitting: Obstetrics and Gynecology

## 2016-02-01 ENCOUNTER — Ambulatory Visit (HOSPITAL_COMMUNITY)
Admission: RE | Admit: 2016-02-01 | Discharge: 2016-02-01 | Disposition: A | Discharge status: Home / Self Care | Payer: BLUE CROSS/BLUE SHIELD | Source: Ambulatory Visit | Attending: Obstetrics and Gynecology | Admitting: Obstetrics and Gynecology

## 2016-02-01 DIAGNOSIS — I10 Essential (primary) hypertension: Secondary | ICD-10-CM | POA: Insufficient documentation

## 2016-02-01 DIAGNOSIS — Z302 Encounter for sterilization: Secondary | ICD-10-CM | POA: Diagnosis not present

## 2016-02-01 DIAGNOSIS — E119 Type 2 diabetes mellitus without complications: Secondary | ICD-10-CM | POA: Insufficient documentation

## 2016-02-01 DIAGNOSIS — Z7984 Long term (current) use of oral hypoglycemic drugs: Secondary | ICD-10-CM | POA: Insufficient documentation

## 2016-02-01 DIAGNOSIS — Z713 Dietary counseling and surveillance: Secondary | ICD-10-CM

## 2016-02-01 HISTORY — PX: LAPAROSCOPIC TUBAL LIGATION: SHX1937

## 2016-02-01 LAB — PREGNANCY, URINE: Preg Test, Ur: NEGATIVE

## 2016-02-01 SURGERY — LIGATION, FALLOPIAN TUBE, LAPAROSCOPIC
Anesthesia: General | Site: Abdomen | Laterality: Bilateral

## 2016-02-01 MED ORDER — ROCURONIUM BROMIDE 100 MG/10ML IV SOLN
INTRAVENOUS | Status: AC
  Filled 2016-02-01: qty 1

## 2016-02-01 MED ORDER — FENTANYL CITRATE (PF) 100 MCG/2ML IJ SOLN
INTRAMUSCULAR | Status: DC | PRN
  Administered 2016-02-01 (×4): 50 ug via INTRAVENOUS

## 2016-02-01 MED ORDER — METOCLOPRAMIDE HCL 5 MG/ML IJ SOLN: 10.0000 mg | Freq: Once | INTRAMUSCULAR | Status: DC | PRN

## 2016-02-01 MED ORDER — KETOROLAC TROMETHAMINE 30 MG/ML IJ SOLN
INTRAMUSCULAR | Status: DC | PRN
  Administered 2016-02-01: 30 mg via INTRAVENOUS

## 2016-02-01 MED ORDER — KETOROLAC TROMETHAMINE 30 MG/ML IJ SOLN
INTRAMUSCULAR | Status: AC
  Filled 2016-02-01: qty 1

## 2016-02-01 MED ORDER — SCOPOLAMINE 1 MG/3DAYS TD PT72
1.0000 | MEDICATED_PATCH | Freq: Once | TRANSDERMAL | Status: DC
  Administered 2016-02-01: 1.5 mg via TRANSDERMAL

## 2016-02-01 MED ORDER — BUPIVACAINE HCL 0.5 % IJ SOLN
INTRAMUSCULAR | Status: DC | PRN
  Administered 2016-02-01: 10 mL

## 2016-02-01 MED ORDER — ONDANSETRON HCL 4 MG/2ML IJ SOLN
INTRAMUSCULAR | Status: AC
  Filled 2016-02-01: qty 2

## 2016-02-01 MED ORDER — DEXAMETHASONE SODIUM PHOSPHATE 4 MG/ML IJ SOLN
INTRAMUSCULAR | Status: DC | PRN
  Administered 2016-02-01: 10 mg via INTRAVENOUS

## 2016-02-01 MED ORDER — LIDOCAINE HCL (CARDIAC) 20 MG/ML IV SOLN
INTRAVENOUS | Status: DC | PRN
  Administered 2016-02-01: 50 mg via INTRAVENOUS

## 2016-02-01 MED ORDER — IBUPROFEN 800 MG PO TABS: 800.0000 mg | ORAL_TABLET | Freq: Three times a day (TID) | ORAL | 0 refills | Status: DC | PRN

## 2016-02-01 MED ORDER — LACTATED RINGERS IV SOLN
INTRAVENOUS | Status: DC
  Administered 2016-02-01 (×3): via INTRAVENOUS

## 2016-02-01 MED ORDER — PROPOFOL 10 MG/ML IV BOLUS
INTRAVENOUS | Status: AC
  Filled 2016-02-01: qty 20

## 2016-02-01 MED ORDER — FENTANYL CITRATE (PF) 100 MCG/2ML IJ SOLN
INTRAMUSCULAR | Status: AC
  Filled 2016-02-01: qty 2

## 2016-02-01 MED ORDER — HYDROMORPHONE HCL 1 MG/ML IJ SOLN: 0.2500 mg | INTRAMUSCULAR | Status: DC | PRN

## 2016-02-01 MED ORDER — ROCURONIUM BROMIDE 100 MG/10ML IV SOLN
INTRAVENOUS | Status: DC | PRN
  Administered 2016-02-01: 40 mg via INTRAVENOUS

## 2016-02-01 MED ORDER — MEPERIDINE HCL 25 MG/ML IJ SOLN: 6.2500 mg | INTRAMUSCULAR | Status: DC | PRN

## 2016-02-01 MED ORDER — GLYCOPYRROLATE 0.2 MG/ML IJ SOLN
INTRAMUSCULAR | Status: AC
  Filled 2016-02-01: qty 1

## 2016-02-01 MED ORDER — MIDAZOLAM HCL 5 MG/5ML IJ SOLN
INTRAMUSCULAR | Status: DC | PRN
  Administered 2016-02-01: 2 mg via INTRAVENOUS

## 2016-02-01 MED ORDER — SUGAMMADEX SODIUM 200 MG/2ML IV SOLN
INTRAVENOUS | Status: DC | PRN
  Administered 2016-02-01: 200 mg via INTRAVENOUS

## 2016-02-01 MED ORDER — MIDAZOLAM HCL 2 MG/2ML IJ SOLN
INTRAMUSCULAR | Status: AC
  Filled 2016-02-01: qty 2

## 2016-02-01 MED ORDER — ONDANSETRON HCL 4 MG/2ML IJ SOLN
INTRAMUSCULAR | Status: DC | PRN
  Administered 2016-02-01: 4 mg via INTRAVENOUS

## 2016-02-01 MED ORDER — PROPOFOL 10 MG/ML IV BOLUS
INTRAVENOUS | Status: DC | PRN
  Administered 2016-02-01: 200 mg via INTRAVENOUS

## 2016-02-01 MED ORDER — DEXTROSE-NACL 5-0.9 % IV SOLN
INTRAVENOUS | Status: DC
Start: 2016-02-01 — End: 2016-02-01

## 2016-02-01 MED ORDER — LIDOCAINE HCL (CARDIAC) 20 MG/ML IV SOLN
INTRAVENOUS | Status: AC
  Filled 2016-02-01: qty 5

## 2016-02-01 MED ORDER — SCOPOLAMINE 1 MG/3DAYS TD PT72
MEDICATED_PATCH | TRANSDERMAL | Status: AC
  Administered 2016-02-01: 1.5 mg via TRANSDERMAL
  Filled 2016-02-01: qty 1

## 2016-02-01 MED ORDER — HYDROCODONE-ACETAMINOPHEN 7.5-325 MG PO TABS: 1.0000 | ORAL_TABLET | Freq: Once | ORAL | Status: DC | PRN

## 2016-02-01 MED ORDER — BUPIVACAINE HCL (PF) 0.5 % IJ SOLN
INTRAMUSCULAR | Status: AC
  Filled 2016-02-01: qty 30

## 2016-02-01 MED ORDER — DEXAMETHASONE SODIUM PHOSPHATE 10 MG/ML IJ SOLN
INTRAMUSCULAR | Status: AC
  Filled 2016-02-01: qty 1

## 2016-02-01 MED ORDER — SUGAMMADEX SODIUM 200 MG/2ML IV SOLN
INTRAVENOUS | Status: AC
  Filled 2016-02-01: qty 2

## 2016-02-01 MED ORDER — HYDROCODONE-ACETAMINOPHEN 5-325 MG PO TABS: 1.0000 | ORAL_TABLET | Freq: Four times a day (QID) | ORAL | 0 refills | Status: DC | PRN

## 2016-02-01 SURGICAL SUPPLY — 24 items
CATH ROBINSON RED A/P 16FR (CATHETERS) ×2 IMPLANT
CLOTH BEACON ORANGE TIMEOUT ST (SAFETY) ×2 IMPLANT
DRSG OPSITE POSTOP 3X4 (GAUZE/BANDAGES/DRESSINGS) ×1 IMPLANT
DURAPREP 26ML APPLICATOR (WOUND CARE) ×2 IMPLANT
GLOVE BIO SURGEON STRL SZ7.5 (GLOVE) ×2 IMPLANT
GLOVE BIOGEL PI IND STRL 7.0 (GLOVE) ×2 IMPLANT
GLOVE BIOGEL PI INDICATOR 7.0 (GLOVE) ×2
GOWN STRL REUS W/TWL LRG LVL3 (GOWN DISPOSABLE) ×2 IMPLANT
GOWN STRL REUS W/TWL XL LVL3 (GOWN DISPOSABLE) ×2 IMPLANT
PACK LAPAROSCOPY BASIN (CUSTOM PROCEDURE TRAY) ×2 IMPLANT
PACK TRENDGUARD 600 HYBRD PROC (MISCELLANEOUS) IMPLANT
PACK WEDGE PROC TRENDGRD 600 (MISCELLANEOUS) IMPLANT
PAD TRENDELENBURG POSITION (MISCELLANEOUS) ×1 IMPLANT
PROTECTOR NERVE ULNAR (MISCELLANEOUS) ×3 IMPLANT
SLEEVE XCEL OPT CAN 5 100 (ENDOMECHANICALS) ×1 IMPLANT
SUT MNCRL AB 4-0 PS2 18 (SUTURE) ×1 IMPLANT
SUT VICRYL 0 UR6 27IN ABS (SUTURE) ×2 IMPLANT
TOWEL OR 17X24 6PK STRL BLUE (TOWEL DISPOSABLE) ×4 IMPLANT
TRENDGRD 600 WEDGE PROC PACK (MISCELLANEOUS) ×2
TRENDGUARD 600 HYBRID PROC PK (MISCELLANEOUS) ×2
TROCAR BALLN 12MMX100 BLUNT (TROCAR) ×2 IMPLANT
TROCAR XCEL NON-BLD 5MMX100MML (ENDOMECHANICALS) ×1 IMPLANT
WARMER LAPAROSCOPE (MISCELLANEOUS) ×2 IMPLANT
WATER STERILE IRR 1000ML POUR (IV SOLUTION) ×2 IMPLANT

## 2016-02-01 NOTE — Op Note (Signed)
Rita Lamb 02/01/2016  PREOPERATIVE DIAGNOSIS:  Undesired fertility  POSTOPERATIVE DIAGNOSIS:  Undesired fertility  PROCEDURE:  Laparoscopic Bilateral Tubal Sterilization using Bipolar Coagulation  ANESTHESIA:  General endotracheal  SURGEON: Michael L. Rip Harbour, MD  COMPLICATIONS:  None immediate.  ESTIMATED BLOOD LOSS:  Less than 20 ml.  FLUIDS: 1000 ml LR.  URINE OUTPUT:  100 ml of clear urine.  INDICATIONS: 40 y.o. [id number]  with undesired fertility, desires permanent sterilization. Other reversible forms of contraception were discussed with patient; she declines all other modalities.  Risks of procedure discussed with patient including permanence of method, bleeding, infection, injury to surrounding organs and need for additional procedures including laparotomy, risk of regret.  Failure risk of 0.5-1% with increased risk of ectopic gestation if pregnancy occurs was also discussed with patient.      FINDINGS:  Normal uterus, tubes, and ovaries.  TECHNIQUE:  The patient was taken to the operating room where general anesthesia was obtained without difficulty.  She was then placed in the dorsal lithotomy position and prepared and draped in sterile fashion.  After an adequate timeout was performed, a bivalved speculum was then placed in the patient's vagina, and the anterior lip of cervix grasped with the single-tooth tenaculum.  The uterine manipulator was then advanced into the uterus.  The speculum was removed from the vagina.  Attention was then turned to the patient's abdomen where a 11-mm skin incision was made in the umbilical fold, after 10 cc 0 .25% plain Marcaine was injected. The fascia was identified and grasped with Kocher's. Mayo scissors were used to cut the fascia.The fascial cornors were secured with 0 Vicryl. Peritenum was grasped and cut. A Huscon 11-mm trocar and sleeve were then advanced without difficulty. The 0 Vircyl was used to secure the trocar.  The abdomen was  then insufflated with carbon dioxide gas and adequate pneumoperitoneum was obtained.  A survey of the patient's pelvis and abdomen revealed entirely normal anatomy.   The fallopian tubes were observed and found to be normal in appearance. Bipolar forceps was then advanced through the operative port and used to coagulate a 3 cm portion of the left tube in the mid isthmic area.  Good blanching and coagulation was noted at the site of the application.  There was no bleeding noted in the mesosalpinx.  A similar process was carried out on the right fallopian tube allowing for bilateral tubal sterilization.   Good hemostasis was noted overall. The instruments were then removed from the abdomen. The fascial incision was repaired with the 0 Vicryl, and the skin was closed with 4/0 Monocryl.  The uterine manipulator and the tenaculum were removed from the vagina without complications. The patient tolerated the procedure well.  Sponge, lap, and needle counts were correct times two.  The patient was then taken to the recovery room awake, extubated and in stable condition.

## 2016-02-01 NOTE — Transfer of Care (Signed)
Immediate Anesthesia Transfer of Care Note  Patient: Rita Lamb  Procedure(s) Performed: Procedure(s): LAPAROSCOPIC Bilateral TUBAL LIGATION by Fulgation (Bilateral)  Patient Location: PACU  Anesthesia Type:General  Level of Consciousness: awake, sedated and patient cooperative  Airway & Oxygen Therapy: Patient Spontanous Breathing and Patient connected to face mask oxygen  Post-op Assessment: Report given to RN and Post -op Vital signs reviewed and stable  Post vital signs: Reviewed and stable  Last Vitals:  Vitals:   02/01/16 0821  BP: 140/70  Pulse: 60  Resp: (!) 22  Temp: 36.9 C    Last Pain:  Vitals:   02/01/16 0821  TempSrc: Oral         Complications: No apparent anesthesia complications

## 2016-02-01 NOTE — Anesthesia Postprocedure Evaluation (Signed)
Anesthesia Post Note  Patient: Rita Lamb  Procedure(s) Performed: Procedure(s) (LRB): LAPAROSCOPIC Bilateral TUBAL LIGATION by Fulgation (Bilateral)  Patient location during evaluation: PACU Anesthesia Type: General Level of consciousness: awake and alert and oriented Pain management: pain level controlled Vital Signs Assessment: post-procedure vital signs reviewed and stable Respiratory status: spontaneous breathing, nonlabored ventilation and respiratory function stable Cardiovascular status: blood pressure returned to baseline and stable Postop Assessment: no signs of nausea or vomiting Anesthetic complications: no     Last Vitals:  Vitals:   02/01/16 1100 02/01/16 1115  BP: (!) 141/80 134/70  Pulse: (!) 52 68  Resp: 19 20  Temp:      Last Pain:  Vitals:   02/01/16 0821  TempSrc: Oral   Pain Goal:                 Lynze Reddy A.

## 2016-02-01 NOTE — Discharge Instructions (Signed)
DISCHARGE INSTRUCTIONS: Laparoscopy  The following instructions have been prepared to help you care for yourself upon your return home today.  Wound care:  Do not get the incision wet for the first 24 hours. The incision should be kept clean and dry.  The Band-Aids or dressings may be removed the day after surgery.  Should the incision become sore, red, and swollen after the first week, check with your doctor.  Personal hygiene:  Shower the day after your procedure.  Activity and limitations:  Do NOT drive or operate any equipment today.  Do NOT lift anything more than 15 pounds for 2-3 weeks after surgery.  Do NOT rest in bed all day.  Walking is encouraged. Walk each day, starting slowly with 5-minute walks 3 or 4 times a day. Slowly increase the length of your walks.  Walk up and down stairs slowly.  Do NOT do strenuous activities, such as golfing, playing tennis, bowling, running, biking, weight lifting, gardening, mowing, or vacuuming for 2-4 weeks. Ask your doctor when it is okay to start.  Diet: Eat a light meal as desired this evening. You may resume your usual diet tomorrow.  Return to work: This is dependent on the type of work you do. For the most part you can return to a desk job within a week of surgery. If you are more active at work, please discuss this with your doctor.  What to expect after your surgery: You may have a slight burning sensation when you urinate on the first day. You may have a very small amount of blood in the urine. Expect to have a small amount of vaginal discharge/light bleeding for 1-2 weeks. It is not unusual to have abdominal soreness and bruising for up to 2 weeks. You may be tired and need more rest for about 1 week. You may experience shoulder pain for 24-72 hours. Lying flat in bed may relieve it.  Call your doctor for any of the following:  Develop a fever of 100.4 or greater  Inability to urinate 6 hours after discharge from  hospital  Severe pain not relieved by pain medications  Persistent of heavy bleeding at incision site  Redness or swelling around incision site after a week  Increasing nausea or vomiting  Patient Signature________________________________________ Nurse Signature_________________________________________Laparoscopic Tubal Ligation Laparoscopic tubal ligation is a procedure that closes the fallopian tubes at a time other than right after childbirth. When the fallopian tubes are closed, the eggs that are released from the ovaries cannot enter the uterus, and sperm cannot reach the egg. Tubal ligation is also known as getting your "tubes tied." Tubal ligation is done so you will not be able to get pregnant or have a baby. Although this procedure may be undone (reversed), it should be considered permanent and irreversible. If you want to have future pregnancies, you should not have this procedure. LET [facility] CARE PROVIDER KNOW ABOUT:  Any allergies you have.  All medicines you are taking, including vitamins, herbs, eye drops, creams, and over-the-counter medicines. This includes any use of steroids, either by mouth or in cream form.  Previous problems you or members of your family have had with the use of anesthetics.  Any blood disorders you have.  Previous surgeries you have had.  Any medical conditions you may have.  Possibility of pregnancy, if this applies.  Any past pregnancies. RISKS AND COMPLICATIONS  Infection.  Bleeding.  Injury to surrounding organs.  Side effects from anesthetics.  Failure of the  procedure.  Ectopic pregnancy.  Future regret about having the procedure done. BEFORE THE PROCEDURE  Ask your health care provider about:  Changing or stopping your regular medicines. This is especially important if you are taking diabetes medicines or blood thinners.  Taking medicines such as aspirin and ibuprofen. These medicines can thin your blood. Do not  take these medicines before your procedure if your health care provider instructs you not to.  Follow instructions from your health care provider about eating and drinking restrictions.  Plan to have someone take you home after the procedure.  If you go home right after the procedure, plan to have someone with you for 24 hours. PROCEDURE  You will be given one or more of the following:  A medicine that helps you relax (sedative).  A medicine that numbs the area (local anesthetic).  A medicine that makes you fall asleep (general anesthetic).  A medicine that is injected into an area of your body that numbs everything below the injection site (regional anesthetic).  If you have been given general anesthetic, a tube will be put down your throat to help you breathe.  Two small cuts (incisions) will be made in the lower abdominal area and near the belly button.  Your bladder may be emptied with a small tube (catheter).  Your abdomen will be inflated with a safe gas (carbon dioxide). This will help to give the surgeon room to operate and visualize, and it will help the surgeon to avoid other organs.  A thin, lighted tube (laparoscope) with a camera attached will be inserted into your abdomen through one of the incisions near the belly button. Other small instruments will be inserted through the other abdominal incision.  The fallopian tubes will be tied off or burned (cauterized), or they will be blocked with a clip, ring, or clamp. In many cases, a small portion in the center of each fallopian tube will also be removed.  After the fallopian tubes are blocked, the gas will be released from the abdomen.  The incisions will be closed with stitches (sutures).  A bandage (dressing) will be placed over the incisions. The procedure may vary among health care providers and hospitals. AFTER THE PROCEDURE  Your blood pressure, heart rate, breathing rate, and blood oxygen level will be  monitored often until the medicines you were given have worn off.  You will be given pain medicine as needed.  If you had general anesthetic, you may have some mild discomfort in your throat. This is from the breathing tube that was placed in your throat while you were sleeping.  You may experience discomfort in the shoulder area from some trapped air between your liver and your diaphragm. This sensation is normal, and it will slowly go away on its own.  You will have some mild abdominal discomfort for 3--7 days.   This information is not intended to replace advice given to you by your health care provider. Make sure you discuss any questions you have with your health care provider.   Document Released: 08/01/2000 Document Revised: 09/09/2014 Document Reviewed: 08/06/2011 Elsevier Interactive Patient Education [employer].

## 2016-02-01 NOTE — Anesthesia Procedure Notes (Signed)
Procedure Name: Intubation Date/Time: 02/01/2016 9:45 AM Performed by: Vernice Jefferson Pre-anesthesia Checklist: Patient identified, Emergency Drugs available, Suction available, Timeout performed and Patient being monitored Patient Re-evaluated:Patient Re-evaluated prior to inductionOxygen Delivery Method: Circle system utilized Preoxygenation: Pre-oxygenation with 100% oxygen Intubation Type: IV induction Ventilation: Mask ventilation without difficulty Laryngoscope Size: Mac and 3 Grade View: Grade III Tube type: Oral Tube size: 7.0 mm Number of attempts: 1 Airway Equipment and Method: Stylet Placement Confirmation: ETT inserted through vocal cords under direct vision,  positive ETCO2 and breath sounds checked- equal and bilateral Secured at: 20 cm Tube secured with: Tape Dental Injury: Teeth and Oropharynx as per pre-operative assessment

## 2016-02-01 NOTE — H&P (Signed)
Rita Lamb is an 40 y.o. female who presents for laparoscopic tubal ligation.   Pertinent Gynecological History: Menses: post partum Contraception: abstinence DES exposure: denies Blood transfusions: none Sexually transmitted diseases: no past history Previous GYN Procedures: pt denies      Menstrual History: Menarche age: 42 No LMP recorded.    Past Medical History:  Diagnosis Date  . Arthritis    bilateral knee  . Asthma   . Bell's palsy   . Chronic hypertension complicating or reason for care during pregnancy 07/23/2015   Guidelines for Antenatal Testing and Sonography  (Revised 07/2012)  INDICATION U/S NST/BPP DELIVERY  CHTN - 642.03   Group I   BP < 140/90, no PIH, AGA,  nml AFV, +/- meds     Group II   BP > 140/90, on meds, no PIH, AGA, nml AFV  [ ]  20-[ ]  28- [ ] 34- [ ] 38  20-24-28-31-34-37  32//2 x wk  28//BPP wkly then 32//2 x wk  40 no meds; 39 meds  FLM or 39  Previous Stillbirth (> 28 wks) - V23.5 20-24-28-32-36 28//BPP wkly then 32//2 x wk 39   Baseline Labs:   AST/ALT 8/8 24 protein:  191    . Gestational diabetes   . Helicobacter pylori (H. pylori) infection   . Hypertension     Past Surgical History:  Procedure Laterality Date  . CRYOTHERAPY  1996   cervix  . TONSILLECTOMY AND ADENOIDECTOMY      Family History  Problem Relation Age of Onset  . Diabetes Father   . Diabetes Sister   . Alcohol abuse Sister   . Colon cancer Paternal Grandfather   . Cancer Paternal Grandfather   . Stroke Paternal Grandfather   . Alcohol abuse Mother   . HIV Mother   . Diabetes Mother   . Dementia Maternal Grandmother   . Cancer Maternal Grandmother 52    colon,   . Cancer Maternal Grandfather   . Diabetes Maternal Grandfather   . Cancer Paternal Grandmother   . Heart disease Paternal Grandmother   . Diabetes Paternal Grandmother     Social History:  reports that she is a non-smoker but has been exposed to tobacco smoke. She has never used smokeless tobacco. She  reports that she does not drink alcohol or use drugs.  Allergies:  Allergies  Allergen Reactions  . Sulfa Antibiotics Rash    Prescriptions Prior to Admission  Medication Sig Dispense Refill Last Dose  . acetaminophen (TYLENOL) 500 MG tablet Take 1-2 tabs every 6 hours for pain. (Patient taking differently: Take 500-1,000 mg by mouth every 6 (six) hours as needed for mild pain. Take 1-2 tabs every 6 hours for pain.) 30 tablet 0 Past Month at Unknown time  . ASPIRIN LOW DOSE 81 MG chewable tablet CHEW AND SWALLOW 1 TABLET(81 MG) BY MOUTH DAILY 30 tablet 0 Past Week at Unknown time  . hydrochlorothiazide (HYDRODIURIL) 25 MG tablet Take 1 tablet (25 mg total) by mouth daily. 30 tablet 3 02/01/2016 at 0620  . labetalol (NORMODYNE) 200 MG tablet Take 200 mg by mouth 2 (two) times daily.   02/01/2016 at 0620  . metFORMIN (GLUCOPHAGE) 500 MG tablet Take 1 tablet (500 mg total) by mouth 2 (two) times daily with a meal. 60 tablet 2 Past Week at Unknown time  . albuterol (PROVENTIL HFA;VENTOLIN HFA) 108 (90 BASE) MCG/ACT inhaler Inhale 2 puffs into the lungs every 6 (six) hours as needed for wheezing or shortness of  breath. Reported on 08/17/2015   Unknown at Unknown time    Review of Systems  Constitutional: Negative.   HENT: Negative.   Respiratory: Negative.   Cardiovascular: Negative.   Gastrointestinal: Negative.   Genitourinary: Negative.     Blood pressure 140/70, pulse 60, temperature 98.4 F (36.9 C), temperature source Oral, resp. rate (!) 22, SpO2 98 %, unknown if currently breastfeeding. Physical Exam  Constitutional: She appears well-developed and well-nourished.  Neck: Normal range of motion. Neck supple.  Cardiovascular: Normal rate and regular rhythm.   Respiratory: Effort normal and breath sounds normal.  GI: Soft. Bowel sounds are normal.    Results for orders placed or performed during the hospital encounter of 02/01/16 (from the past 24 hour(s))  Pregnancy, urine      Status: None   Collection Time: 02/01/16  8:20 AM  Result Value Ref Range   Preg Test, Ur NEGATIVE NEGATIVE    No results found.  Assessment/Plan: Desire for Sterilization  Patient desires bilateral tubal sterilization.  Other reversible forms of contraception were discussed with patient; she declines all other modalities. Discussed bilateral tubal sterilization in detail; discussed options of laparoscopic bilateral tubal sterilization using fulguration vs laparoscopic bilateral salpingectomy. Risks and benefits discussed in detail including but not limited to: risk of regret, permanence of method, bleeding, infection, injury to surrounding organs and need for additional procedures.  Failure risk of 1-2 % for Filshie clips and <1% for bilateral salpingectomy with increased risk of ectopic gestation if pregnancy occurs was also discussed with patient.  Also discussed possible reduction of risk of ovarian cancer via bilateral salpingectomy given that a growing body of knowledge reveals that the majority of cases of high grade serous "ovarian" cancer actually are actually  cancers arising from the fimbriated end of the fallopian tubes. Emphasized that removal of fallopian tubes do not result in any known hormonal imbalance.  Patient verbalized understanding of these risks and benefits and wants to proceed with sterilization with laparoscopic bilateral sterilization using fulguration.    Chancy Milroy 02/01/2016, 9:08 AM

## 2016-02-01 NOTE — Anesthesia Preprocedure Evaluation (Addendum)
Anesthesia Evaluation    Reviewed: Allergy & Precautions, NPO status , Patient's Chart, lab work & pertinent test results  Airway Mallampati: III  TM Distance: >3 FB Neck ROM: Full    Dental  (+) Edentulous Upper, Partial Lower, Poor Dentition,    Pulmonary asthma ,    Pulmonary exam normal breath sounds clear to auscultation       Cardiovascular hypertension, Pt. on medications Normal cardiovascular exam Rhythm:Regular Rate:Normal     Neuro/Psych  Neuromuscular disease negative psych ROS   GI/Hepatic negative GI ROS, Neg liver ROS,   Endo/Other  diabetes, Well Controlled, Type 2, Oral Hypoglycemic AgentsMorbid obesity  Renal/GU negative Renal ROS  negative genitourinary   Musculoskeletal  (+) Arthritis , Osteoarthritis,    Abdominal (+) + obese,   Peds  Hematology  (+) anemia ,   Anesthesia Other Findings   Reproductive/Obstetrics Undesired fertility                            Anesthesia Physical Anesthesia Plan  ASA: III  Anesthesia Plan: General   Post-op Pain Management:    Induction: Intravenous  Airway Management Planned: Oral ETT  Additional Equipment:   Intra-op Plan:   Post-operative Plan: Extubation in OR  Informed Consent: I have reviewed the patients History and Physical, chart, labs and discussed the procedure including the risks, benefits and alternatives for the proposed anesthesia with the patient or authorized representative who has indicated his/her understanding and acceptance.   Dental advisory given  Plan Discussed with: CRNA, Anesthesiologist and Surgeon  Anesthesia Plan Comments:         Anesthesia Quick Evaluation

## 2016-02-02 ENCOUNTER — Encounter (HOSPITAL_COMMUNITY): Payer: Self-pay | Admitting: Obstetrics and Gynecology

## 2016-02-03 ENCOUNTER — Encounter (HOSPITAL_COMMUNITY): Payer: Self-pay | Admitting: Obstetrics and Gynecology

## 2016-02-11 ENCOUNTER — Other Ambulatory Visit: Payer: Self-pay | Admitting: *Deleted

## 2016-02-11 DIAGNOSIS — G8918 Other acute postprocedural pain: Secondary | ICD-10-CM

## 2016-02-11 MED ORDER — IBUPROFEN 800 MG PO TABS: 800.0000 mg | ORAL_TABLET | Freq: Three times a day (TID) | ORAL | 0 refills | Status: DC | PRN

## 2016-02-29 ENCOUNTER — Ambulatory Visit (INDEPENDENT_AMBULATORY_CARE_PROVIDER_SITE_OTHER): Payer: BLUE CROSS/BLUE SHIELD | Admitting: Obstetrics and Gynecology

## 2016-02-29 ENCOUNTER — Encounter: Payer: Self-pay | Admitting: Obstetrics and Gynecology

## 2016-02-29 VITALS — BP 139/77 | HR 71 | Ht 63.0 in | Wt 245.0 lb

## 2016-02-29 DIAGNOSIS — R7302 Impaired glucose tolerance (oral): Secondary | ICD-10-CM

## 2016-02-29 NOTE — Progress Notes (Signed)
Subjective:     Rita Lamb is a 40 y.o. female who presents for a postpartum visit. She is 9 weeks postpartum following a spontaneous vaginal delivery. I have fully reviewed the prenatal and intrapartum course. The delivery was at 37.1 gestational weeks. Outcome: spontaneous vaginal delivery. Anesthesia: epidural. Postpartum course has been unremarkable. Baby's course has been unremarkable. Baby is feeding by both breast and bottle - Similac Advance. Bleeding no bleeding. Bowel function is normal. Bladder function is normal. Patient is sexually active. Contraception method is tubal ligation. Depression/Anxiety screening: negative.  The following portions of the patient's history were reviewed and updated as appropriate: allergies, current medications, past family history, past medical history, past social history and past surgical history.  Review of Systems Pertinent items are noted in HPI.   Objective:    There were no vitals taken for this visit.  General:  alert   Breasts:  deferred  Lungs: clear to auscultation bilaterally  Heart:  regular rate and rhythm, S1, S2 normal, no murmur, click, rub or gallop  Abdomen: soft, non-tender; bowel sounds normal; no masses,  no organomegaly, incision well healed   Vulva:  not evaluated  Vagina: not evaluated  Cervix:  not evaluated  Corpus: not examined  Adnexa:  not evaluated  Rectal Exam: Not performed.        Assessment:     Normal postpartum exam.   Plan:    1. Contraception: tubal ligation 2. Pt to continue with BP meds and make follow up appt with PCP for further management of her HTN. 3. She will d/c her Metformin and return next week for 75 gm glucola 3. Follow up as per above results and or with yearly exam

## 2016-03-07 ENCOUNTER — Other Ambulatory Visit: Payer: BLUE CROSS/BLUE SHIELD

## 2016-03-07 DIAGNOSIS — O99814 Abnormal glucose complicating childbirth: Secondary | ICD-10-CM

## 2016-03-08 LAB — GLUCOSE TOLERANCE, 2 HOURS
GLUCOSE, 2 HOUR: 96 mg/dL (ref <140)
GLUCOSE, FASTING: 97 mg/dL (ref 65–99)

## 2016-03-15 ENCOUNTER — Telehealth: Payer: Self-pay

## 2016-03-15 NOTE — Telephone Encounter (Signed)
Patient has been informed of normal 2 hour gtt results.

## 2016-09-25 IMAGING — US US MFM OB COMPLETE +14 WKS
1 series · 13 of 26 positions shown · non-contrast
Comparison: none

[Series 1: us mfm ob complete +14 wks · 0.17mm/px · 13 of 26 slices shown]
[im 2/26]
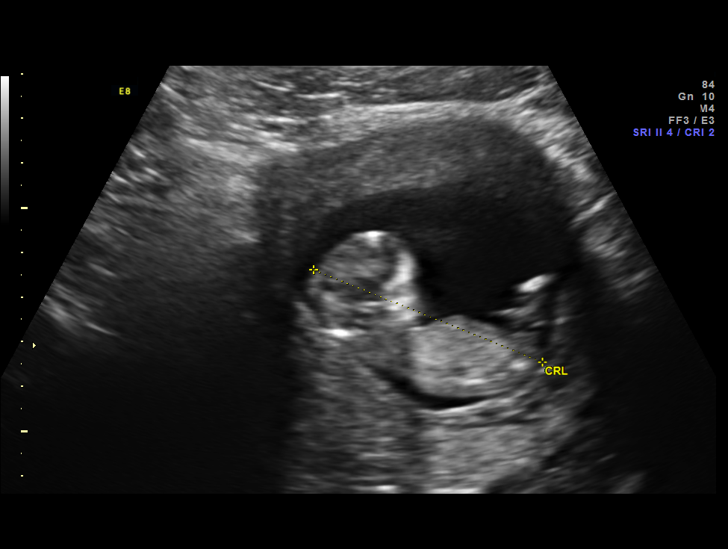
[im 4/26]
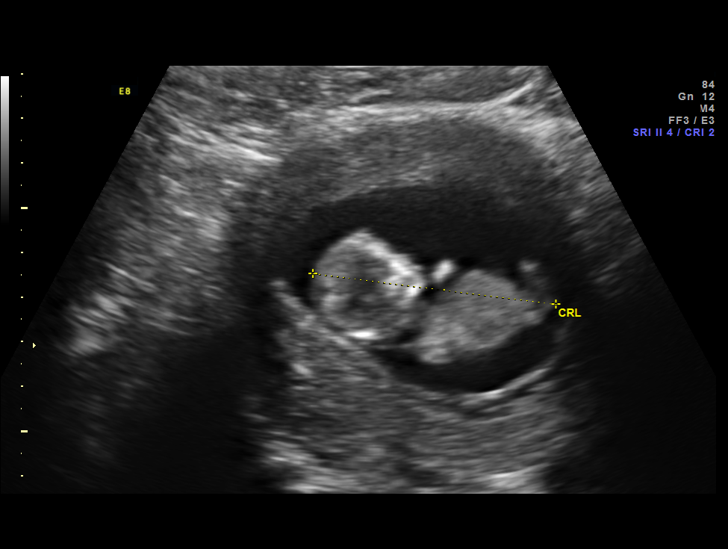
[im 6/26]
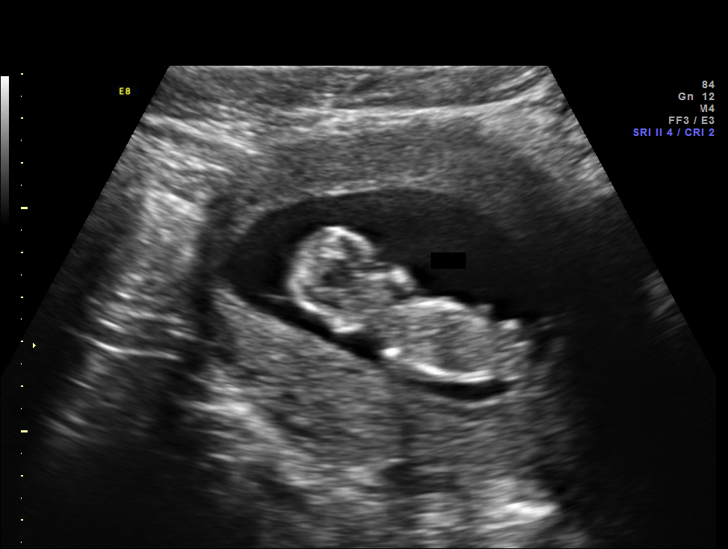
[im 8/26]
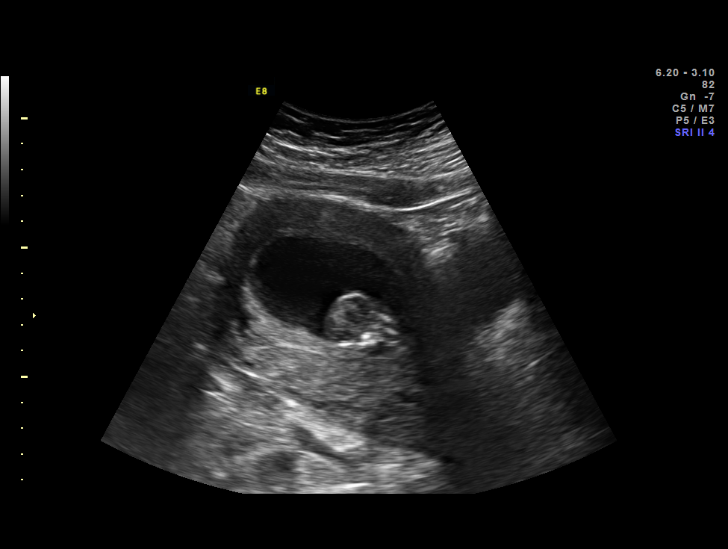
[im 10/26]
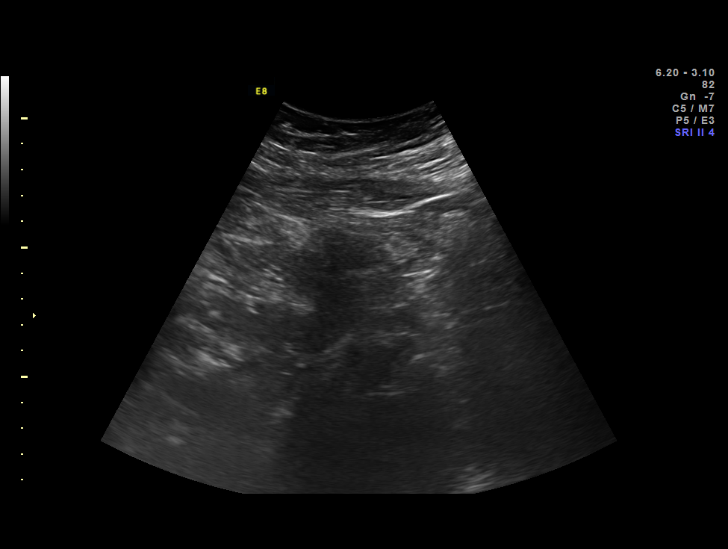
[im 12/26]
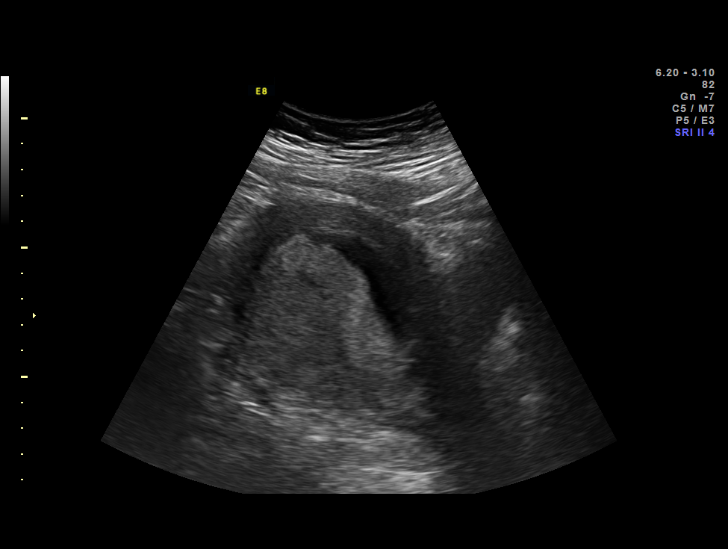
[im 14/26]
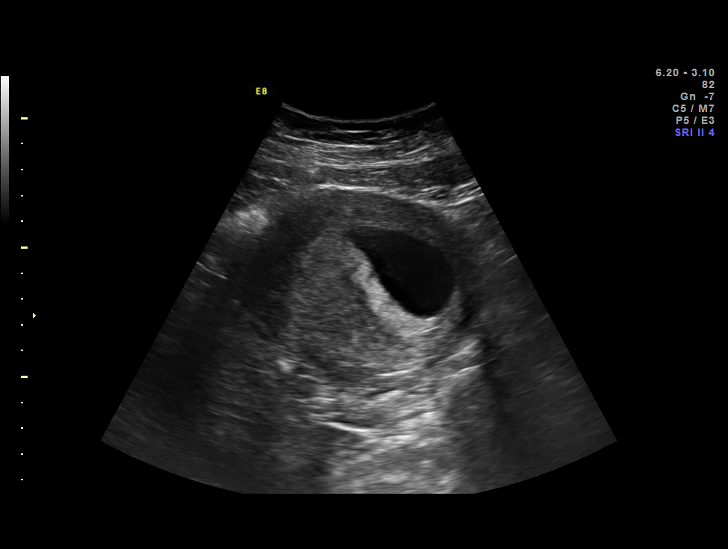
[im 16/26]
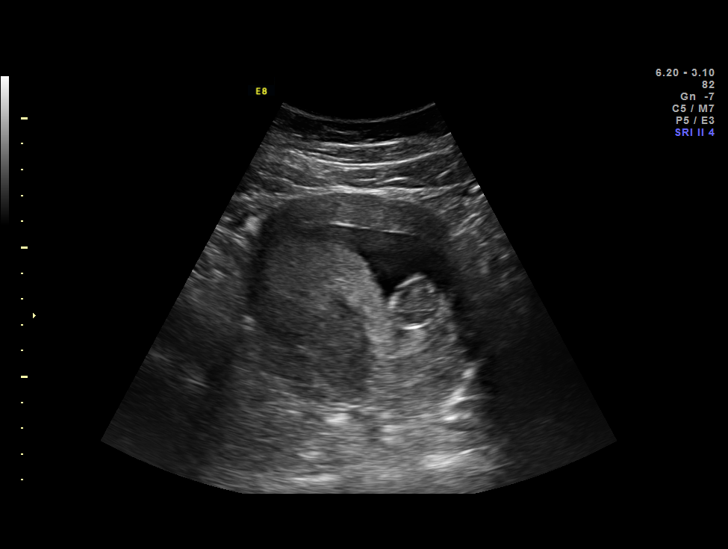
[im 18/26]
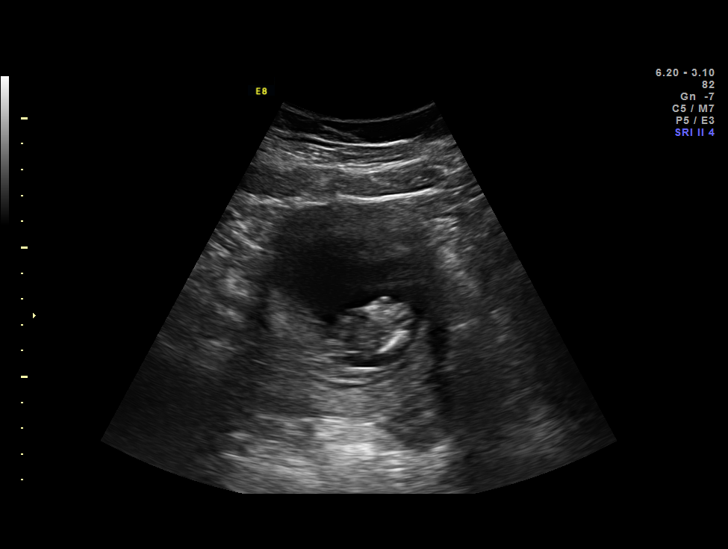
[im 20/26]
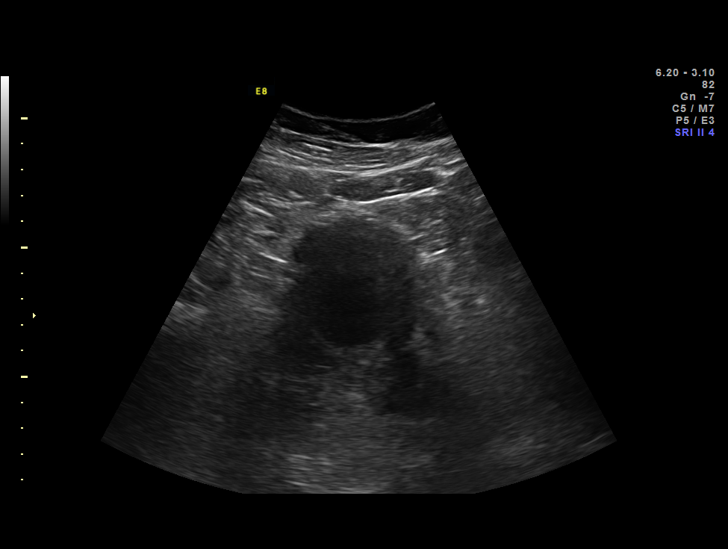
[im 22/26]
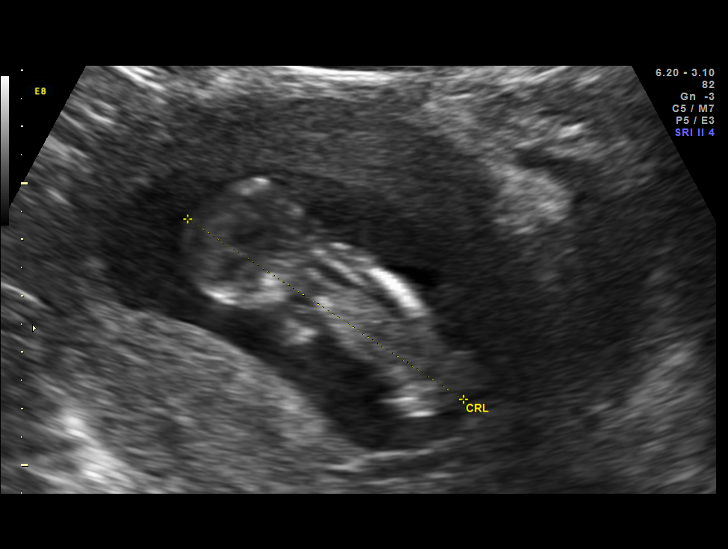
[im 24/26]
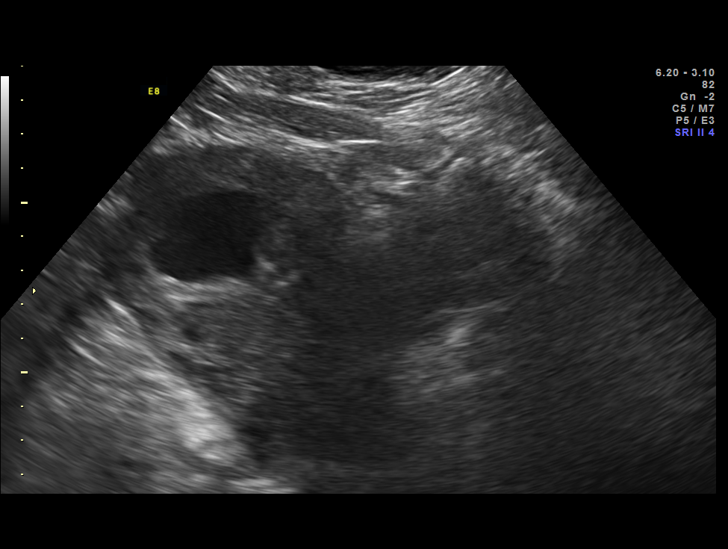
[im 26/26]
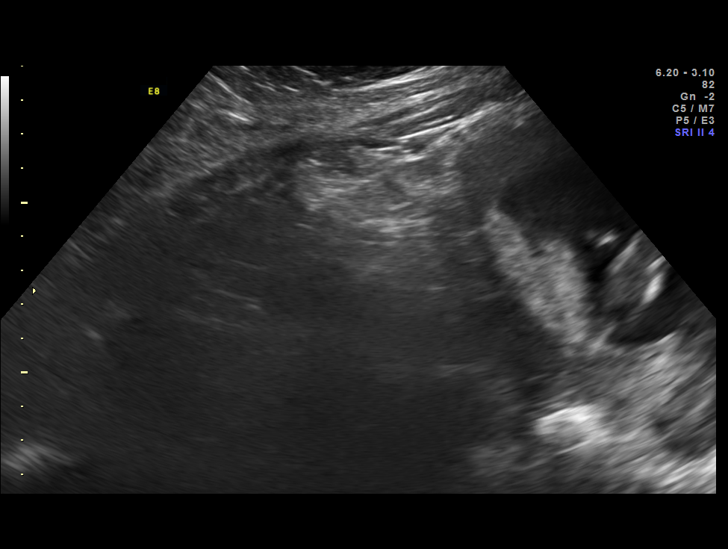

[13 of 26 positions shown; findings below may reference images not displayed]

WEEKS

1  MURILLO                 [id number]      [id number]     [id number]
NOTJERLINE
Indications

Poor obstetric history: Previous gestational
diabetes
12 weeks gestation of pregnancy
Advanced maternal age multigravida 35+,
first trimester
Obesity complicating pregnancy, first
trimester
OB History

Height:       5'3"    Weight:   252        BMI:
Gravidity:    3         Term:   2
Living:       2
Fetal Evaluation

Num Of Fetuses:     1
Fetal Heart         169
Rate(bpm):
Cardiac Activity:   Observed
Presentation:       Variable
Biometry

CRL:      56.9  mm     G. Age:  12w 1d                  EDD:   01/10/16
Gestational Age
LMP:           19w 0d        Date:  02/16/15                 EDD:   11/23/15
Best:          12w 1d     Det. By:  U/S C R L (06/29/15)     EDD:   01/10/16
Cervix Uterus Adnexa

Cervix
Not adaquately visualized

Uterus
No abnormality visualized.

Left Ovary
Not visualized.

Right Ovary
Not visualized.

Adnexa:       No abnormality visualized. No adnexal mass
visualized.
Impression

SIUP at 12+1 weeks
No gross abnormalities identified
Normal amniotic fluid volume
EDC based on today's measurements: 01/10/16

Pt was offered AUAD and first trimester screening - declined
Recommendations

Follow-up ultrasound in 6 weeks for detailed anatomic survey
and genetic counseling (if desired)

## 2017-02-26 ENCOUNTER — Encounter (HOSPITAL_COMMUNITY): Payer: Self-pay | Admitting: Emergency Medicine

## 2017-02-26 ENCOUNTER — Emergency Department (HOSPITAL_COMMUNITY): Payer: Self-pay

## 2017-02-26 ENCOUNTER — Emergency Department (HOSPITAL_COMMUNITY)
Admission: EM | Admit: 2017-02-26 | Discharge: 2017-02-26 | Disposition: A | Discharge status: Home / Self Care | Payer: Self-pay | Attending: Emergency Medicine | Admitting: Emergency Medicine

## 2017-02-26 DIAGNOSIS — Z7722 Contact with and (suspected) exposure to environmental tobacco smoke (acute) (chronic): Secondary | ICD-10-CM | POA: Insufficient documentation

## 2017-02-26 DIAGNOSIS — Z79899 Other long term (current) drug therapy: Secondary | ICD-10-CM | POA: Insufficient documentation

## 2017-02-26 DIAGNOSIS — E119 Type 2 diabetes mellitus without complications: Secondary | ICD-10-CM | POA: Insufficient documentation

## 2017-02-26 DIAGNOSIS — O10012 Pre-existing essential hypertension complicating pregnancy, second trimester: Secondary | ICD-10-CM

## 2017-02-26 DIAGNOSIS — I10 Essential (primary) hypertension: Secondary | ICD-10-CM | POA: Insufficient documentation

## 2017-02-26 DIAGNOSIS — J45909 Unspecified asthma, uncomplicated: Secondary | ICD-10-CM | POA: Insufficient documentation

## 2017-02-26 DIAGNOSIS — R519 Headache, unspecified: Secondary | ICD-10-CM

## 2017-02-26 DIAGNOSIS — R51 Headache: Secondary | ICD-10-CM | POA: Insufficient documentation

## 2017-02-26 DIAGNOSIS — Z7984 Long term (current) use of oral hypoglycemic drugs: Secondary | ICD-10-CM | POA: Insufficient documentation

## 2017-02-26 LAB — BASIC METABOLIC PANEL
Anion gap: 11 (ref 5–15)
BUN: 9 mg/dL (ref 6–20)
CALCIUM: 8.7 mg/dL — AB (ref 8.9–10.3)
CO2: 24 mmol/L (ref 22–32)
Chloride: 102 mmol/L (ref 101–111)
Creatinine, Ser: 0.65 mg/dL (ref 0.44–1.00)
GFR calc non Af Amer: >60 mL/min (ref >60)
Glucose, Bld: 113 mg/dL — ABNORMAL HIGH (ref 65–99)
Potassium: 3.5 mmol/L (ref 3.5–5.1)
SODIUM: 137 mmol/L (ref 135–145)

## 2017-02-26 LAB — CBC WITH DIFFERENTIAL/PLATELET
BASOS PCT: 0 %
Basophils Absolute: 0 10*3/uL (ref 0.0–0.1)
EOS ABS: 0.3 10*3/uL (ref 0.0–0.7)
EOS PCT: 2 %
HCT: 40.6 % (ref 36.0–46.0)
Hemoglobin: 12.6 g/dL (ref 12.0–15.0)
LYMPHS ABS: 3.5 10*3/uL (ref 0.7–4.0)
Lymphocytes Relative: 32 %
MCH: 26.8 pg (ref 26.0–34.0)
MCHC: 31 g/dL (ref 30.0–36.0)
MCV: 86.4 fL (ref 78.0–100.0)
MONOS PCT: 6 %
Monocytes Absolute: 0.6 10*3/uL (ref 0.1–1.0)
Neutro Abs: 6.4 10*3/uL (ref 1.7–7.7)
Neutrophils Relative %: 60 %
PLATELETS: 399 10*3/uL (ref 150–400)
RBC: 4.7 MIL/uL (ref 3.87–5.11)
RDW: 14.4 % (ref 11.5–15.5)
WBC: 10.8 10*3/uL — AB (ref 4.0–10.5)

## 2017-02-26 LAB — I-STAT BETA HCG BLOOD, ED (MC, WL, AP ONLY)

## 2017-02-26 MED ORDER — IOPAMIDOL (ISOVUE-370) INJECTION 76%
100.0000 mL | Freq: Once | INTRAVENOUS | Status: AC | PRN
  Administered 2017-02-26: 75 mL via INTRAVENOUS

## 2017-02-26 MED ORDER — METOCLOPRAMIDE HCL 5 MG/ML IJ SOLN
10.0000 mg | Freq: Once | INTRAMUSCULAR | Status: AC
  Administered 2017-02-26: 10 mg via INTRAVENOUS
  Filled 2017-02-26: qty 2

## 2017-02-26 MED ORDER — HYDROCHLOROTHIAZIDE 25 MG PO TABS: 25.0000 mg | ORAL_TABLET | Freq: Every day | ORAL | 0 refills | Status: DC

## 2017-02-26 MED ORDER — IOPAMIDOL (ISOVUE-370) INJECTION 76%
INTRAVENOUS | Status: AC
  Filled 2017-02-26: qty 100

## 2017-02-26 MED ORDER — DIPHENHYDRAMINE HCL 50 MG/ML IJ SOLN
25.0000 mg | Freq: Once | INTRAMUSCULAR | Status: AC
  Administered 2017-02-26: 25 mg via INTRAVENOUS
  Filled 2017-02-26: qty 1

## 2017-02-26 MED ORDER — IBUPROFEN 800 MG PO TABS: 800.0000 mg | ORAL_TABLET | Freq: Three times a day (TID) | ORAL | 0 refills | Status: DC | PRN

## 2017-02-26 NOTE — ED Provider Notes (Signed)
[facility] DEPT Provider Note   CSN: [id number] Arrival date & time: 02/26/17  1032     History   Chief Complaint Chief Complaint  Patient presents with  . Facial Pain  . Oral Swelling    HPI Rita Lamb is a 41 y.o. female.  The history is provided by the patient. No language interpreter was used.    Rita Lamb is a 41 y.o. female who presents to the Emergency Department complaining of facial pain.  She reports 5 days of intermittent stabbing pain to the left side of her face and neck. Pain initially was treated with BC powders and Tylenol. Pain has been constant since yesterday. She denies any fevers, nausea, vomiting, diarrhea. She does have a history of Bell's palsy 6 years ago. She does have some chronic weakness related to this. She is concerned this is recurrent Bell's palsy but she did not have pain associated with it previously. She does note that there is associated ringing in her left ear. She has a history of hypertension but has not been taking medications for the last 10 months. She denies any headache.   Past Medical History:  Diagnosis Date  . Arthritis    bilateral knee  . Asthma   . Bell's palsy   . Chronic hypertension complicating or reason for care during pregnancy 07/23/2015   Guidelines for Antenatal Testing and Sonography  (Revised 07/2012)  INDICATION U/S NST/BPP DELIVERY  CHTN - 642.03   Group I   BP < 140/90, no PIH, AGA,  nml AFV, +/- meds     Group II   BP > 140/90, on meds, no PIH, AGA, nml AFV  [ ]  20-[ ]  28- [ ] 34- [ ] 38  20-24-28-31-34-37  32//2 x wk  28//BPP wkly then 32//2 x wk  40 no meds; 39 meds  FLM or 39  Previous Stillbirth (> 28 wks) - V23.5 20-24-28-32-36 28//BPP wkly then 32//2 x wk 39   Baseline Labs:   AST/ALT 8/8 24 protein:  191    . Gestational diabetes   . Helicobacter pylori (H. pylori) infection   . Hypertension     Patient Active Problem List   Diagnosis Date Noted  . Postpartum care  and examination 02/29/2016  . Biological false positive RPR test 07/23/2015  . History of abnormal cervical Pap smear 07/23/2015  . Impaired glucose tolerance-last A1c 5.3 02/15/2012  . Hirsutism 02/15/2012  . Hyperlipidemia LDL goal < 100 02/15/2012  . Astigmatism of left eye 12/05/2011  . Asthma, intermittent 10/07/2011  . Obesity-class 3/Morbid 10/07/2011    Past Surgical History:  Procedure Laterality Date  . CRYOTHERAPY  1996   cervix  . LAPAROSCOPIC TUBAL LIGATION Bilateral 02/01/2016   Procedure: LAPAROSCOPIC Bilateral TUBAL LIGATION by Fulgation;  Surgeon: Chancy Milroy, MD;  Location: [facility];  Service: Gynecology;  Laterality: Bilateral;  . TONSILLECTOMY AND ADENOIDECTOMY      OB History    Gravida Para Term Preterm AB Living   3 3 3     3    SAB TAB Ectopic Multiple Live Births         0 1       Home Medications    Prior to Admission medications   Medication Sig Start Date End Date Taking? Authorizing Provider  acetaminophen (TYLENOL) 500 MG tablet Take 1-2 tabs every 6 hours for pain. Patient taking differently: Take 500-1,000 mg by mouth every 6 (six) hours as needed for mild pain.  Take 1-2 tabs every 6 hours for pain. 06/17/15  Yes Myrtis Ser, CNM  Aspirin-Salicylamide-Caffeine (BC FAST PAIN RELIEF) [phone] MG PACK Take 1 Package by mouth daily as needed (headache).   Yes [provider]  ASPIRIN LOW DOSE 81 MG chewable tablet CHEW AND SWALLOW 1 TABLET(81 MG) BY MOUTH DAILY Patient not taking: Reported on 02/26/2017 11/12/15   Gwen Pounds, CNM  hydrochlorothiazide (HYDRODIURIL) 25 MG tablet Take 1 tablet (25 mg total) by mouth daily. 02/26/17   Quintella Reichert, MD  ibuprofen (ADVIL,MOTRIN) 800 MG tablet Take 1 tablet (800 mg total) by mouth every 8 (eight) hours as needed. 02/26/17   Quintella Reichert, MD  metFORMIN (GLUCOPHAGE) 500 MG tablet Take 1 tablet (500 mg total) by mouth 2 (two) times daily with a meal. Patient not taking: Reported on  02/26/2017 12/23/15   [place], Lauralyn Primes, DO    Family History Family History  Problem Relation Age of Onset  . Diabetes Father   . Diabetes Sister   . Alcohol abuse Sister   . Colon cancer Paternal Grandfather   . Cancer Paternal Grandfather   . Stroke Paternal Grandfather   . Alcohol abuse Mother   . HIV Mother   . Diabetes Mother   . Dementia Maternal Grandmother   . Cancer Maternal Grandmother 66       colon,   . Cancer Maternal Grandfather   . Diabetes Maternal Grandfather   . Cancer Paternal Grandmother   . Heart disease Paternal Grandmother   . Diabetes Paternal Grandmother     Social History Social History  Substance Use Topics  . Smoking status: Passive Smoke Exposure - Never Smoker  . Smokeless tobacco: Never Used  . Alcohol use No     Allergies   Sulfa antibiotics   Review of Systems Review of Systems  All other systems reviewed and are negative.    Physical Exam Updated Vital Signs BP (!) 148/97 (BP Location: Left Arm)   Pulse 62   Temp 98.1 F (36.7 C) (Oral)   Resp (!) 24   Wt 112.5 kg (248 lb)   LMP 02/03/2017   SpO2 98%   BMI 43.93 kg/m   Physical Exam  Constitutional: She is oriented to person, place, and time. She appears well-developed and well-nourished.  Tearful and uncomfortable appearing  HENT:  Head: Normocephalic and atraumatic.  Left TM obscured by cerumen. No mastoid tenderness. Poor dentition with multiple carious teeth and multiple teeth broken off at the base. There is no gingival induration or tenderness.  Neck:  Mild left anterior neck tenderness to palpation without any cervical lymphadenopathy.  Cardiovascular: Normal rate and regular rhythm.   No murmur heard. Pulmonary/Chest: Effort normal and breath sounds normal. No respiratory distress.  Abdominal: Soft. There is no tenderness. There is no rebound and no guarding.  Musculoskeletal: She exhibits no edema or tenderness.  Neurological: She is alert and  oriented to person, place, and time.  Left-sided upper and lower facial weakness. Able to fully close the left eye. Pupils equal round and reactive. EOMI. 5 out of 5 strength in all 4 extremities. Sensation to light touch intact in all 4 extremities. Visual fields grossly intact.  Skin: Skin is warm and dry.  Psychiatric: She has a normal mood and affect. Her behavior is normal.  Nursing note and vitals reviewed.    ED Treatments / Results  Labs (all labs ordered are listed, but only abnormal results are displayed) Labs Reviewed  CBC WITH  DIFFERENTIAL/PLATELET - Abnormal; Notable for the following:       Result Value   WBC 10.8 (*)    All other components within normal limits  BASIC METABOLIC PANEL - Abnormal; Notable for the following:    Glucose, Bld 113 (*)    Calcium 8.7 (*)    All other components within normal limits  I-STAT BETA HCG BLOOD, ED ([facility], [facility], [facility] ONLY)    EKG  EKG Interpretation  Date/Time:  Sunday February 26 2017 12:56:57 EDT Ventricular Rate:  62 PR Interval:    QRS Duration: 111 QT Interval:  416 QTC Calculation: 423 R Axis:   15 Text Interpretation:  Sinus rhythm Baseline wander in lead(s) V6 Confirmed by Quintella Reichert [phone]) on 02/26/2017 1:01:40 PM Also confirmed by Quintella Reichert [phone]), editor Drema Pry [phone])  on 02/26/2017 1:19:38 PM       Radiology Ct Angio Head W/cm &/or Wo Cm  Result Date: 02/26/2017 CLINICAL DATA:  Headache.  Left facial pain five days EXAM: CT ANGIOGRAPHY HEAD AND NECK TECHNIQUE: Multidetector CT imaging of the head and neck was performed using the standard protocol during bolus administration of intravenous contrast. Multiplanar CT image reconstructions and MIPs were obtained to evaluate the vascular anatomy. Carotid stenosis measurements (when applicable) are obtained utilizing NASCET criteria, using the distal internal carotid diameter as the denominator. CONTRAST:  100 mL Isovue 370 IV COMPARISON:  None.  FINDINGS: CT HEAD FINDINGS Brain: No evidence of acute infarction, hemorrhage, hydrocephalus, extra-axial collection or mass lesion/mass effect. Vascular: Negative for hyperdense vessel Skull: Negative Sinuses: Negative Orbits: None Review of the MIP images confirms the above findings CTA NECK FINDINGS Aortic arch: Normal Right carotid system: Normal Left carotid system: Normal Vertebral arteries: Normal Skeleton: Normal Other neck: Negative for mass or adenopathy Upper chest: Lung apices clear Review of the MIP images confirms the above findings CTA HEAD FINDINGS Anterior circulation: Cavernous carotid widely patent bilaterally without stenosis or aneurysm. Anterior and middle cerebral artery is normal bilaterally Posterior circulation: Both vertebral artery is patent to the basilar without stenosis. Basilar normal. PICA, AICA, superior cerebellar, and posterior cerebral arteries are widely patent Venous sinuses: Patent Anatomic variants: None Delayed phase: Normal enhancement Review of the MIP images confirms the above findings IMPRESSION: Normal CTA head and neck Electronically Signed   By: Franchot Gallo M.D.   On: 02/26/2017 14:10   Ct Angio Neck W And/or Wo Contrast  Result Date: 02/26/2017 CLINICAL DATA:  Headache.  Left facial pain five days EXAM: CT ANGIOGRAPHY HEAD AND NECK TECHNIQUE: Multidetector CT imaging of the head and neck was performed using the standard protocol during bolus administration of intravenous contrast. Multiplanar CT image reconstructions and MIPs were obtained to evaluate the vascular anatomy. Carotid stenosis measurements (when applicable) are obtained utilizing NASCET criteria, using the distal internal carotid diameter as the denominator. CONTRAST:  100 mL Isovue 370 IV COMPARISON:  None. FINDINGS: CT HEAD FINDINGS Brain: No evidence of acute infarction, hemorrhage, hydrocephalus, extra-axial collection or mass lesion/mass effect. Vascular: Negative for hyperdense vessel  Skull: Negative Sinuses: Negative Orbits: None Review of the MIP images confirms the above findings CTA NECK FINDINGS Aortic arch: Normal Right carotid system: Normal Left carotid system: Normal Vertebral arteries: Normal Skeleton: Normal Other neck: Negative for mass or adenopathy Upper chest: Lung apices clear Review of the MIP images confirms the above findings CTA HEAD FINDINGS Anterior circulation: Cavernous carotid widely patent bilaterally without stenosis or aneurysm. Anterior and middle cerebral artery is normal bilaterally  Posterior circulation: Both vertebral artery is patent to the basilar without stenosis. Basilar normal. PICA, AICA, superior cerebellar, and posterior cerebral arteries are widely patent Venous sinuses: Patent Anatomic variants: None Delayed phase: Normal enhancement Review of the MIP images confirms the above findings IMPRESSION: Normal CTA head and neck Electronically Signed   By: Franchot Gallo M.D.   On: 02/26/2017 14:10    Procedures Procedures (including critical care time)  Medications Ordered in ED Medications  metoCLOPramide (REGLAN) injection 10 mg (10 mg Intravenous Given 02/26/17 1250)  diphenhydrAMINE (BENADRYL) injection 25 mg (25 mg Intravenous Given 02/26/17 1250)  iopamidol (ISOVUE-370) 76 % injection 100 mL (75 mLs Intravenous Contrast Given 02/26/17 1333)     Initial Impression / Assessment and Plan / ED Course  I have reviewed the triage vital signs and the nursing notes.  Pertinent labs & imaging results that were available during my care of the patient were reviewed by me and considered in my medical decision making (see chart for details).     Patient here for evaluation of severe and intermittent left-sided facial, head and neck pain. Patient tearful on initial evaluation with left sided facial weakness. She does have a history of Bell's palsy with chronic left-sided facial weakness. There is no evidence of acute infectious process based on  examination. Presentation is not consistent with subarachnoid hemorrhage. Patient markedly hypertensive in the department but has not been taking her blood pressure medications. Her pain has resolved after treatment with headache cocktail. Imaging is negative for aneurysm or mass. Her blood pressure improved after headache treatment. Presentation is not consistent with meningitis, dural sinus thrombosis, hypertensive urgency. Discussed with patient come care for facial pain/headache, essential hypertension. Discussed outpatient follow-up and return precautions.  Final Clinical Impressions(s) / ED Diagnoses   Final diagnoses:  Essential hypertension  Bad headache  Facial pain    New Prescriptions Discharge Medication List as of 02/26/2017  3:01 PM    START taking these medications   Details  ibuprofen (ADVIL,MOTRIN) 800 MG tablet Take 1 tablet (800 mg total) by mouth every 8 (eight) hours as needed., Starting Sun 02/26/2017, Print         Quintella Reichert, MD 02/26/17 [phone]

## 2017-02-26 NOTE — ED Notes (Signed)
ED Provider at bedside. 

## 2017-02-26 NOTE — ED Notes (Signed)
BP being rechecked on the left arm.  Pt cannot tell this RN the last time she's taken [place].

## 2017-02-26 NOTE — ED Triage Notes (Signed)
Pt with left sided facial pain since Tuesday. Hx of Bells Palsy.

## 2017-03-02 NOTE — Progress Notes (Signed)
   Subjective   Patient ID: Rita Lamb    DOB: [birth year], 41 y.o. female   MRN: [id number]  CC: "Left-sided facial pain"  HPI: Rita Lamb is a 41 y.o. female who presents to clinic today for the following:  Left-sided facial pain: Patient was seen 5 days ago at the ED for left-sided facial pain with onset approximately 2 weeks ago.  She states she has been diagnosed with Bell's palsy 6 years ago after experiencing sudden onset left sided ear pain and was subsequently seen by the neurologist who stated "there is nothing else they could do."  She was concerned she was having an acute worsening of her Bell's palsy due to similar pain in her face radiating down to her left neck.  In the ED, she had a normal CTA of the head and neck and was subsequently given ibuprofen on discharge.  Patient has been controlling pain with BC powder 4 times daily for [redacted] week along with Tylenol and ibuprofen use.  Pain is well controlled at present.  Patient does endorse occasional ear cleanings at the clinic due to "overproduction of earwax."  Hypertension: Patient states she has been noncompliant with her blood pressure medications for over one year.  She states she did not need it because she "felt well."  She has restarted her HCTZ since seeing ED 5 days ago.  She is a non-smoker.  ROS: see HPI for pertinent.  [place]: morbid obesity, HLD, hirsutism, h/o Bells palsy, asthma. Surgical history T&A, tubal, cervical cryo. Family history DM, EtOH abuse. Smoking status reviewed. Medications reviewed.  Objective   BP (!) 170/98   Pulse 82   Temp 98.7 F (37.1 C) (Oral)   Ht 5\' 3"  (1.6 m)   Wt 258 lb 12.8 oz (117.4 kg)   LMP 02/03/2017   SpO2 97%   BMI 45.84 kg/m  Vitals and nursing note reviewed.  General: well nourished, well developed, in no acute distress with non-toxic appearance HEENT: normocephalic, atraumatic, moist mucous membranes, left-sided facial droop with preservation of lid closure, ear  cannal and without erythema or signs of vesicular rash Neck: supple, non-tender without lymphadenopathy CV: regular rate and rhythm without murmurs, rubs, or gallops, no lower extremity edema Lungs: clear to auscultation bilaterally with normal work of breathing Skin: warm, dry, no rashes or lesions, cap refill < 2 seconds Extremities: warm and well perfused, normal tone  Assessment & Plan   Bell's palsy Chronic. Left facial pain could be related to acute on chronic Bells palsy. Unsure etiology given 2 weeks since onset with resolution of symptoms. Outside of window to benefit from prednisone or valtrex. Pain may have been related to cerumen impaction. No signs of Ramsey-Hunt syndrome. Patient able to keep left eyelid closed. -Recommend tylenol PRN for pain  -RTC in 2 weeks if pain persists to consider alternative therapy such as gabapentin  Primary hypertension Chronic. Poorly controlled due to medication non-adherence. Currently on HCTZ and daily ASA. Non-smoker. -Continue HCTZ 25 mg daily and ASA 81 mg daily -RTC 1 month with PCP to consider more aggressive therapy if indicated  No orders of the defined types were placed in this encounter.  No orders of the defined types were placed in this encounter.   Harriet Butte, [place], PGY-2 03/03/2017 3:05 PM

## 2017-03-03 ENCOUNTER — Ambulatory Visit (INDEPENDENT_AMBULATORY_CARE_PROVIDER_SITE_OTHER): Payer: Self-pay | Admitting: Family Medicine

## 2017-03-03 ENCOUNTER — Encounter: Payer: Self-pay | Admitting: Family Medicine

## 2017-03-03 VITALS — BP 170/98 | HR 82 | Temp 98.7°F | Ht 63.0 in | Wt 258.8 lb

## 2017-03-03 DIAGNOSIS — I10 Essential (primary) hypertension: Secondary | ICD-10-CM

## 2017-03-03 DIAGNOSIS — G510 Bell's palsy: Secondary | ICD-10-CM | POA: Insufficient documentation

## 2017-03-03 NOTE — Patient Instructions (Addendum)
Thank you for coming in to see [place] today. Please see below to review our plan for today's visit.  Discontinue pain medication with exception of Tylenol at this time.  You can supplement with ibuprofen if pain is severe.  Please return to the clinic in 2 weeks if pain is not improved. Take blood pressure medications as prescribed.  Follow up with your PCP in 1 month.  Please call the clinic at [phone] if your symptoms worsen or you have any concerns. It was my pleasure to see you. -- Harriet Butte, [place], PGY-2

## 2017-03-03 NOTE — Assessment & Plan Note (Addendum)
Chronic. Poorly controlled due to medication non-adherence. Currently on HCTZ and daily ASA. Non-smoker. -Continue HCTZ 25 mg daily and ASA 81 mg daily -RTC 1 month with PCP to consider more aggressive therapy if indicated

## 2017-03-03 NOTE — Assessment & Plan Note (Addendum)
Chronic. Left facial pain could be related to acute on chronic Bells palsy. Unsure etiology given 2 weeks since onset with resolution of symptoms. Outside of window to benefit from prednisone or valtrex. Pain may have been related to cerumen impaction. No signs of Ramsey-Hunt syndrome. Patient able to keep left eyelid closed. -Recommend tylenol PRN for pain  -RTC in 2 weeks if pain persists to consider alternative therapy such as gabapentin

## 2017-04-02 NOTE — Progress Notes (Signed)
   [facility] Phone: [phone]   Date of Visit: 04/03/2017   HPI:  HTN:  - Medication: HCTZ 25mg  daily - she reports that she has not taken the medication in the past week because she forgot, but did take it this morning.  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  [place]:  PMH: HTN Asthma Bell's Palsy Hirsutism Obesity   PHYSICAL EXAM: BP (!) 158/80 (BP Location: Right Wrist, Patient Position: Sitting, Cuff Size: Normal)   Pulse 71   Temp 98.4 F (36.9 C) (Oral)   Wt 265 lb 12.8 oz (120.6 kg)   LMP 02/06/2017 (Approximate)   SpO2 98%   BMI 47.08 kg/m  GEN: NAD HEENT: neck supple, EOMI, sclera clear, PERRL CV: RRR, no murmurs, rubs, or gallops PULM: CTAB, normal effort ABD: Soft, nontender, nondistended, NABS, no organomegaly SKIN: No rash or cyanosis; warm and well-perfused EXTR: No lower extremity edema or calf tenderness PSYCH: Mood and affect euthymic, normal rate and volume of speech NEURO: Awake, alert, no focal deficits grossly, normal speech  ASSESSMENT/PLAN:  Health maintenance:  - discussed scheduling mammogram  - lipid panel ordered as future order (patient to make lab visit and come in fasting)  - hemoglobin A1c today due to obesity and history of gestational diabetes   Primary hypertension Uncontrolled. Patient not compliant with her HCTZ. Discussed the importance of this. Discussed DASH diet and trying to add some exercise to her schedule. Follow up in 2 weeks.     Smiley Houseman, MD PGY [place]

## 2017-04-03 ENCOUNTER — Ambulatory Visit (INDEPENDENT_AMBULATORY_CARE_PROVIDER_SITE_OTHER): Payer: Self-pay | Admitting: Internal Medicine

## 2017-04-03 ENCOUNTER — Encounter: Payer: Self-pay | Admitting: Internal Medicine

## 2017-04-03 ENCOUNTER — Telehealth: Payer: Self-pay | Admitting: Internal Medicine

## 2017-04-03 VITALS — BP 158/80 | HR 71 | Temp 98.4°F | Wt 265.8 lb

## 2017-04-03 DIAGNOSIS — I10 Essential (primary) hypertension: Secondary | ICD-10-CM

## 2017-04-03 DIAGNOSIS — E7800 Pure hypercholesterolemia, unspecified: Secondary | ICD-10-CM

## 2017-04-03 DIAGNOSIS — O10012 Pre-existing essential hypertension complicating pregnancy, second trimester: Secondary | ICD-10-CM

## 2017-04-03 DIAGNOSIS — R7309 Other abnormal glucose: Secondary | ICD-10-CM

## 2017-04-03 LAB — POCT GLYCOSYLATED HEMOGLOBIN (HGB A1C): HEMOGLOBIN A1C: 5.6

## 2017-04-03 MED ORDER — HYDROCHLOROTHIAZIDE 25 MG PO TABS: 25.0000 mg | ORAL_TABLET | Freq: Every day | ORAL | 1 refills | Status: DC

## 2017-04-03 NOTE — Patient Instructions (Addendum)
Thank you for coming in   Please take your blood pressure medication daily.  Follow up in 2 weeks for blood pressure  Please make a lab visit to get fasting cholesterol checked.  We will check for diabetes today   DASH Eating Plan DASH stands for "Dietary Approaches to Stop Hypertension." The DASH eating plan is a healthy eating plan that has been shown to reduce high blood pressure (hypertension). It may also reduce your risk for type 2 diabetes, heart disease, and stroke. The DASH eating plan may also help with weight loss. What are tips for following this plan? General guidelines  Avoid eating more than 2,300 mg (milligrams) of salt (sodium) a day. If you have hypertension, you may need to reduce your sodium intake to 1,500 mg a day.  Limit alcohol intake to no more than 1 drink a day for nonpregnant women and 2 drinks a day for men. One drink equals 12 oz of beer, 5 oz of wine, or 1 oz of hard liquor.  Work with your health care provider to maintain a healthy body weight or to lose weight. Ask what an ideal weight is for you.  Get at least 30 minutes of exercise that causes your heart to beat faster (aerobic exercise) most days of the week. Activities may include walking, swimming, or biking.  Work with your health care provider or diet and nutrition specialist (dietitian) to adjust your eating plan to your individual calorie needs. Reading food labels  Check food labels for the amount of sodium per serving. Choose foods with less than 5 percent of the Daily Value of sodium. Generally, foods with less than 300 mg of sodium per serving fit into this eating plan.  To find whole grains, look for the word "whole" as the first word in the ingredient list. Shopping  Buy products labeled as "low-sodium" or "no salt added."  Buy fresh foods. Avoid canned foods and premade or frozen meals. Cooking  Avoid adding salt when cooking. Use salt-free seasonings or herbs instead of table salt  or sea salt. Check with your health care provider or pharmacist before using salt substitutes.  Do not fry foods. Cook foods using healthy methods such as baking, boiling, grilling, and broiling instead.  Cook with heart-healthy oils, such as olive, canola, soybean, or sunflower oil. Meal planning   Eat a balanced diet that includes: ? 5 or more servings of fruits and vegetables each day. At each meal, try to fill half of your plate with fruits and vegetables. ? Up to 6-8 servings of whole grains each day. ? Less than 6 oz of lean meat, poultry, or fish each day. A 3-oz serving of meat is about the same size as a deck of cards. One egg equals 1 oz. ? 2 servings of low-fat dairy each day. ? A serving of nuts, seeds, or beans 5 times each week. ? Heart-healthy fats. Healthy fats called Omega-3 fatty acids are found in foods such as flaxseeds and coldwater fish, like sardines, salmon, and mackerel.  Limit how much you eat of the following: ? Canned or prepackaged foods. ? Food that is high in trans fat, such as fried foods. ? Food that is high in saturated fat, such as fatty meat. ? Sweets, desserts, sugary drinks, and other foods with added sugar. ? Full-fat dairy products.  Do not salt foods before eating.  Try to eat at least 2 vegetarian meals each week.  Eat more home-cooked food and less restaurant,  buffet, and fast food.  When eating at a restaurant, ask that your food be prepared with less salt or no salt, if possible. What foods are recommended? The items listed may not be a complete list. Talk with your dietitian about what dietary choices are best for you. Grains Whole-grain or whole-wheat bread. Whole-grain or whole-wheat pasta. Brown rice. [place]. Bulgur. Whole-grain and low-sodium cereals. Pita bread. Low-fat, low-sodium crackers. Whole-wheat flour tortillas. Vegetables Fresh or frozen vegetables (raw, steamed, roasted, or grilled). Low-sodium or reduced-sodium  tomato and vegetable juice. Low-sodium or reduced-sodium tomato sauce and tomato paste. Low-sodium or reduced-sodium canned vegetables. Fruits All fresh, dried, or frozen fruit. Canned fruit in natural juice (without added sugar). Meat and other protein foods Skinless chicken or [place]. Ground chicken or [place]. Pork with fat trimmed off. Fish and seafood. Egg whites. Dried beans, peas, or lentils. Unsalted nuts, nut butters, and seeds. Unsalted canned beans. Lean cuts of beef with fat trimmed off. Low-sodium, lean deli meat. Dairy Low-fat (1%) or fat-free (skim) milk. Fat-free, low-fat, or reduced-fat cheeses. Nonfat, low-sodium ricotta or cottage cheese. Low-fat or nonfat yogurt. Low-fat, low-sodium cheese. Fats and oils Soft margarine without trans fats. Vegetable oil. Low-fat, reduced-fat, or light mayonnaise and salad dressings (reduced-sodium). Canola, safflower, olive, soybean, and sunflower oils. Avocado. Seasoning and other foods Herbs. Spices. Seasoning mixes without salt. Unsalted popcorn and pretzels. Fat-free sweets. What foods are not recommended? The items listed may not be a complete list. Talk with your dietitian about what dietary choices are best for you. Grains Baked goods made with fat, such as croissants, muffins, or some breads. Dry pasta or rice meal packs. Vegetables Creamed or fried vegetables. Vegetables in a cheese sauce. Regular canned vegetables (not low-sodium or reduced-sodium). Regular canned tomato sauce and paste (not low-sodium or reduced-sodium). Regular tomato and vegetable juice (not low-sodium or reduced-sodium). Angie Fava. Olives. Fruits Canned fruit in a light or heavy syrup. Fried fruit. Fruit in cream or butter sauce. Meat and other protein foods Fatty cuts of meat. Ribs. Fried meat. Berniece Salines. Sausage. Bologna and other processed lunch meats. Salami. Fatback. Hotdogs. Bratwurst. Salted nuts and seeds. Canned beans with added salt. Canned or smoked fish.  Whole eggs or egg yolks. Chicken or [place] with skin. Dairy Whole or 2% milk, cream, and half-and-half. Whole or full-fat cream cheese. Whole-fat or sweetened yogurt. Full-fat cheese. Nondairy creamers. Whipped toppings. Processed cheese and cheese spreads. Fats and oils Butter. Stick margarine. Lard. Shortening. Ghee. Bacon fat. Tropical oils, such as coconut, palm kernel, or palm oil. Seasoning and other foods Salted popcorn and pretzels. Onion salt, garlic salt, seasoned salt, table salt, and sea salt. Worcestershire sauce. Tartar sauce. Barbecue sauce. Teriyaki sauce. Soy sauce, including reduced-sodium. Steak sauce. Canned and packaged gravies. Fish sauce. Oyster sauce. Cocktail sauce. Horseradish that you find on the shelf. Ketchup. Mustard. Meat flavorings and tenderizers. Bouillon cubes. Hot sauce and Tabasco sauce. Premade or packaged marinades. Premade or packaged taco seasonings. Relishes. Regular salad dressings. Where to find more information:  National Heart, Lung, and [place]: https://wilson-eaton.com/  American Heart Association: www.heart.org Summary  The DASH eating plan is a healthy eating plan that has been shown to reduce high blood pressure (hypertension). It may also reduce your risk for type 2 diabetes, heart disease, and stroke.  With the DASH eating plan, you should limit salt (sodium) intake to 2,300 mg a day. If you have hypertension, you may need to reduce your sodium intake to 1,500 mg a day.  When  on the DASH eating plan, aim to eat more fresh fruits and vegetables, whole grains, lean proteins, low-fat dairy, and heart-healthy fats.  Work with your health care provider or diet and nutrition specialist (dietitian) to adjust your eating plan to your individual calorie needs. This information is not intended to replace advice given to you by your health care provider. Make sure you discuss any questions you have with your health care provider. Document Released:  04/14/2011 Document Revised: 04/18/2016 Document Reviewed: 04/18/2016 Elsevier Interactive Patient Education  2017 [employer].

## 2017-04-03 NOTE — Assessment & Plan Note (Signed)
Uncontrolled. Patient not compliant with her HCTZ. Discussed the importance of this. Discussed DASH diet and trying to add some exercise to her schedule. Follow up in 2 weeks.

## 2017-04-03 NOTE — Telephone Encounter (Signed)
Rx sent 

## 2017-04-03 NOTE — Telephone Encounter (Signed)
Pt came to office asking for a refill of her [place]. And send the Rx to: [facility] [address], [place], [place]  [id number]. Pt stated that this will be her new preferred pharmacy. [phone]

## 2017-04-05 ENCOUNTER — Other Ambulatory Visit: Payer: Self-pay

## 2017-04-05 DIAGNOSIS — E7800 Pure hypercholesterolemia, unspecified: Secondary | ICD-10-CM

## 2017-04-06 LAB — LIPID PANEL
Chol/HDL Ratio: 4.3 ratio (ref 0.0–4.4)
Cholesterol, Total: 187 mg/dL (ref 100–199)
HDL: 44 mg/dL (ref >39)
LDL CALC: 122 mg/dL — AB (ref 0–99)
TRIGLYCERIDES: 104 mg/dL (ref 0–149)
VLDL Cholesterol Cal: 21 mg/dL (ref 5–40)

## 2017-04-20 ENCOUNTER — Ambulatory Visit: Payer: Self-pay | Admitting: Internal Medicine

## 2017-06-22 DIAGNOSIS — J40 Bronchitis, not specified as acute or chronic: Secondary | ICD-10-CM | POA: Insufficient documentation

## 2017-06-22 DIAGNOSIS — R0602 Shortness of breath: Secondary | ICD-10-CM | POA: Diagnosis present

## 2017-06-23 ENCOUNTER — Other Ambulatory Visit: Payer: Self-pay

## 2017-06-23 ENCOUNTER — Encounter (HOSPITAL_COMMUNITY): Payer: Self-pay | Admitting: Emergency Medicine

## 2017-06-23 ENCOUNTER — Emergency Department (HOSPITAL_COMMUNITY): Payer: BLUE CROSS/BLUE SHIELD

## 2017-06-23 ENCOUNTER — Emergency Department (HOSPITAL_COMMUNITY)
Admission: EM | Admit: 2017-06-23 | Discharge: 2017-06-23 | Disposition: A | Discharge status: Home / Self Care | Payer: BLUE CROSS/BLUE SHIELD | Attending: Emergency Medicine | Admitting: Emergency Medicine

## 2017-06-23 DIAGNOSIS — J40 Bronchitis, not specified as acute or chronic: Secondary | ICD-10-CM

## 2017-06-23 MED ORDER — ALBUTEROL SULFATE (2.5 MG/3ML) 0.083% IN NEBU
5.0000 mg | INHALATION_SOLUTION | Freq: Once | RESPIRATORY_TRACT | Status: AC
  Administered 2017-06-23: 5 mg via RESPIRATORY_TRACT
  Filled 2017-06-23: qty 6

## 2017-06-23 MED ORDER — PREDNISONE 20 MG PO TABS
60.0000 mg | ORAL_TABLET | Freq: Once | ORAL | Status: AC
  Administered 2017-06-23: 60 mg via ORAL
  Filled 2017-06-23: qty 3

## 2017-06-23 MED ORDER — AZITHROMYCIN 250 MG PO TABS
500.0000 mg | ORAL_TABLET | Freq: Once | ORAL | Status: AC
  Administered 2017-06-23: 500 mg via ORAL
  Filled 2017-06-23: qty 2

## 2017-06-23 MED ORDER — AZITHROMYCIN 250 MG PO TABS: 250.0000 mg | ORAL_TABLET | Freq: Every day | ORAL | 0 refills | Status: DC

## 2017-06-23 MED ORDER — PREDNISONE 20 MG PO TABS: 60.0000 mg | ORAL_TABLET | Freq: Every day | ORAL | 0 refills | Status: DC

## 2017-06-23 MED ORDER — IPRATROPIUM BROMIDE 0.02 % IN SOLN
0.5000 mg | Freq: Once | RESPIRATORY_TRACT | Status: AC
  Administered 2017-06-23: 0.5 mg via RESPIRATORY_TRACT
  Filled 2017-06-23: qty 2.5

## 2017-06-23 NOTE — ED Provider Notes (Signed)
[facility] DEPT Provider Note   CSN: [id number] Arrival date & time: 06/22/17  2334     History   Chief Complaint Chief Complaint  Patient presents with  . Shortness of Breath    HPI Rita Lamb is a 42 y.o. female.  Patient presents with wheezing, cough that is intermittently productive and chest tightness for the past 2 weeks. No fever. She is using her inhaler with decreased efficacy. No nausea, vomiting. She has seen her doctor for symptoms and treated symptomatically but is no better.    The history is provided by the patient. No language interpreter was used.  Shortness of Breath  Associated symptoms include cough and wheezing. Pertinent negatives include no fever.    Past Medical History:  Diagnosis Date  . Arthritis    bilateral knee  . Asthma   . Bell's palsy   . Chronic hypertension complicating or reason for care during pregnancy 07/23/2015   Guidelines for Antenatal Testing and Sonography  (Revised 07/2012)  INDICATION U/S NST/BPP DELIVERY  CHTN - 642.03   Group I   BP < 140/90, no PIH, AGA,  nml AFV, +/- meds     Group II   BP > 140/90, on meds, no PIH, AGA, nml AFV  [ ]  20-[ ]  28- [ ] 34- [ ] 38  20-24-28-31-34-37  32//2 x wk  28//BPP wkly then 32//2 x wk  40 no meds; 39 meds  FLM or 39  Previous Stillbirth (> 28 wks) - V23.5 20-24-28-32-36 28//BPP wkly then 32//2 x wk 39   Baseline Labs:   AST/ALT 8/8 24 protein:  191    . Gestational diabetes   . Helicobacter pylori (H. pylori) infection   . Hypertension     Patient Active Problem List   Diagnosis Date Noted  . Bell's palsy 03/03/2017  . Primary hypertension 03/03/2017  . Biological false positive RPR test 07/23/2015  . History of abnormal cervical Pap smear 07/23/2015  . Hirsutism 02/15/2012  . Astigmatism of left eye 12/05/2011  . Asthma, intermittent 10/07/2011  . Morbid obesity with BMI of 45.0-49.9, adult ([place]) 10/07/2011    Past Surgical History:  Procedure  Laterality Date  . CRYOTHERAPY  1996   cervix  . LAPAROSCOPIC TUBAL LIGATION Bilateral 02/01/2016   Procedure: LAPAROSCOPIC Bilateral TUBAL LIGATION by Fulgation;  Surgeon: Chancy Milroy, MD;  Location: [facility];  Service: Gynecology;  Laterality: Bilateral;  . TONSILLECTOMY AND ADENOIDECTOMY      OB History    Gravida Para Term Preterm AB Living   3 3 3     3    SAB TAB Ectopic Multiple Live Births         0 1       Home Medications    Prior to Admission medications   Medication Sig Start Date End Date Taking? Authorizing Provider  albuterol (PROVENTIL HFA;VENTOLIN HFA) 108 (90 Base) MCG/ACT inhaler Inhale 1-2 puffs into the lungs every 6 (six) hours as needed for wheezing or shortness of breath.   Yes [provider]  hydrochlorothiazide (HYDRODIURIL) 25 MG tablet Take 1 tablet (25 mg total) by mouth daily. Patient not taking: Reported on 06/23/2017 04/03/17   Smiley Houseman, MD    Family History Family History  Problem Relation Age of Onset  . Diabetes Father   . Diabetes Sister   . Alcohol abuse Sister   . Colon cancer Paternal Grandfather   . Cancer Paternal Grandfather   . Stroke Paternal Grandfather   .  Alcohol abuse Mother   . HIV Mother   . Diabetes Mother   . Dementia Maternal Grandmother   . Cancer Maternal Grandmother 42       colon,   . Cancer Maternal Grandfather   . Diabetes Maternal Grandfather   . Cancer Paternal Grandmother   . Heart disease Paternal Grandmother   . Diabetes Paternal Grandmother     Social History Social History   Tobacco Use  . Smoking status: Passive Smoke Exposure - Never Smoker  . Smokeless tobacco: Never Used  Substance Use Topics  . Alcohol use: No  . Drug use: No     Allergies   Sulfa antibiotics   Review of Systems Review of Systems  Constitutional: Negative for chills and fever.  HENT: Negative.   Respiratory: Positive for cough, shortness of breath and wheezing.   Cardiovascular: Negative.     Gastrointestinal: Negative.  Negative for nausea.  Musculoskeletal: Negative.  Negative for myalgias.  Skin: Negative.   Neurological: Negative.      Physical Exam Updated Vital Signs BP (!) 167/106 (BP Location: Left Arm)   Pulse 90   Temp 97.8 F (36.6 C) (Oral)   Resp 19   Ht 5\' 3"  (1.6 m)   Wt 113.4 kg (250 lb)   SpO2 100%   BMI 44.29 kg/m   Physical Exam  Constitutional: She appears well-developed and well-nourished.  HENT:  Head: Normocephalic.  Neck: Normal range of motion. Neck supple.  Cardiovascular: Normal rate and regular rhythm.  Pulmonary/Chest: Effort normal and breath sounds normal. She has no wheezes. She has no rhonchi. She has no rales.  She subjective SOB like her asthma.   Abdominal: Soft. Bowel sounds are normal. There is no tenderness. There is no rebound and no guarding.  Musculoskeletal: Normal range of motion.  Neurological: She is alert. No cranial nerve deficit.  Skin: Skin is warm and dry. No rash noted.  Psychiatric: She has a normal mood and affect.     ED Treatments / Results  Labs (all labs ordered are listed, but only abnormal results are displayed) Labs Reviewed - No data to display  EKG  EKG Interpretation None       Radiology Dg Chest 2 View  Result Date: 06/23/2017 CLINICAL DATA:  Shortness of breath.  History of asthma. EXAM: CHEST  2 VIEW COMPARISON:  Chest radiograph February 10, 2015 FINDINGS: The heart size and mediastinal contours are within normal limits. Both lungs are clear. The visualized skeletal structures are unremarkable. IMPRESSION: Negative. Electronically Signed   By: Elon Alas M.D.   On: 06/23/2017 00:40    Procedures Procedures (including critical care time)  Medications Ordered in ED Medications  albuterol (PROVENTIL) (2.5 MG/3ML) 0.083% nebulizer solution 5 mg (5 mg Nebulization Given 06/23/17 0041)  albuterol (PROVENTIL) (2.5 MG/3ML) 0.083% nebulizer solution 5 mg (5 mg Nebulization Given  06/23/17 3810)  ipratropium (ATROVENT) nebulizer solution 0.5 mg (0.5 mg Nebulization Given 06/23/17 1751)  predniSONE (DELTASONE) tablet 60 mg (60 mg Oral Given 06/23/17 0657)  azithromycin (ZITHROMAX) tablet 500 mg (500 mg Oral Given 06/23/17 0657)     Initial Impression / Assessment and Plan / ED Course  I have reviewed the triage vital signs and the nursing notes.  Pertinent labs & imaging results that were available during my care of the patient were reviewed by me and considered in my medical decision making (see chart for details).     Patient here with asthma, wheezing, cough for 2 weeks.  No fever.   Nebulizer provided with improvement in breathing. Will start on prednisone. CXR not showing pneumonia but given duration of symptoms, will provide Rx Z-pack as well.   Final Clinical Impressions(s) / ED Diagnoses   Final diagnoses:  None   1. Bronchitis 2. bronchospasm  ED Discharge Orders    None       Charlann Lange, PA-C 06/23/17 3976    Tegeler, Gwenyth Allegra, MD 06/23/17 [phone]

## 2017-06-23 NOTE — ED Triage Notes (Signed)
Pt states she has asthma and has been having problems with it for the past couple of weeks  Pt states he has had a productive cough for the past week with yellow/green   Pt states it got worse on Sunday  Pt states she is unable to take a deep breath

## 2017-06-27 ENCOUNTER — Encounter: Payer: Self-pay | Admitting: Internal Medicine

## 2017-06-27 ENCOUNTER — Other Ambulatory Visit: Payer: Self-pay

## 2017-06-27 ENCOUNTER — Ambulatory Visit: Payer: BLUE CROSS/BLUE SHIELD | Admitting: Internal Medicine

## 2017-06-27 VITALS — BP 184/112 | HR 66 | Temp 99.2°F | Ht 63.0 in | Wt 264.2 lb

## 2017-06-27 DIAGNOSIS — I10 Essential (primary) hypertension: Secondary | ICD-10-CM | POA: Diagnosis not present

## 2017-06-27 DIAGNOSIS — J209 Acute bronchitis, unspecified: Secondary | ICD-10-CM

## 2017-06-27 DIAGNOSIS — R0602 Shortness of breath: Secondary | ICD-10-CM

## 2017-06-27 MED ORDER — BENZONATATE 200 MG PO CAPS: 200.0000 mg | ORAL_CAPSULE | Freq: Three times a day (TID) | ORAL | 0 refills | Status: DC | PRN

## 2017-06-27 MED ORDER — ALBUTEROL SULFATE (2.5 MG/3ML) 0.083% IN NEBU
2.5000 mg | INHALATION_SOLUTION | Freq: Once | RESPIRATORY_TRACT | Status: AC
  Administered 2017-06-27: 2.5 mg via RESPIRATORY_TRACT

## 2017-06-27 NOTE — Progress Notes (Signed)
   Subjective:    Rita Lamb - 42 y.o. female MRN [id number]  Date of birth: 09/04/1975  HPI  Rita Lamb is female with PMH of asthma here for ED follow up. Was seen on 2/15 with SOB. History of 2 weeks of preceding cough and wheezing. CXR negative for infiltrates or consolidations. Was diagnosed with bronchitis. Treated with Z-pack and Prednisone. Patient reports symptoms have improved; however, she has noted more wheezing today. Cough still persistent and she has been taking OTC cough syrup (unsure of name).   Noted that patient is hypertensive today. She informed that she typically does not take her BP meds on a day to day basis but only fills them when her BP gets high. When in ED, noted that BP was elevated. She resumed taking her HCTZ 4 days ago. She denies headaches, changes in vision, and chest pain.    -  reports that she is a non-smoker but has been exposed to tobacco smoke. she has never used smokeless tobacco. - Review of Systems: Per HPI. - Past Medical History: Patient Active Problem List   Diagnosis Date Noted  . Bell's palsy 03/03/2017  . Primary hypertension 03/03/2017  . Biological false positive RPR test 07/23/2015  . History of abnormal cervical Pap smear 07/23/2015  . Hirsutism 02/15/2012  . Astigmatism of left eye 12/05/2011  . Asthma, intermittent 10/07/2011  . Morbid obesity with BMI of 45.0-49.9, adult ([place]) 10/07/2011   - Medications: reviewed and updated   Objective:   Physical Exam BP (!) 184/112   Pulse 66   Temp 99.2 F (37.3 C) (Oral)   Ht 5\' 3"  (1.6 m)   Wt 264 lb 3.2 oz (119.8 kg)   SpO2 99%   BMI 46.80 kg/m  Gen: NAD, alert, cooperative with exam, well-appearing HEENT: NCAT, PERRL, clear conjunctiva, oropharynx clear, supple neck CV: RRR, good S1/S2, no murmur Resp: coarse breath sounds throughout all lung fields that clear with cough, a few scattered wheezes resolved after albuterol treatment in clinic, good air movement  throughout, comfortable WOB     Assessment & Plan:    Acute bronchitis, unspecified organism Patient appears to be improving per history. She is afebrile and has comfortable WOB on today's exam. Albuterol nebulizer given at patient's request. Few wheezes present on lung exam cleared after breathing treatment. Discussed typical time course for cough with bronchitis. Rx given for Tessalon. Continued use of albuterol prn. Return precautions discussed.   Primary hypertension Today's elevation in BP likely multifactorial from recent non-adherence with BP medication, coughing, and given patient does not remember what type of cough syrup she is taking it may be one that can elevate BP. Encouraged use of Tessalon in place of cough syrup. Discussed continued use of HCTZ. No red flag symptoms at present so appears stable for continued outpatient management. Follow up in 1-2 weeks with PCP for BP check.    Phill Myron, D.O. 06/27/2017, 10:13 AM PGY-3, [place]

## 2017-06-27 NOTE — Patient Instructions (Signed)
I have prescribed Tessalon for your cough. You can continue to use the albuterol every 4 hours as needed for shortness of breath. If your symptoms worsen, you have fevers, or have lots of trouble breathing you need to be seen again.

## 2017-06-28 ENCOUNTER — Ambulatory Visit: Payer: BLUE CROSS/BLUE SHIELD | Admitting: Internal Medicine

## 2017-07-12 ENCOUNTER — Ambulatory Visit: Payer: BLUE CROSS/BLUE SHIELD | Admitting: Internal Medicine

## 2017-07-12 ENCOUNTER — Other Ambulatory Visit: Payer: Self-pay

## 2017-07-12 ENCOUNTER — Encounter: Payer: Self-pay | Admitting: Internal Medicine

## 2017-07-12 ENCOUNTER — Other Ambulatory Visit: Payer: Self-pay | Admitting: Internal Medicine

## 2017-07-12 VITALS — BP 160/92 | HR 75 | Temp 98.2°F | Ht 63.0 in | Wt 265.0 lb

## 2017-07-12 DIAGNOSIS — I10 Essential (primary) hypertension: Secondary | ICD-10-CM

## 2017-07-12 DIAGNOSIS — N926 Irregular menstruation, unspecified: Secondary | ICD-10-CM

## 2017-07-12 DIAGNOSIS — Z139 Encounter for screening, unspecified: Secondary | ICD-10-CM

## 2017-07-12 DIAGNOSIS — Z1239 Encounter for other screening for malignant neoplasm of breast: Secondary | ICD-10-CM

## 2017-07-12 DIAGNOSIS — Z1231 Encounter for screening mammogram for malignant neoplasm of breast: Secondary | ICD-10-CM

## 2017-07-12 NOTE — Patient Instructions (Addendum)
Thank you for coming!  Follow up in 2 weeks for nurse visit to check your blood pressure. Please take your medication daily.   We will get some blood work today to evaluate your irregular periods

## 2017-07-12 NOTE — Assessment & Plan Note (Signed)
Has signs of PCOS with obesity, hirsutism. Will order PCOS lab work to evaluate.

## 2017-07-12 NOTE — Assessment & Plan Note (Signed)
Still slightly above goal. She is not fully compliant with HCTZ. Discussed the importance of this. Discussed regular exercise and decreasing salt intake. Follow up in 2 weeks for nurse visit to check BP

## 2017-07-12 NOTE — Progress Notes (Signed)
   [facility] Phone: [phone]   Date of Visit: 07/12/2017   HPI:  HTN:  - HCTZ 25mg  daily - misses doses about 1-2 times a week  - no chest pain, HA, blurred vision, shortness of day  - active at work but not doing exercise.  - watching her salt intake. Eats a combination of frozen and canned vegetables  - intermittent fast food intake   Irregular Periods:  - reports of irregular periods since starting menses. On average she may have about 6 periods in one year. Started menses at age 42yo.  - has not bee worked up for PCOS - she does have central obesity and hirsutism     ROS: See HPI.  [place]:  PMH: HTN Asthma Bell's Palsy Obesity Hirsutism   PHYSICAL EXAM: BP (!) 160/92 Comment: pt has daughter with her and unable to get her calm  Pulse 75   Temp 98.2 F (36.8 C) (Oral)   Ht 5\' 3"  (1.6 m)   Wt 265 lb (120.2 kg)   LMP 07/03/2017 (Approximate)   SpO2 98%   Breastfeeding? No   BMI 46.94 kg/m  GEN: NAD, central obesity, hirsutism  CV: RRR, no murmurs, rubs, or gallops PULM: CTAB, normal effort SKIN: No rash or cyanosis; warm and well-perfused EXTR: No lower extremity edema or calf tenderness PSYCH: Mood and affect euthymic, normal rate and volume of speech NEURO: Awake, alert, no focal deficits grossly, normal speech  ASSESSMENT/PLAN:  Health maintenance:  - mammogram information given  Primary hypertension Still slightly above goal. She is not fully compliant with HCTZ. Discussed the importance of this. Discussed regular exercise and decreasing salt intake. Follow up in 2 weeks for nurse visit to check BP   Irregular periods Has signs of PCOS with obesity, hirsutism. Will order PCOS lab work to evaluate.   Smiley Houseman, MD PGY [place]

## 2017-07-13 LAB — TSH: TSH: 1.35 u[IU]/mL (ref 0.450–4.500)

## 2017-07-13 LAB — PROLACTIN: Prolactin: 9.4 ng/mL (ref 4.8–23.3)

## 2017-07-13 LAB — FOLLICLE STIMULATING HORMONE: FSH: 5.6 m[IU]/mL

## 2017-07-13 LAB — LUTEINIZING HORMONE: LH: 6 m[IU]/mL

## 2017-07-14 LAB — TESTOSTERONE, FREE, TOTAL, SHBG
SEX HORMONE BINDING: 43.5 nmol/L (ref 24.6–122.0)
TESTOSTERONE FREE: 1.7 pg/mL (ref 0.0–4.2)
TESTOSTERONE: 28 ng/dL (ref 8–48)

## 2017-07-20 ENCOUNTER — Telehealth: Payer: Self-pay | Admitting: Internal Medicine

## 2017-07-20 MED ORDER — NORETHINDRONE 0.35 MG PO TABS: 1.0000 | ORAL_TABLET | Freq: Every day | ORAL | 11 refills | Status: DC

## 2017-07-20 NOTE — Telephone Encounter (Signed)
Called patient to inform of unremarkable lab work. Clinically, she does meet criteria for PCOS. Discussed starting norethindrone for endometrial protection. Patient agreeable.

## 2017-07-26 ENCOUNTER — Ambulatory Visit (INDEPENDENT_AMBULATORY_CARE_PROVIDER_SITE_OTHER): Payer: BLUE CROSS/BLUE SHIELD

## 2017-07-26 VITALS — BP 152/84 | HR 84

## 2017-07-26 DIAGNOSIS — I10 Essential (primary) hypertension: Secondary | ICD-10-CM

## 2017-07-26 NOTE — Progress Notes (Signed)
Patient here today for BP check. BP today is 152/84. Checked BP in left arm with large cuff.  Patient last took BP med today at 0900. States she misses a dose about 2-3 times a week. Denies any sx today. Routed note to PCP.    Wallace Cullens, RN

## 2017-08-01 ENCOUNTER — Ambulatory Visit
Admission: RE | Admit: 2017-08-01 | Discharge: 2017-08-01 | Disposition: A | Discharge status: Home / Self Care | Payer: BLUE CROSS/BLUE SHIELD | Source: Ambulatory Visit | Attending: Family Medicine | Admitting: Family Medicine

## 2017-08-01 DIAGNOSIS — Z139 Encounter for screening, unspecified: Secondary | ICD-10-CM

## 2017-08-22 ENCOUNTER — Other Ambulatory Visit: Payer: Self-pay | Admitting: *Deleted

## 2017-08-22 DIAGNOSIS — O10012 Pre-existing essential hypertension complicating pregnancy, second trimester: Secondary | ICD-10-CM

## 2017-08-22 MED ORDER — HYDROCHLOROTHIAZIDE 25 MG PO TABS: 25.0000 mg | ORAL_TABLET | Freq: Every day | ORAL | 1 refills | Status: DC

## 2017-12-15 ENCOUNTER — Other Ambulatory Visit: Payer: Self-pay | Admitting: Internal Medicine

## 2017-12-15 DIAGNOSIS — O10012 Pre-existing essential hypertension complicating pregnancy, second trimester: Secondary | ICD-10-CM

## 2018-01-22 ENCOUNTER — Telehealth: Payer: Self-pay | Admitting: Family Medicine

## 2018-01-22 NOTE — Telephone Encounter (Signed)
**  After Hours/ Emergency Line Call*  Received a call to report that Rita Lamb has noticed a hernia in her belly button. There is tenderness around her belly button and it "feels weird". She has pain that is irritating. She is able to push the bulge back in. She had a BTL 2 years ago.  Recommended that she be evaluated by her PCP to discuss next steps.  Red flags discussed.  Will forward to PCP.  Steve Rattler, DO PGY-2, [facility]

## 2018-01-31 ENCOUNTER — Ambulatory Visit (INDEPENDENT_AMBULATORY_CARE_PROVIDER_SITE_OTHER): Payer: BLUE CROSS/BLUE SHIELD | Admitting: Family Medicine

## 2018-01-31 ENCOUNTER — Encounter: Payer: Self-pay | Admitting: Family Medicine

## 2018-01-31 ENCOUNTER — Other Ambulatory Visit: Payer: Self-pay

## 2018-01-31 VITALS — BP 132/98 | HR 74 | Temp 98.5°F | Ht 63.0 in | Wt 256.0 lb

## 2018-01-31 DIAGNOSIS — K429 Umbilical hernia without obstruction or gangrene: Secondary | ICD-10-CM | POA: Insufficient documentation

## 2018-01-31 DIAGNOSIS — I10 Essential (primary) hypertension: Secondary | ICD-10-CM

## 2018-01-31 DIAGNOSIS — H6123 Impacted cerumen, bilateral: Secondary | ICD-10-CM | POA: Diagnosis not present

## 2018-01-31 MED ORDER — CARBAMIDE PEROXIDE 6.5 % OT SOLN: 10.0000 [drp] | Freq: Two times a day (BID) | OTIC | 0 refills | Status: DC

## 2018-01-31 MED ORDER — HYDROCHLOROTHIAZIDE 25 MG PO TABS: 25.0000 mg | ORAL_TABLET | Freq: Every day | ORAL | 3 refills | Status: DC

## 2018-01-31 NOTE — Progress Notes (Signed)
    Subjective:  Rita Lamb is a 42 y.o. female who presents to the [facility] today with a chief complaint of umbilical hernia.   HPI:  Umbilical Hernia Onset: Recently noticed, with abdominal pain on 01/22/18. H/o BTL 2 yrs ago, went through the umbilicus laproscopically Location/radiation: Umbicus Duration: Past two weeks?  Character: Reducible at home, painless now Aggrevating factors: actively poking belly button Reliving factors: Went away with time Timing: N/A Severity: 3/10  Cerumen impaction bilaterally History of bilat cerumen impaction. Usually debrided annually. Ears feel "clogged". Hearing sounds muffled. No headaches. No dizziness.   ROS: Per HPI  PMH: Smoking history reviewed.    Objective:  Physical Exam: BP (!) 132/98   Pulse 74   Temp 98.5 F (36.9 C) (Oral)   Ht 5\' 3"  (1.6 m)   Wt 256 lb (116.1 kg)   SpO2 98%   BMI 45.35 kg/m   Gen: NAD, resting comfortably Ears: Cerumen impaction bilaterally Abdomen: Umbilical hernia, small easily reducible, mildly tender to palpation MSK: no edema, cyanosis, or clubbing noted Skin: warm, dry Neuro: grossly normal, moves all extremities Psych: Normal affect and thought content  No results found for this or any previous visit (from the past 72 hour(s)).   Assessment/Plan:  Essential hypertension Slightly above goal today with BP 132/98.  Patient regular on the HCTZ.   - Have patient return in 1 month for BP check  Bilateral impacted cerumen Chronic bilateral cerumen impaction. -Bilateral lavage in office -Debrox as needed as prescribed  Umbilical hernia without obstruction and without gangrene Small.  Easily reducible.  Discussed symptoms associate with strangulation of the bowel and that would require urgent treatment.  Patient acknowledged this and understood.Patient wishes to watch and wait at this time. -Monitor clinically as needed   Lab Orders  No laboratory test(s) ordered today    Meds  ordered this encounter  Medications  . carbamide peroxide (DEBROX) 6.5 % OTIC solution    Sig: Place 10 drops into both ears 2 (two) times daily.    Dispense:  15 mL    Refill:  0  . hydrochlorothiazide (HYDRODIURIL) 25 MG tablet    Sig: Take 1 tablet (25 mg total) by mouth daily.    Dispense:  90 tablet    Refill:  3    Please consider 90 day supplies to promote better adherence    Marny Lowenstein, MD, [place] - PGY2 01/31/2018 12:22 PM

## 2018-01-31 NOTE — Assessment & Plan Note (Addendum)
Slightly above goal today with BP 132/98.  Patient regular on the HCTZ.   - Have patient return in 1 month for BP check

## 2018-01-31 NOTE — Patient Instructions (Signed)
Umbilical Hernia, Adult A hernia is a bulge of tissue that pushes through an opening between muscles. An umbilical hernia happens in the abdomen, near the belly button (umbilicus). The hernia may contain tissues from the small intestine, large intestine, or fatty tissue covering the intestines (omentum). Umbilical hernias in adults tend to get worse over time, and they require surgical treatment. There are several types of umbilical hernias. You may have:  A hernia located just above or below the umbilicus (indirect hernia). This is the most common type of umbilical hernia in adults.  A hernia that forms through an opening formed by the umbilicus (direct hernia).  A hernia that comes and goes (reducible hernia). A reducible hernia may be visible only when you strain, lift something heavy, or cough. This type of hernia can be pushed back into the abdomen (reduced).  A hernia that traps abdominal tissue inside the hernia (incarcerated hernia). This type of hernia cannot be reduced.  A hernia that cuts off blood flow to the tissues inside the hernia (strangulated hernia). The tissues can start to die if this happens. This type of hernia requires emergency treatment.  What are the causes? An umbilical hernia happens when tissue inside the abdomen presses on a weak area of the abdominal muscles. What increases the risk? You may have a greater risk of this condition if you:  Are obese.  Have had several pregnancies.  Have a buildup of fluid inside your abdomen (ascites).  Have had surgery that weakens the abdominal muscles.  What are the signs or symptoms? The main symptom of this condition is a painless bulge at or near the belly button. A reducible hernia may be visible only when you strain, lift something heavy, or cough. Other symptoms may include:  Dull pain.  A feeling of pressure.  Symptoms of a strangulated hernia may include:  Pain that gets increasingly worse.  Nausea and  vomiting.  Pain when pressing on the hernia.  Skin over the hernia becoming red or purple.  Constipation.  Blood in the stool.  How is this diagnosed? This condition may be diagnosed based on:  A physical exam. You may be asked to cough or strain while standing. These actions increase the pressure inside your abdomen and force the hernia through the opening in your muscles. Your health care provider may try to reduce the hernia by pressing on it.  Your symptoms and medical history.  How is this treated? Surgery is the only treatment for an umbilical hernia. Surgery for a strangulated hernia is done as soon as possible. If you have a small hernia that is not incarcerated, you may need to lose weight before having surgery. Follow these instructions at home:  Lose weight, if told by your health care provider.  Do not try to push the hernia back in.  Watch your hernia for any changes in color or size. Tell your health care provider if any changes occur.  You may need to avoid activities that increase pressure on your hernia.  Do not lift anything that is heavier than 10 lb (4.5 kg) until your health care provider says that this is safe.  Take over-the-counter and prescription medicines only as told by your health care provider.  Keep all follow-up visits as told by your health care provider. This is important. Contact a health care provider if:  Your hernia gets larger.  Your hernia becomes painful. Get help right away if:  You develop sudden, severe pain near the   area of your hernia.  You have pain as well as nausea or vomiting.  You have pain and the skin over your hernia changes color.  You develop a fever. This information is not intended to replace advice given to you by your health care provider. Make sure you discuss any questions you have with your health care provider. Document Released: 09/25/2015 Document Revised: 12/27/2015 Document Reviewed:  09/25/2015 Elsevier Interactive Patient Education  2018 [employer]vier Inc.  

## 2018-01-31 NOTE — Assessment & Plan Note (Signed)
Small.  Easily reducible.  Discussed symptoms associate with strangulation of the bowel and that would require urgent treatment.  Patient acknowledged this and understood.Patient wishes to watch and wait at this time. -Monitor clinically as needed

## 2018-01-31 NOTE — Assessment & Plan Note (Signed)
Chronic bilateral cerumen impaction. -Bilateral lavage in office -Debrox as needed as prescribed

## 2018-03-02 ENCOUNTER — Ambulatory Visit: Payer: BLUE CROSS/BLUE SHIELD | Admitting: Family Medicine

## 2018-03-02 ENCOUNTER — Encounter: Payer: Self-pay | Admitting: Family Medicine

## 2018-03-02 ENCOUNTER — Other Ambulatory Visit: Payer: Self-pay

## 2018-03-02 VITALS — BP 140/90 | HR 72 | Temp 98.3°F | Ht 63.0 in | Wt 258.0 lb

## 2018-03-02 DIAGNOSIS — N939 Abnormal uterine and vaginal bleeding, unspecified: Secondary | ICD-10-CM | POA: Diagnosis not present

## 2018-03-02 DIAGNOSIS — E282 Polycystic ovarian syndrome: Secondary | ICD-10-CM

## 2018-03-02 DIAGNOSIS — I10 Essential (primary) hypertension: Secondary | ICD-10-CM | POA: Diagnosis not present

## 2018-03-02 DIAGNOSIS — Z30013 Encounter for initial prescription of injectable contraceptive: Secondary | ICD-10-CM | POA: Diagnosis not present

## 2018-03-02 DIAGNOSIS — L680 Hirsutism: Secondary | ICD-10-CM

## 2018-03-02 DIAGNOSIS — Z6841 Body Mass Index (BMI) 40.0 and over, adult: Secondary | ICD-10-CM

## 2018-03-02 LAB — POCT URINE PREGNANCY: Preg Test, Ur: NEGATIVE

## 2018-03-02 MED ORDER — SPIRONOLACTONE 25 MG PO TABS: 25.0000 mg | ORAL_TABLET | Freq: Every day | ORAL | 1 refills | Status: DC

## 2018-03-02 MED ORDER — MEDROXYPROGESTERONE ACETATE 150 MG/ML IM SUSP: 150.0000 mg | Freq: Once | INTRAMUSCULAR | Status: DC

## 2018-03-02 MED ORDER — MEDROXYPROGESTERONE ACETATE 150 MG/ML IM SUSY
150.0000 mg | PREFILLED_SYRINGE | Freq: Once | INTRAMUSCULAR | Status: AC
  Administered 2018-03-02: 150 mg via INTRAMUSCULAR

## 2018-03-02 NOTE — Assessment & Plan Note (Signed)
Presumed PCOS, although testosterone levels were within normal limits.  Will trial spironolactone 25 mg daily.  Patient follow-up in 1 month.

## 2018-03-02 NOTE — Patient Instructions (Signed)
It was a pleasure to see you today! Thank you for choosing [facility] for your primary care. Rita Lamb was seen for abnormal uterine bleeding and high blood pressure.   1. For the Irregular periods (Abnormal uterine bleeding),  - Go get an ultrasound to uterine cancer - Stop the Progesterone pills - We are starting Depo shots again - We are referring you to OB/GYN. You will be called with an appointment  2. For your facial hair/PCOS - We are starting a new medicine called spironolactone. This can help you with you blood pressure too! - We are getting labs to check you kidneys and blood counts - We are referring you to Nutrition to help with weightloss and healthily lifestyle associated with PCOS syndromes.   Best,  Marny Lowenstein, MD, MS FAMILY MEDICINE RESIDENT - PGY1 03/02/2018 10:10 AM

## 2018-03-02 NOTE — Assessment & Plan Note (Addendum)
Patient presented with abnormal uterine bleeding.  Possible anovulatory bleeding given presumed diagnosis of PCOS.  Will stop daily progestin pills, will restart Depo-Provera as patient had control of menses with this.  Given patient's age we will get ultrasound to rule out endometrial abnormalities.  Referral to OB/GYN for further management.

## 2018-03-02 NOTE — Progress Notes (Signed)
Subjective:  Rita Lamb is a 42 y.o. female who presents to the [facility] today with a chief complaint of irregular periods.   HPI:  Irregular periods/PCOS/hirsutism Patient has history of regular periods.  She reports that she had 2 separate periods in September during the second and third week.  And 1 menstrual cycle during the second week of October. Pt bleeding every 2nd and 3rd weeks of September. 2nd week in October.  She reports that on average she has a [place] 5 days of bleeding.  In September she also was passing blood clots.  In the past patient was treated with Depo-Provera for menses control.  Patient was still having some breakthrough bleeding, so she was transitioned to the progesterone pill daily.  She reports that this is not working for her.  She would like to take Depo-Provera again.  Patient had thorough endocrine work-up in March 2019, which included normal prolactin, LH, FSH, testosterone.  Patient patient does have hirsutism, obesity, consistent with PCOS.  Patient reports that facial hair has been difficult to manage.  Very upset by this.  HTN Elevated today. Pt reports not take meds today.  Denies any headache, chest pain, shortness of breath, lower extremely swelling.  Patient takes HCTZ 25 mg at home.  ROS: Per HPI  PMH: Smoking history reviewed.    Objective:  Physical Exam: BP 140/90   Pulse 72   Temp 98.3 F (36.8 C) (Oral)   Ht 5\' 3"  (1.6 m)   Wt 258 lb (117 kg)   LMP 02/18/2018 (Exact Date)   SpO2 95%   BMI 45.70 kg/m   Gen: NAD, resting comfortably HEENT: Facial hair along upper and lower lips CV: RRR with no murmurs appreciated Pulm: NWOB, CTAB with no crackles, wheezes, or rhonchi GI: Normal bowel sounds present. Soft, Nontender, Nondistended. MSK: Extensive hair along arms and legs  skin: warm, dry Neuro: grossly normal, moves all extremities Psych: Normal affect and thought content  Results for orders placed or performed in visit on  03/02/18 (from the past 72 hour(s))  POCT urine pregnancy     Status: None   Collection Time: 03/02/18 10:20 AM  Result Value Ref Range   Preg Test, Ur Negative Negative     Assessment/Plan:  Abnormal uterine bleeding (AUB) Patient presented with abnormal uterine bleeding.  Possible anovulatory bleeding given presumed diagnosis of PCOS.  Will stop daily progestin pills, will restart Depo-Provera as patient had control of menses with this.  Given patient's age we will get ultrasound to rule out endometrial abnormalities.  Referral to OB/GYN for further management.  Essential hypertension Elevated BP.  Patient does not take blood pressure medication, however it is been above goal in the past.  Will continue HCTZ, starting Spironolactone for BP and for hirsutism.  Will check routine labs to assess renal function with metabolic panel panel.   Hirsutism Presumed PCOS, although testosterone levels were within normal limits.  Will trial spironolactone 25 mg daily.  Patient follow-up in 1 month.    Lab Orders     Comprehensive metabolic panel     CBC with Differential     POCT urine pregnancy  Meds ordered this encounter  Medications  . DISCONTD: medroxyPROGESTERone (DEPO-PROVERA) injection 150 mg  . spironolactone (ALDACTONE) 25 MG tablet    Sig: Take 1 tablet (25 mg total) by mouth daily.    Dispense:  30 tablet    Refill:  1  . medroxyPROGESTERone Acetate SUSY 150 mg  Marny Lowenstein, MD, [place] - PGY2 03/02/2018 5:37 PM

## 2018-03-02 NOTE — Assessment & Plan Note (Signed)
Elevated BP.  Patient does not take blood pressure medication, however it is been above goal in the past.  Will continue HCTZ, starting Spironolactone for BP and for hirsutism.  Will check routine labs to assess renal function with metabolic panel panel.

## 2018-03-03 LAB — COMPREHENSIVE METABOLIC PANEL
ALT: 13 IU/L (ref 0–32)
AST: 13 IU/L (ref 0–40)
Albumin/Globulin Ratio: 1.3 (ref 1.2–2.2)
Albumin: 3.9 g/dL (ref 3.5–5.5)
Alkaline Phosphatase: 89 IU/L (ref 39–117)
BUN/Creatinine Ratio: 13 (ref 9–23)
BUN: 10 mg/dL (ref 6–24)
Bilirubin Total: 0.4 mg/dL (ref 0.0–1.2)
CALCIUM: 8.7 mg/dL (ref 8.7–10.2)
CO2: 23 mmol/L (ref 20–29)
Chloride: 101 mmol/L (ref 96–106)
Creatinine, Ser: 0.77 mg/dL (ref 0.57–1.00)
GFR calc Af Amer: 110 mL/min/{1.73_m2} (ref >59)
GFR, EST NON AFRICAN AMERICAN: 96 mL/min/{1.73_m2} (ref >59)
Globulin, Total: 2.9 g/dL (ref 1.5–4.5)
Glucose: 94 mg/dL (ref 65–99)
Potassium: 4 mmol/L (ref 3.5–5.2)
Sodium: 140 mmol/L (ref 134–144)
Total Protein: 6.8 g/dL (ref 6.0–8.5)

## 2018-03-03 LAB — CBC WITH DIFFERENTIAL/PLATELET
BASOS ABS: 0.1 10*3/uL (ref 0.0–0.2)
BASOS: 1 %
EOS (ABSOLUTE): 0.2 10*3/uL (ref 0.0–0.4)
Eos: 2 %
HEMOGLOBIN: 11.2 g/dL (ref 11.1–15.9)
Hematocrit: 35.1 % (ref 34.0–46.6)
IMMATURE GRANS (ABS): 0 10*3/uL (ref 0.0–0.1)
Immature Granulocytes: 0 %
LYMPHS ABS: 2.7 10*3/uL (ref 0.7–3.1)
LYMPHS: 25 %
MCH: 25.7 pg — AB (ref 26.6–33.0)
MCHC: 31.9 g/dL (ref 31.5–35.7)
MCV: 81 fL (ref 79–97)
Monocytes Absolute: 0.6 10*3/uL (ref 0.1–0.9)
Monocytes: 5 %
NEUTROS ABS: 7.1 10*3/uL — AB (ref 1.4–7.0)
Neutrophils: 67 %
PLATELETS: 371 10*3/uL (ref 150–450)
RBC: 4.35 x10E6/uL (ref 3.77–5.28)
RDW: 13.8 % (ref 12.3–15.4)
WBC: 10.6 10*3/uL (ref 3.4–10.8)

## 2018-03-05 ENCOUNTER — Telehealth: Payer: Self-pay | Admitting: Family Medicine

## 2018-03-05 ENCOUNTER — Ambulatory Visit
Admission: RE | Admit: 2018-03-05 | Discharge: 2018-03-05 | Disposition: A | Discharge status: Home / Self Care | Payer: BLUE CROSS/BLUE SHIELD | Source: Ambulatory Visit | Attending: Family Medicine | Admitting: Family Medicine

## 2018-03-05 ENCOUNTER — Encounter: Payer: Self-pay | Admitting: Family Medicine

## 2018-03-05 DIAGNOSIS — N939 Abnormal uterine and vaginal bleeding, unspecified: Secondary | ICD-10-CM

## 2018-03-05 NOTE — Telephone Encounter (Signed)
Called pt. Informed that [place] results were normal and the next step would be to get an endometrial biopsy to r/o endometrial neoplasm. Requested pt to schedule an appointment to do this. Pt acknowledged this and appreciated the call.

## 2018-04-11 ENCOUNTER — Encounter: Payer: Self-pay | Admitting: Gynecology

## 2018-04-11 ENCOUNTER — Ambulatory Visit (INDEPENDENT_AMBULATORY_CARE_PROVIDER_SITE_OTHER): Payer: BLUE CROSS/BLUE SHIELD | Admitting: Gynecology

## 2018-04-11 VITALS — BP 124/84 | Ht 63.0 in | Wt 253.0 lb

## 2018-04-11 DIAGNOSIS — Z124 Encounter for screening for malignant neoplasm of cervix: Secondary | ICD-10-CM

## 2018-04-11 DIAGNOSIS — L680 Hirsutism: Secondary | ICD-10-CM

## 2018-04-11 DIAGNOSIS — N926 Irregular menstruation, unspecified: Secondary | ICD-10-CM | POA: Diagnosis not present

## 2018-04-11 NOTE — Patient Instructions (Addendum)
Follow up for the coloscopy appointment as scheduled

## 2018-04-11 NOTE — Progress Notes (Signed)
    Rita Lamb [birth year] [id number]        42 y.o.  [id number] new patient presents with a history of irregular bleeding.  Has been diagnosed with PCOS in the past.  Has always had irregular menses since menarche.  Has been treated a variety of way to include Depo-Provera and oral contraceptives.  Has had 3 pregnancies without difficulty.  Had been on birth control pills this past year for menstrual regulation noting that she does have a tubal sterilization.  Began bleeding every other week in September through October.  Received Depo-Provera then and stopped bleeding.  Hormone studies at that time showed normal LH FSH prolactin TSH and testosterone levels.  She does have a history of hirsutism with facial hair but no abnormal hair growth otherwise as far as chest or anterior lower abdomen.  No balding voice changes or weight changes.  Was recently started on spironolactone by her primary provider.  Recent ultrasound was normal noting endometrial echo 9 mm without focal abnormalities.  Right and left ovaries grossly normal.  Uterus without evidence of myomas or other pathology.  Past medical history,surgical history, problem list, medications, allergies, family history and social history were all reviewed and documented in the EPIC chart.  Directed ROS with pertinent positives and negatives documented in the history of present illness/assessment and plan.  Exam: Caryn Bee assistant Vitals:   04/11/18 1222  BP: 124/84  Weight: 253 lb (114.8 kg)  Height: 5\' 3"  (1.6 m)   General appearance:  Normal HEENT with fairly significant chin hirsutism.  No chest hair growth.  No lower abdomen abnormal hair growth. Abdomen soft nontender without masses guarding rebound Pelvic external BUS vagina with light menstrual flow.  Cervix with ectropion and some polypoid changes anterior lip.  Pap smear to include sampling of this area performed.  Uterus grossly normal size midline mobile nontender.  Adnexa  without masses or tenderness.  Assessment/Plan:  42 y.o. [id number] with history of PCOS treated with progesterone in the past.  Had been on oral contraceptives and began bleeding irregularly in September and October.  Was treated with Depo-Provera and has remained without menses until today where she noticed some bleeding.  Ultrasound was negative for pathology.  Recent hormone studies showed normal testosterone LH FSH prolactin and TSH.  Given the prominence of her chin hirsutism I have recommended to check also a 17 hydroxyprogesterone and DHEAS.  Cervix has polypoid changes anteriorly which may be a reflection of an ectropion/multiparous changes as well as a history of cryosurgery in the past.  Recommend colposcopy for a closer look and biopsy if indicated.  I did a Pap smear over this area today.  I also discussed probable endometrial biopsy at the same time for completeness.  Patient is going to schedule this appointment in follow-up for this.  We reviewed various treatment options for irregular bleeding and PCOS.  Not sure Depo-Provera would be a good long-term solution.  Intermittent progesterone challenges, low-dose oral contraceptives which would also help with ovarian androgen suppression and her hirsutism, Mirena IUD for menstrual suppression all reviewed.  Patient will follow-up for the colposcopy appointment and will further discuss at that time.  I spent a total of 30 face-to-face minutes with the patient, over 50% was spent counseling and coordination of care. Anastasio Auerbach MD, 1:07 PM 04/11/2018

## 2018-04-11 NOTE — Addendum Note (Signed)
Addended by: Nelva Nay on: 04/11/2018 01:54 PM   Modules accepted: Orders

## 2018-04-12 LAB — DHEA-SULFATE: DHEA SO4: 103 ug/dL (ref 19–231)

## 2018-04-13 LAB — PAP IG W/ RFLX HPV ASCU

## 2018-04-14 LAB — 17-HYDROXYPROGESTERONE: 17-OH-PROGESTERONE, LC/MS/MS: 35 ng/dL

## 2018-04-27 ENCOUNTER — Ambulatory Visit: Payer: BLUE CROSS/BLUE SHIELD | Admitting: Gynecology

## 2018-04-27 ENCOUNTER — Encounter: Payer: Self-pay | Admitting: Gynecology

## 2018-04-27 VITALS — BP 124/82

## 2018-04-27 DIAGNOSIS — N926 Irregular menstruation, unspecified: Secondary | ICD-10-CM

## 2018-04-27 DIAGNOSIS — Z124 Encounter for screening for malignant neoplasm of cervix: Secondary | ICD-10-CM

## 2018-04-27 DIAGNOSIS — N86 Erosion and ectropion of cervix uteri: Secondary | ICD-10-CM

## 2018-04-27 NOTE — Progress Notes (Signed)
    Rita Lamb March 15, 1976 [id number]        42 y.o.  [id number] presents for colposcopy.  History of irregular bleeding with PCOS.  Most recently received Depo-Provera which lessens her bleeding.  Recent ultrasound was normal noting endometrial echo 9 mm without focal abnormalities.  Right and left ovaries normal.  Exam at that point showed cervical ectropion with polypoid changes questionable polyps.  She returns now for colposcopy for better visualization.  Pap smear done performed was inadequate.  Past medical history,surgical history, problem list, medications, allergies, family history and social history were all reviewed and documented in the EPIC chart.  Directed ROS with pertinent positives and negatives documented in the history of present illness/assessment and plan.  Exam: Caryn Bee assistant Vitals:   04/27/18 1146  BP: 124/82   General appearance:  Normal Abdomen soft nontender without masses guarding rebound Pelvic external BUS vagina normal.  Cervix with ectropion.  Uterus grossly normal midline mobile nontender.  Adnexa without masses or tenderness.  Colposcopy performed after acetic acid cleanse showed extensive ectropion 360 degrees but no focal abnormalities.  Due to her history of irregular bleeding and endometrial biopsy was performed.  The patient tolerated well.  Good sample obtained.  Her Pap smear was also repeated prior to the colposcopy.  Physical Exam  Genitourinary:        Assessment/Plan:  42 y.o. [id number] with cervical ectropion which I think is secondary to her history of cryosurgery.  Pap smear was redone today.  Endometrial biopsy also performed due to history of irregular bleeding.  We discussed again options for management of her irregular bleeding.  I suggested trial of Mirena IUD assuming biopsy okay.  Benefits of IUD were discussed.  The patient was also questioning the need for hysterectomy.  I discussed that this may be a possibility in the  future but I think conservative trial before hand with IUD would be worthwhile.  She has used birth control pills in the past but had irregular bleeding with these.  Had used Depo-Provera but I am not sure this is a good long-term solution noting she also is having some irregular bleeding with the recent Depo-Provera.  Will further discuss after biopsy results.    Anastasio Auerbach MD, 12:07 PM 04/27/2018

## 2018-04-27 NOTE — Patient Instructions (Signed)
Office will call you with biopsy results 

## 2018-04-27 NOTE — Addendum Note (Signed)
Addended by: Nelva Nay on: 04/27/2018 12:22 PM   Modules accepted: Orders

## 2018-04-27 NOTE — Addendum Note (Signed)
Addended by: Anastasio Auerbach on: 04/27/2018 12:44 PM   Modules accepted: Orders

## 2018-05-01 LAB — PAP IG W/ RFLX HPV ASCU

## 2018-05-01 LAB — PATHOLOGY

## 2018-05-01 LAB — TISSUE SPECIMEN

## 2018-09-05 ENCOUNTER — Other Ambulatory Visit: Payer: Self-pay

## 2018-09-05 ENCOUNTER — Telehealth (INDEPENDENT_AMBULATORY_CARE_PROVIDER_SITE_OTHER): Payer: BLUE CROSS/BLUE SHIELD | Admitting: Family Medicine

## 2018-09-05 DIAGNOSIS — M79641 Pain in right hand: Secondary | ICD-10-CM

## 2018-09-05 MED ORDER — IBUPROFEN 600 MG PO TABS: 600.0000 mg | ORAL_TABLET | Freq: Four times a day (QID) | ORAL | 0 refills | Status: DC

## 2018-09-05 NOTE — Assessment & Plan Note (Signed)
Atypical presentation for carpal tunnel.  No evidence of acute infection.  Could also be an inflammatory arthropathy. Will rx with general measures of splinting (she has old carpal tunnel splint) and regular dosing of NSAID. If no improvement, face-to-face visit next week.

## 2018-09-05 NOTE — Progress Notes (Signed)
Agrees to video visit. Right carpal tunnel syndrome.  Has long hx of carpal tunnel.  Sudden pain in hand  Began 3 days ago.  Now lots of pain and stiffness.  Right handed.  No trauma.  Maybe a little swelling.  Has tried roll on [employer].  Popping tylenol.  Nothing has helped.  Mostly pain, small amount of numbness on the dorsum of the hand.  Stiffness, especially of ring finger.  No redness, fever, cuts or signs of infection. Does use hands a lot.  Typing and other repetitive motion injuries. This pain is much than previous carpal tunnel flairs.  Brother has gout.  She has not been diagnosed.  Duration of call 18 minutes.

## 2019-01-29 ENCOUNTER — Encounter: Payer: Self-pay | Admitting: Gynecology

## 2019-03-14 ENCOUNTER — Encounter (HOSPITAL_COMMUNITY): Payer: Self-pay

## 2019-03-14 ENCOUNTER — Other Ambulatory Visit: Payer: Self-pay

## 2019-03-14 ENCOUNTER — Ambulatory Visit: Payer: BLUE CROSS/BLUE SHIELD

## 2019-03-14 ENCOUNTER — Ambulatory Visit (HOSPITAL_COMMUNITY)
Admission: EM | Admit: 2019-03-14 | Discharge: 2019-03-14 | Disposition: A | Discharge status: Home / Self Care | Payer: BLUE CROSS/BLUE SHIELD | Attending: Family Medicine | Admitting: Family Medicine

## 2019-03-14 DIAGNOSIS — H6123 Impacted cerumen, bilateral: Secondary | ICD-10-CM | POA: Diagnosis not present

## 2019-03-14 NOTE — ED Provider Notes (Signed)
[place]    CSN: [id number] Arrival date & time: 03/14/19  0905      History   Chief Complaint Chief Complaint  Patient presents with  . cerumen impaction    HPI Rita Lamb is a 43 y.o. female.   HPI  Patient has recurrent cerumen impactions Went to family medicine today and was sent here for irrigation Has hearing loss No pain or drainage from ears Is otherwise well I inquired about her BP and she tells me she has stopped taking her medicine.  I encouraged her to take it every day even if she cannot tell it is helping.  She assures me she has medicine at home.  Past Medical History:  Diagnosis Date  . Arthritis    bilateral knee  . Asthma   . Bell's palsy   . Chronic hypertension complicating or reason for care during pregnancy 07/23/2015   Guidelines for Antenatal Testing and Sonography  (Revised 07/2012)  INDICATION U/S NST/BPP DELIVERY  CHTN - 642.03   Group I   BP < 140/90, no PIH, AGA,  nml AFV, +/- meds     Group II   BP > 140/90, on meds, no PIH, AGA, nml AFV  [ ]  20-[ ]  28- [ ] 34- [ ] 38  20-24-28-31-34-37  32//2 x wk  28//BPP wkly then 32//2 x wk  40 no meds; 39 meds  FLM or 39  Previous Stillbirth (> 28 wks) - V23.5 20-24-28-32-36 28//BPP wkly then 32//2 x wk 39   Baseline Labs:   AST/ALT 8/8 24 protein:  191    . Helicobacter pylori (H. pylori) infection   . Hernia, umbilical   . Hypertension   . PCOS (polycystic ovarian syndrome)     Patient Active Problem List   Diagnosis Date Noted  . Right hand pain 09/05/2018  . Abnormal uterine bleeding (AUB) 03/02/2018  . Umbilical hernia without obstruction and without gangrene 01/31/2018  . Essential hypertension 01/31/2018  . Bilateral impacted cerumen 01/31/2018  . Bell's palsy 03/03/2017  . Biological false positive RPR test 07/23/2015  . History of abnormal cervical Pap smear 07/23/2015  . Irregular periods 02/28/2014  . Hirsutism 02/15/2012  . Astigmatism of left eye 12/05/2011  .  Asthma, intermittent 10/07/2011  . Morbid obesity with BMI of 45.0-49.9, adult ([place]) 10/07/2011    Past Surgical History:  Procedure Laterality Date  . CRYOTHERAPY  1996   cervix  . LAPAROSCOPIC TUBAL LIGATION Bilateral 02/01/2016   Procedure: LAPAROSCOPIC Bilateral TUBAL LIGATION by Fulgation;  Surgeon: Chancy Milroy, MD;  Location: [facility];  Service: Gynecology;  Laterality: Bilateral;  . TONSILLECTOMY AND ADENOIDECTOMY      OB History    Gravida  3   Para  3   Term  3   Preterm      AB      Living  3     SAB      TAB      Ectopic      Multiple  0   Live Births  1            Home Medications    Prior to Admission medications   Medication Sig Start Date End Date Taking? Authorizing Provider  ibuprofen (ADVIL) 600 MG tablet Take 1 tablet (600 mg total) by mouth 4 (four) times daily. 09/05/18   Zenia Resides, MD  hydrochlorothiazide (HYDRODIURIL) 25 MG tablet Take 1 tablet (25 mg total) by mouth daily. 01/31/18 03/14/19  Grandville Silos,  Corena Pilgrim, MD  spironolactone (ALDACTONE) 25 MG tablet Take 1 tablet (25 mg total) by mouth daily. 03/02/18 03/14/19  Bonnita Hollow, MD    Family History Family History  Problem Relation Age of Onset  . Diabetes Father   . Diabetes Sister   . Alcohol abuse Sister   . Colon cancer Paternal Grandfather   . Cancer Paternal Grandfather   . Stroke Paternal Grandfather   . Alcohol abuse Mother   . HIV Mother   . Diabetes Mother   . Dementia Maternal Grandmother   . Cancer Maternal Grandmother 47       colon,   . Cancer Maternal Grandfather   . Diabetes Maternal Grandfather   . Cancer Paternal Grandmother   . Heart disease Paternal Grandmother   . Diabetes Paternal Grandmother     Social History Social History   Tobacco Use  . Smoking status: Passive Smoke Exposure - Never Smoker  . Smokeless tobacco: Never Used  Substance Use Topics  . Alcohol use: No  . Drug use: No     Allergies   Sulfa antibiotics    Review of Systems Review of Systems  Constitutional: Negative for chills and fever.  HENT: Positive for hearing loss. Negative for ear pain and sore throat.   Eyes: Negative for pain and visual disturbance.  Respiratory: Negative for cough and shortness of breath.   Cardiovascular: Negative for chest pain and palpitations.  Gastrointestinal: Negative for abdominal pain and vomiting.  Genitourinary: Negative for dysuria and hematuria.  Musculoskeletal: Negative for arthralgias and back pain.  Skin: Negative for color change and rash.  Neurological: Negative for seizures and syncope.  All other systems reviewed and are negative.    Physical Exam Triage Vital Signs ED Triage Vitals  Enc Vitals Group     BP 03/14/19 0926 (!) 163/102     Pulse Rate 03/14/19 0920 77     Resp 03/14/19 0920 18     Temp 03/14/19 0920 (!) 97.5 F (36.4 C)     Temp Source 03/14/19 0920 Oral     SpO2 03/14/19 0920 97 %     Weight --      Height --      Head Circumference --      Peak Flow --      Pain Score 03/14/19 0923 0     Pain Loc --      Pain Edu? --      Excl. in [place]? --    No data found.  Updated Vital Signs BP (!) 163/102 (BP Location: Right Arm)   Pulse 77   Temp (!) 97.5 F (36.4 C) (Oral)   Resp 18   SpO2 97%       Physical Exam Constitutional:      General: She is not in acute distress.    Appearance: She is well-developed. She is obese.  HENT:     Head: Normocephalic and atraumatic.     Right Ear: There is impacted cerumen.     Left Ear: There is impacted cerumen.     Nose: No congestion.     Mouth/Throat:     Comments: Dentition poor, many absent teeth Eyes:     Conjunctiva/sclera: Conjunctivae normal.     Pupils: Pupils are equal, round, and reactive to light.  Neck:     Musculoskeletal: Normal range of motion.  Cardiovascular:     Rate and Rhythm: Normal rate.  Pulmonary:     Effort: Pulmonary effort is  normal. No respiratory distress.  Abdominal:     General:  There is no distension.     Palpations: Abdomen is soft.  Musculoskeletal: Normal range of motion.  Skin:    General: Skin is warm and dry.  Neurological:     Mental Status: She is alert.  Psychiatric:        Mood and Affect: Mood normal.        Behavior: Behavior normal.     Comments: Pleasant, happy   After the irrigation both ears are clear.  Unseld regarding home care   UC Treatments / Results  Labs (all labs ordered are listed, but only abnormal results are displayed) Labs Reviewed - No data to display  EKG   Radiology No results found.  Procedures Procedures (including critical care time)  Medications Ordered in UC Medications - No data to display  Initial Impression / Assessment and Plan / UC Course  I have reviewed the triage vital signs and the nursing notes.  Pertinent labs & imaging results that were available during my care of the patient were reviewed by me and considered in my medical decision making (see chart for details).     Wanted to take blood pressure medicine daily Final Clinical Impressions(s) / UC Diagnoses   Final diagnoses:  Bilateral hearing loss due to cerumen impaction     Discharge Instructions     Take the BP medicine every day Follow up with family practice   ED Prescriptions    None     PDMP not reviewed this encounter.   Raylene Everts, MD 03/14/19 1021

## 2019-03-14 NOTE — Discharge Instructions (Signed)
Take the BP medicine every day Follow up with family practice

## 2019-03-14 NOTE — ED Triage Notes (Signed)
Pt presents to UC w/ c/o bilateral hear loss. Pt states this happens once a year where she has to get her ears irrigated due to cerumen impaction.

## 2019-03-14 NOTE — ED Notes (Signed)
Pt's bilateral ears irrigated and flushed appropriately. Pt states hearing is "much better" now

## 2019-06-24 ENCOUNTER — Emergency Department (HOSPITAL_COMMUNITY): Payer: BLUE CROSS/BLUE SHIELD

## 2019-06-24 ENCOUNTER — Encounter (HOSPITAL_COMMUNITY): Payer: Self-pay | Admitting: Emergency Medicine

## 2019-06-24 ENCOUNTER — Other Ambulatory Visit: Payer: Self-pay

## 2019-06-24 ENCOUNTER — Emergency Department (HOSPITAL_COMMUNITY)
Admission: EM | Admit: 2019-06-24 | Discharge: 2019-06-24 | Disposition: A | Discharge status: Home / Self Care | Payer: BLUE CROSS/BLUE SHIELD | Attending: Emergency Medicine | Admitting: Emergency Medicine

## 2019-06-24 DIAGNOSIS — J45909 Unspecified asthma, uncomplicated: Secondary | ICD-10-CM | POA: Diagnosis not present

## 2019-06-24 DIAGNOSIS — R1013 Epigastric pain: Secondary | ICD-10-CM | POA: Diagnosis not present

## 2019-06-24 DIAGNOSIS — Z791 Long term (current) use of non-steroidal anti-inflammatories (NSAID): Secondary | ICD-10-CM | POA: Insufficient documentation

## 2019-06-24 DIAGNOSIS — I10 Essential (primary) hypertension: Secondary | ICD-10-CM | POA: Diagnosis not present

## 2019-06-24 DIAGNOSIS — Z7722 Contact with and (suspected) exposure to environmental tobacco smoke (acute) (chronic): Secondary | ICD-10-CM | POA: Diagnosis not present

## 2019-06-24 DIAGNOSIS — R0789 Other chest pain: Secondary | ICD-10-CM | POA: Diagnosis present

## 2019-06-24 DIAGNOSIS — K8020 Calculus of gallbladder without cholecystitis without obstruction: Secondary | ICD-10-CM | POA: Insufficient documentation

## 2019-06-24 LAB — CBC WITH DIFFERENTIAL/PLATELET
Abs Immature Granulocytes: 0.06 10*3/uL (ref 0.00–0.07)
Basophils Absolute: 0.1 10*3/uL (ref 0.0–0.1)
Basophils Relative: 0 %
Eosinophils Absolute: 0.1 10*3/uL (ref 0.0–0.5)
Eosinophils Relative: 1 %
HCT: 43.5 % (ref 36.0–46.0)
Hemoglobin: 13.4 g/dL (ref 12.0–15.0)
Immature Granulocytes: 0 %
Lymphocytes Relative: 20 %
Lymphs Abs: 3 10*3/uL (ref 0.7–4.0)
MCH: 27 pg (ref 26.0–34.0)
MCHC: 30.8 g/dL (ref 30.0–36.0)
MCV: 87.5 fL (ref 80.0–100.0)
Monocytes Absolute: 0.8 10*3/uL (ref 0.1–1.0)
Monocytes Relative: 5 %
Neutro Abs: 10.7 10*3/uL — ABNORMAL HIGH (ref 1.7–7.7)
Neutrophils Relative %: 74 %
Platelets: 363 10*3/uL (ref 150–400)
RBC: 4.97 MIL/uL (ref 3.87–5.11)
RDW: 14.1 % (ref 11.5–15.5)
WBC: 14.6 10*3/uL — ABNORMAL HIGH (ref 4.0–10.5)
nRBC: 0 % (ref 0.0–0.2)

## 2019-06-24 LAB — COMPREHENSIVE METABOLIC PANEL
ALT: 15 U/L (ref 0–44)
AST: 14 U/L — ABNORMAL LOW (ref 15–41)
Albumin: 3.9 g/dL (ref 3.5–5.0)
Alkaline Phosphatase: 85 U/L (ref 38–126)
Anion gap: 12 (ref 5–15)
BUN: 8 mg/dL (ref 6–20)
CO2: 24 mmol/L (ref 22–32)
Calcium: 8.9 mg/dL (ref 8.9–10.3)
Chloride: 99 mmol/L (ref 98–111)
Creatinine, Ser: 0.78 mg/dL (ref 0.44–1.00)
GFR calc Af Amer: >60 mL/min (ref >60)
GFR calc non Af Amer: >60 mL/min (ref >60)
Glucose, Bld: 156 mg/dL — ABNORMAL HIGH (ref 70–99)
Potassium: 3.8 mmol/L (ref 3.5–5.1)
Sodium: 135 mmol/L (ref 135–145)
Total Bilirubin: 0.7 mg/dL (ref 0.3–1.2)
Total Protein: 8.1 g/dL (ref 6.5–8.1)

## 2019-06-24 LAB — LIPASE, BLOOD: Lipase: 25 U/L (ref 11–51)

## 2019-06-24 LAB — I-STAT BETA HCG BLOOD, ED (MC, WL, AP ONLY): I-stat hCG, quantitative: <5 m[IU]/mL (ref <5)

## 2019-06-24 LAB — TROPONIN I (HIGH SENSITIVITY)
Troponin I (High Sensitivity): 5 ng/L (ref <18)
Troponin I (High Sensitivity): 4 ng/L (ref <18)

## 2019-06-24 MED ORDER — HYDROCHLOROTHIAZIDE 25 MG PO TABS
25.0000 mg | ORAL_TABLET | Freq: Once | ORAL | Status: AC
  Administered 2019-06-24: 25 mg via ORAL
  Filled 2019-06-24: qty 1

## 2019-06-24 MED ORDER — ONDANSETRON 4 MG PO TBDP: 4.0000 mg | ORAL_TABLET | Freq: Three times a day (TID) | ORAL | 0 refills | Status: DC | PRN

## 2019-06-24 MED ORDER — HYDROCODONE-ACETAMINOPHEN 5-325 MG PO TABS: 1.0000 | ORAL_TABLET | Freq: Four times a day (QID) | ORAL | 0 refills | Status: DC | PRN

## 2019-06-24 MED ORDER — MORPHINE SULFATE (PF) 4 MG/ML IV SOLN
4.0000 mg | Freq: Once | INTRAVENOUS | Status: AC
  Administered 2019-06-24: 4 mg via INTRAVENOUS
  Filled 2019-06-24: qty 1

## 2019-06-24 MED ORDER — HYDROMORPHONE HCL 1 MG/ML IJ SOLN
1.0000 mg | Freq: Once | INTRAMUSCULAR | Status: AC
  Administered 2019-06-24: 1 mg via INTRAVENOUS
  Filled 2019-06-24: qty 1

## 2019-06-24 MED ORDER — IOHEXOL 350 MG/ML SOLN
100.0000 mL | Freq: Once | INTRAVENOUS | Status: AC | PRN
  Administered 2019-06-24: 100 mL via INTRAVENOUS

## 2019-06-24 MED ORDER — ONDANSETRON HCL 4 MG/2ML IJ SOLN
4.0000 mg | Freq: Once | INTRAMUSCULAR | Status: AC
  Administered 2019-06-24: 4 mg via INTRAVENOUS
  Filled 2019-06-24: qty 2

## 2019-06-24 MED ORDER — SPIRONOLACTONE-HCTZ 25-25 MG PO TABS: 1.0000 | ORAL_TABLET | Freq: Every day | ORAL | 0 refills | Status: DC

## 2019-06-24 MED ORDER — SPIRONOLACTONE 25 MG PO TABS
25.0000 mg | ORAL_TABLET | Freq: Once | ORAL | Status: AC
  Administered 2019-06-24: 25 mg via ORAL
  Filled 2019-06-24 (×2): qty 1

## 2019-06-24 MED ORDER — SPIRONOLACTONE-HCTZ 25-25 MG PO TABS: 1.0000 | ORAL_TABLET | Freq: Every day | ORAL | Status: DC

## 2019-06-24 NOTE — Discharge Instructions (Addendum)
Please take medications as directed.  You need to follow-up with your doctor.  Please discuss your CT scan results with your doctor.  I also recommend that you be seen by a GI specialist and a general surgeon.  You will likely need more tests on your gallbladder and may need to have it removed.  Return to the ER for worsening pain or fever.

## 2019-06-24 NOTE — ED Notes (Signed)
Pt taken to US

## 2019-06-24 NOTE — ED Provider Notes (Signed)
[facility] EMERGENCY DEPARTMENT Provider Note   CSN: [id number] Arrival date & time: 06/24/19  0305     History Chief Complaint  Patient presents with  . Chest Pain  . Shortness of Breath    Rita Lamb is a 44 y.o. female.  Patient with hx of HTN, non-complaint with meds, asthma, and PCOS presents to the ED with a chief complaint of epigastric abdominal pain.  She states that the pain radiates into her chest and back.  She states that the pain takes her breath away causing her to feel short of breath.  She denies fever or cough.  She states that the pain started around 11pm and felt like indigestion.  She went on to have some vomiting around midnight, but states that the pain has worsened.  She denies and ETOH use. The pain is worsened with palpation of her upper abdomen.  The history is provided by the patient. No language interpreter was used.       Past Medical History:  Diagnosis Date  . Arthritis    bilateral knee  . Asthma   . Bell's palsy   . Chronic hypertension complicating or reason for care during pregnancy 07/23/2015   Guidelines for Antenatal Testing and Sonography  (Revised 07/2012)  INDICATION U/S NST/BPP DELIVERY  CHTN - 642.03   Group I   BP < 140/90, no PIH, AGA,  nml AFV, +/- meds     Group II   BP > 140/90, on meds, no PIH, AGA, nml AFV  [ ]  20-[ ]  28- [ ] 34- [ ] 38  20-24-28-31-34-37  32//2 x wk  28//BPP wkly then 32//2 x wk  40 no meds; 39 meds  FLM or 39  Previous Stillbirth (> 28 wks) - V23.5 20-24-28-32-36 28//BPP wkly then 32//2 x wk 39   Baseline Labs:   AST/ALT 8/8 24 protein:  191    . Helicobacter pylori (H. pylori) infection   . Hernia, umbilical   . Hypertension   . PCOS (polycystic ovarian syndrome)     Patient Active Problem List   Diagnosis Date Noted  . Right hand pain 09/05/2018  . Abnormal uterine bleeding (AUB) 03/02/2018  . Umbilical hernia without obstruction and without gangrene 01/31/2018  . Essential  hypertension 01/31/2018  . Bilateral impacted cerumen 01/31/2018  . Bell's palsy 03/03/2017  . Biological false positive RPR test 07/23/2015  . History of abnormal cervical Pap smear 07/23/2015  . Irregular periods 02/28/2014  . Hirsutism 02/15/2012  . Astigmatism of left eye 12/05/2011  . Asthma, intermittent 10/07/2011  . Morbid obesity with BMI of 45.0-49.9, adult ([place]) 10/07/2011    Past Surgical History:  Procedure Laterality Date  . CRYOTHERAPY  1996   cervix  . LAPAROSCOPIC TUBAL LIGATION Bilateral 02/01/2016   Procedure: LAPAROSCOPIC Bilateral TUBAL LIGATION by Fulgation;  Surgeon: Chancy Milroy, MD;  Location: [facility];  Service: Gynecology;  Laterality: Bilateral;  . TONSILLECTOMY AND ADENOIDECTOMY       OB History    Gravida  3   Para  3   Term  3   Preterm      AB      Living  3     SAB      TAB      Ectopic      Multiple  0   Live Births  1           Family History  Problem Relation Age of Onset  . Diabetes  Father   . Diabetes Sister   . Alcohol abuse Sister   . Colon cancer Paternal Grandfather   . Cancer Paternal Grandfather   . Stroke Paternal Grandfather   . Alcohol abuse Mother   . HIV Mother   . Diabetes Mother   . Dementia Maternal Grandmother   . Cancer Maternal Grandmother 61       colon,   . Cancer Maternal Grandfather   . Diabetes Maternal Grandfather   . Cancer Paternal Grandmother   . Heart disease Paternal Grandmother   . Diabetes Paternal Grandmother     Social History   Tobacco Use  . Smoking status: Passive Smoke Exposure - Never Smoker  . Smokeless tobacco: Never Used  Substance Use Topics  . Alcohol use: No  . Drug use: No    Home Medications Prior to Admission medications   Medication Sig Start Date End Date Taking? Authorizing Provider  ibuprofen (ADVIL) 600 MG tablet Take 1 tablet (600 mg total) by mouth 4 (four) times daily. 09/05/18   Zenia Resides, MD  hydrochlorothiazide (HYDRODIURIL) 25 MG  tablet Take 1 tablet (25 mg total) by mouth daily. 01/31/18 03/14/19  Bonnita Hollow, MD  spironolactone (ALDACTONE) 25 MG tablet Take 1 tablet (25 mg total) by mouth daily. 03/02/18 03/14/19  Bonnita Hollow, MD    Allergies    Sulfa antibiotics  Review of Systems   Review of Systems  All other systems reviewed and are negative.   Physical Exam Updated Vital Signs Temp 98.2 F (36.8 C) (Oral)   Wt 117.9 kg   LMP 05/24/2019   BMI 46.06 kg/m   Physical Exam Vitals and nursing note reviewed.  Constitutional:      General: She is not in acute distress.    Appearance: She is well-developed.  HENT:     Head: Normocephalic and atraumatic.  Eyes:     Conjunctiva/sclera: Conjunctivae normal.  Cardiovascular:     Rate and Rhythm: Normal rate and regular rhythm.     Heart sounds: No murmur.  Pulmonary:     Effort: Pulmonary effort is normal. No respiratory distress.     Breath sounds: Normal breath sounds.     Comments: CTAB Abdominal:     Palpations: Abdomen is soft.     Tenderness: There is abdominal tenderness.     Comments: RUQ and epigastric TTP  Musculoskeletal:        General: Normal range of motion.     Cervical back: Neck supple.  Skin:    General: Skin is warm and dry.  Neurological:     Mental Status: She is alert and oriented to person, place, and time.  Psychiatric:        Mood and Affect: Mood normal.        Behavior: Behavior normal.     ED Results / Procedures / Treatments   Labs (all labs ordered are listed, but only abnormal results are displayed) Labs Reviewed  CBC WITH DIFFERENTIAL/PLATELET - Abnormal; Notable for the following components:      Result Value   WBC 14.6 (*)    Neutro Abs 10.7 (*)    All other components within normal limits  COMPREHENSIVE METABOLIC PANEL - Abnormal; Notable for the following components:   Glucose, Bld 156 (*)    AST 14 (*)    All other components within normal limits  LIPASE, BLOOD  I-STAT BETA HCG BLOOD,  ED ([facility], [facility], [facility] ONLY)  TROPONIN I (HIGH SENSITIVITY)  TROPONIN I (HIGH SENSITIVITY)    EKG EKG Interpretation  Date/Time:  Monday June 24 2019 03:14:17 EST Ventricular Rate:  72 PR Interval:    QRS Duration: 110 QT Interval:  378 QTC Calculation: 414 R Axis:   36 Text Interpretation: Sinus rhythm RSR' in V1 or V2, probably normal variant No significant change since last tracing Confirmed by Orpah Greek [phone]) on 06/24/2019 3:38:37 AM   Radiology US Abdomen Limited  Result Date: 06/24/2019 CLINICAL DATA:  Right upper quadrant pain. Epigastric burning and shortness of breath. EXAM: ULTRASOUND ABDOMEN LIMITED RIGHT UPPER QUADRANT COMPARISON:  None. FINDINGS: Gallbladder: Physiologically distended with multiple shadowing gallstones. No gallbladder wall thickening or pericholecystic fluid. No sonographic Murphy sign noted by sonographer. Common bile duct: Diameter: 4 mm, normal. Liver: No focal lesion identified. Diffusely increased in parenchymal echogenicity. Portal vein is patent on color Doppler imaging with normal direction of blood flow towards the liver. Other: None. IMPRESSION: 1. Gallstones without sonographic findings of acute cholecystitis. No biliary dilatation. 2. Hepatic steatosis. Electronically Signed   By: Keith Rake M.D.   On: 06/24/2019 04:09   DG Chest Port 1 View  Result Date: 06/24/2019 CLINICAL DATA:  Right upper quadrant pain. Shortness of breath. EXAM: PORTABLE CHEST 1 VIEW COMPARISON:  06/23/2017 FINDINGS: Lung volumes are low.The cardiomediastinal contours are normal. Minor atelectasis at the right lung base. Pulmonary vasculature is normal. No confluent consolidation, pleural effusion, or pneumothorax. No acute osseous abnormalities are seen. IMPRESSION: Low lung volumes with right basilar atelectasis. Electronically Signed   By: Keith Rake M.D.   On: 06/24/2019 03:51   CT Angio Chest/Abd/Pel for Dissection W and/or Wo Contrast  Result  Date: 06/24/2019 CLINICAL DATA:  Chest pain or back pain. Aortic dissection suspected. EXAM: CT ANGIOGRAPHY CHEST, ABDOMEN AND PELVIS TECHNIQUE: Multidetector CT imaging through the chest, abdomen and pelvis was performed using the standard protocol during bolus administration of intravenous contrast. Multiplanar reconstructed images and MIPs were obtained and reviewed to evaluate the vascular anatomy. CONTRAST:  187mL OMNIPAQUE IOHEXOL 350 MG/ML SOLN COMPARISON:  None. FINDINGS: CTA CHEST FINDINGS Cardiovascular: Noncontrast phase shows no acute intramural hematoma. No aortic dissection or aneurysm. No cardiomegaly or pericardial effusion. The opacified pulmonary arteries are negative for filling defect Mediastinum/Nodes: Negative for adenopathy, mass, or inflammatory change. Minimal residual thymus. Lungs/Pleura: The central airways are clear. Mild dependent atelectasis. There are a few subpleural nodules below size threshold for follow-up in a never smoker. Musculoskeletal: No acute or aggressive finding Review of the MIP images confirms the above findings. CTA ABDOMEN AND PELVIS FINDINGS VASCULAR Aorta: Vessels are smooth and widely patent. Negative for aneurysm. No atheromatous changes Celiac: Normal SMA: Normal Renals: Normal when allowing for minimal motion artifact and blurring. IMA: Patent Inflow: Vessels are smooth and widely patent with no atheromatous changes Veins: Negative in the arterial phase. Review of the MIP images confirms the above findings. NON-VASCULAR Hepatobiliary: Negative liver. There are gas containing gallstones within the distended gallbladder which may show mild wall thickening but no pericholecystic edema. No bile duct dilatation. Pancreas: Unremarkable Spleen: Negative Adrenals/Urinary Tract: Negative adrenals and kidneys. No hydronephrosis. Negative urinary bladder. Stomach/Bowel: No obstruction or inflammatory changes. Lymphatic: Negative for adenopathy Reproductive: Symmetric  prominence of the ovarian size with follicular changes on the left. Other: No ascites or pneumoperitoneum.  Fatty umbilical hernia Musculoskeletal: No acute finding.  Osteitis pubis. Review of the MIP images confirms the above findings. IMPRESSION: 1. Normal CTA of the aorta. 2. Cholelithiasis with mild  thickening of the gallbladder wall, please correlate for cholecystitis symptoms. 3. Fatty umbilical hernia. Electronically Signed   By: Monte Fantasia M.D.   On: 06/24/2019 05:28    Procedures Procedures (including critical care time)  Medications Ordered in ED Medications  morphine 4 MG/ML injection 4 mg (has no administration in time range)  ondansetron (ZOFRAN) injection 4 mg (has no administration in time range)    ED Course  I have reviewed the triage vital signs and the nursing notes.  Pertinent labs & imaging results that were available during my care of the patient were reviewed by me and considered in my medical decision making (see chart for details).    MDM Rules/Calculators/A&P                      Patient here with epigastric pain.  States that she has some burning into her chest along with pain that takes her breath away, causing her to feel short of breath.  Denies any fevers.  Hx of HTN, but no hx of heart disease.  Has had associated vomiting and is fairly tender in the upper abdomen and RUQ.  Will check lipase, LFTs, and RUQ [place].  I am less suspicious of cardiac etiology, and more concerned for abdominal process.  Will treat pain and reassess.  Right upper quadrant ultrasound shows cholelithiasis without evidence of cholecystitis.  She does have a mild leukocytosis of 14.6, but is afebrile.  She did not have any elevation of her LFTs.  She does remain quite hypertensive, and complains of pain radiating into her back.  I think that the symptoms are likely more from her gallbladder possibly indigestion or gastritis.  I think less likely as dissection, but feel that CT is  indicated to rule this out.  I doubt ACS.  Initial troponin was 5, repeat troponin is 4.  No ischemic EKG findings.  CT shows no evidence of dissection.  We will plan for outpatient follow-up with gastroenterology and possibly general surgery.  Have advised patient of the plan.  Given her regular dose of blood pressure medication.  She has been noncompliant for quite some time.  I have urged her to be more compliant.  I have refilled her medication.  Final Clinical Impression(s) / ED Diagnoses Final diagnoses:  Calculus of gallbladder without cholecystitis without obstruction  Hypertension, unspecified type    Rx / DC Orders ED Discharge Orders         Ordered    spironolactone-hydrochlorothiazide (ALDACTAZIDE) 25-25 MG tablet  Daily     06/24/19 0623    HYDROcodone-acetaminophen (NORCO/VICODIN) 5-325 MG tablet  Every 6 hours PRN     06/24/19 0624    ondansetron (ZOFRAN ODT) 4 MG disintegrating tablet  Every 8 hours PRN     06/24/19 0624           Montine Circle, PA-C 06/24/19 [id number]    Orpah Greek, MD 06/24/19 0630

## 2019-06-24 NOTE — ED Triage Notes (Signed)
Pt in w/central cp "burning" and sob since 11pm last night. Pain radiates to back, and sob has gotten worse w/pain. States it began at 11pm last night when she laid down. Reports hx of indigestion, and states she vomited x 1 PTA. Denies any sick contacts, cough or fever. sats 100% RA. Pain worse w/deep breaths

## 2019-06-27 ENCOUNTER — Emergency Department (HOSPITAL_COMMUNITY)
Admission: EM | Admit: 2019-06-27 | Discharge: 2019-06-27 | Disposition: A | Discharge status: Home / Self Care | Payer: BLUE CROSS/BLUE SHIELD | Attending: Emergency Medicine | Admitting: Emergency Medicine

## 2019-06-27 ENCOUNTER — Emergency Department (HOSPITAL_COMMUNITY): Payer: BLUE CROSS/BLUE SHIELD

## 2019-06-27 ENCOUNTER — Other Ambulatory Visit: Payer: Self-pay

## 2019-06-27 DIAGNOSIS — K8050 Calculus of bile duct without cholangitis or cholecystitis without obstruction: Secondary | ICD-10-CM | POA: Diagnosis not present

## 2019-06-27 DIAGNOSIS — R5381 Other malaise: Secondary | ICD-10-CM | POA: Diagnosis not present

## 2019-06-27 DIAGNOSIS — K8080 Other cholelithiasis without obstruction: Secondary | ICD-10-CM

## 2019-06-27 DIAGNOSIS — I10 Essential (primary) hypertension: Secondary | ICD-10-CM

## 2019-06-27 DIAGNOSIS — Z7722 Contact with and (suspected) exposure to environmental tobacco smoke (acute) (chronic): Secondary | ICD-10-CM | POA: Diagnosis not present

## 2019-06-27 DIAGNOSIS — R1013 Epigastric pain: Secondary | ICD-10-CM | POA: Diagnosis not present

## 2019-06-27 DIAGNOSIS — Z79899 Other long term (current) drug therapy: Secondary | ICD-10-CM | POA: Insufficient documentation

## 2019-06-27 DIAGNOSIS — R519 Headache, unspecified: Secondary | ICD-10-CM | POA: Diagnosis present

## 2019-06-27 LAB — URINALYSIS, ROUTINE W REFLEX MICROSCOPIC
Bilirubin Urine: NEGATIVE
Glucose, UA: NEGATIVE mg/dL
Ketones, ur: NEGATIVE mg/dL
Nitrite: NEGATIVE
Protein, ur: NEGATIVE mg/dL
Specific Gravity, Urine: 1.015 (ref 1.005–1.030)
pH: 7 (ref 5.0–8.0)

## 2019-06-27 LAB — RAPID URINE DRUG SCREEN, HOSP PERFORMED
Amphetamines: NOT DETECTED
Barbiturates: NOT DETECTED
Benzodiazepines: NOT DETECTED
Cocaine: NOT DETECTED
Opiates: NOT DETECTED
Tetrahydrocannabinol: NOT DETECTED

## 2019-06-27 LAB — CBC
HCT: 40.4 % (ref 36.0–46.0)
Hemoglobin: 12.4 g/dL (ref 12.0–15.0)
MCH: 26.7 pg (ref 26.0–34.0)
MCHC: 30.7 g/dL (ref 30.0–36.0)
MCV: 86.9 fL (ref 80.0–100.0)
Platelets: 384 10*3/uL (ref 150–400)
RBC: 4.65 MIL/uL (ref 3.87–5.11)
RDW: 14.2 % (ref 11.5–15.5)
WBC: 14 10*3/uL — ABNORMAL HIGH (ref 4.0–10.5)
nRBC: 0 % (ref 0.0–0.2)

## 2019-06-27 LAB — I-STAT BETA HCG BLOOD, ED (MC, WL, AP ONLY): I-stat hCG, quantitative: <5 m[IU]/mL (ref <5)

## 2019-06-27 LAB — COMPREHENSIVE METABOLIC PANEL
ALT: 14 U/L (ref 0–44)
AST: 13 U/L — ABNORMAL LOW (ref 15–41)
Albumin: 3.6 g/dL (ref 3.5–5.0)
Alkaline Phosphatase: 80 U/L (ref 38–126)
Anion gap: 10 (ref 5–15)
BUN: 9 mg/dL (ref 6–20)
CO2: 26 mmol/L (ref 22–32)
Calcium: 9.3 mg/dL (ref 8.9–10.3)
Chloride: 100 mmol/L (ref 98–111)
Creatinine, Ser: 0.76 mg/dL (ref 0.44–1.00)
GFR calc Af Amer: >60 mL/min (ref >60)
GFR calc non Af Amer: >60 mL/min (ref >60)
Glucose, Bld: 108 mg/dL — ABNORMAL HIGH (ref 70–99)
Potassium: 3.4 mmol/L — ABNORMAL LOW (ref 3.5–5.1)
Sodium: 136 mmol/L (ref 135–145)
Total Bilirubin: 0.5 mg/dL (ref 0.3–1.2)
Total Protein: 7.5 g/dL (ref 6.5–8.1)

## 2019-06-27 LAB — LIPASE, BLOOD: Lipase: 25 U/L (ref 11–51)

## 2019-06-27 MED ORDER — SODIUM CHLORIDE 0.9% FLUSH: 3.0000 mL | Freq: Once | INTRAVENOUS | Status: DC

## 2019-06-27 MED ORDER — LABETALOL HCL 5 MG/ML IV SOLN: 20.0000 mg | INTRAVENOUS | Status: DC | PRN

## 2019-06-27 NOTE — ED Notes (Signed)
Pt verbalized understanding of discharge instructions. Follow up care and pain management reviewed, pt had no further questions. 

## 2019-06-27 NOTE — Discharge Instructions (Addendum)
The testing does not show any serious problems.  Continue taking the currently prescribed medicines, for pain, nausea and to treat your blood pressure.  You can try using Tylenol instead of the hydrocodone/acetaminophen for pain.  Call the surgeon for follow-up appointment regarding your gallbladder.  Since you still have symptoms of upper abdominal pain, you may need to have your gallbladder removed.  Follow-up with your primary care doctor about your blood pressure in a week or 2.  Return here if needed.

## 2019-06-27 NOTE — ED Notes (Signed)
Pt transported to Xray. 

## 2019-06-27 NOTE — ED Triage Notes (Signed)
Pt arrives pov with reports of multiple gallstones. Reports discharged home with antiemtics, BP medication and pain medications. Reports now generalized headache, BP [id number] systolic in triage. Pt also c/o epigastric pain and back pain.

## 2019-06-27 NOTE — ED Provider Notes (Signed)
[facility] EMERGENCY DEPARTMENT Provider Note   CSN: [id number] Arrival date & time: 06/27/19  1839     History Chief Complaint  Patient presents with  . Abdominal Pain    Rita Lamb is a 44 y.o. female.  HPI She presents for evaluation of abdominal and back pain.  She was evaluated in the ED, 3 days ago at that time had comprehensive evaluation including abdominal ultrasound and CT of the abdomen pelvis.  She was diagnosed with cholelithiasis, without evidence of cholecystitis.  It was noted that she was noncompliant with her antihypertensives so she was given prescription for Aldactazide.  Patient states she is here for persistent symptoms including headache, chest pain, upper abdominal pain, ongoing belching, and nausea.  She is concerned that she needs additional testing and treatment, and wonders why she was not given something to help dissolve her gallstones.  She has a follow-up appointment scheduled regular headache in the gallstones.  She states she is taking her prescribed antihypertensive, but not pain medication.  She is using the Zofran she was prescribed.  No known sick contacts.  She denies changes in bowel or urinary habits.  There are no other known modifying factors.    Past Medical History:  Diagnosis Date  . Arthritis    bilateral knee  . Asthma   . Bell's palsy   . Chronic hypertension complicating or reason for care during pregnancy 07/23/2015   Guidelines for Antenatal Testing and Sonography  (Revised 07/2012)  INDICATION U/S NST/BPP DELIVERY  CHTN - 642.03   Group I   BP < 140/90, no PIH, AGA,  nml AFV, +/- meds     Group II   BP > 140/90, on meds, no PIH, AGA, nml AFV  [ ]  20-[ ]  28- [ ] 34- [ ] 38  20-24-28-31-34-37  32//2 x wk  28//BPP wkly then 32//2 x wk  40 no meds; 39 meds  FLM or 39  Previous Stillbirth (> 28 wks) - V23.5 20-24-28-32-36 28//BPP wkly then 32//2 x wk 39   Baseline Labs:   AST/ALT 8/8 24 protein:  191    . Helicobacter  pylori (H. pylori) infection   . Hernia, umbilical   . Hypertension   . PCOS (polycystic ovarian syndrome)     Patient Active Problem List   Diagnosis Date Noted  . Right hand pain 09/05/2018  . Abnormal uterine bleeding (AUB) 03/02/2018  . Umbilical hernia without obstruction and without gangrene 01/31/2018  . Essential hypertension 01/31/2018  . Bilateral impacted cerumen 01/31/2018  . Bell's palsy 03/03/2017  . Biological false positive RPR test 07/23/2015  . History of abnormal cervical Pap smear 07/23/2015  . Irregular periods 02/28/2014  . Hirsutism 02/15/2012  . Astigmatism of left eye 12/05/2011  . Asthma, intermittent 10/07/2011  . Morbid obesity with BMI of 45.0-49.9, adult ([place]) 10/07/2011    Past Surgical History:  Procedure Laterality Date  . CRYOTHERAPY  1996   cervix  . LAPAROSCOPIC TUBAL LIGATION Bilateral 02/01/2016   Procedure: LAPAROSCOPIC Bilateral TUBAL LIGATION by Fulgation;  Surgeon: Chancy Milroy, MD;  Location: [facility];  Service: Gynecology;  Laterality: Bilateral;  . TONSILLECTOMY AND ADENOIDECTOMY       OB History    Gravida  3   Para  3   Term  3   Preterm      AB      Living  3     SAB      TAB  Ectopic      Multiple  0   Live Births  1           Family History  Problem Relation Age of Onset  . Diabetes Father   . Diabetes Sister   . Alcohol abuse Sister   . Colon cancer Paternal Grandfather   . Cancer Paternal Grandfather   . Stroke Paternal Grandfather   . Alcohol abuse Mother   . HIV Mother   . Diabetes Mother   . Dementia Maternal Grandmother   . Cancer Maternal Grandmother 32       colon,   . Cancer Maternal Grandfather   . Diabetes Maternal Grandfather   . Cancer Paternal Grandmother   . Heart disease Paternal Grandmother   . Diabetes Paternal Grandmother     Social History   Tobacco Use  . Smoking status: Passive Smoke Exposure - Never Smoker  . Smokeless tobacco: Never Used  Substance Use  Topics  . Alcohol use: No  . Drug use: No    Home Medications Prior to Admission medications   Medication Sig Start Date End Date Taking? Authorizing Provider  HYDROcodone-acetaminophen (NORCO/VICODIN) 5-325 MG tablet Take 1-2 tablets by mouth every 6 (six) hours as needed. 06/24/19   Montine Circle, PA-C  ibuprofen (ADVIL) 600 MG tablet Take 1 tablet (600 mg total) by mouth 4 (four) times daily. 09/05/18   Zenia Resides, MD  ondansetron (ZOFRAN ODT) 4 MG disintegrating tablet Take 1 tablet (4 mg total) by mouth every 8 (eight) hours as needed. 06/24/19   Montine Circle, PA-C  spironolactone-hydrochlorothiazide (ALDACTAZIDE) 25-25 MG tablet Take 1 tablet by mouth daily. 06/24/19   Montine Circle, PA-C  hydrochlorothiazide (HYDRODIURIL) 25 MG tablet Take 1 tablet (25 mg total) by mouth daily. 01/31/18 03/14/19  Bonnita Hollow, MD  spironolactone (ALDACTONE) 25 MG tablet Take 1 tablet (25 mg total) by mouth daily. 03/02/18 03/14/19  Bonnita Hollow, MD    Allergies    Sulfa antibiotics  Review of Systems   Review of Systems  All other systems reviewed and are negative.   Physical Exam Updated Vital Signs BP (!) 166/76 (BP Location: Right Arm)   Pulse 61   Temp 98.9 F (37.2 C) (Oral)   Resp 18   SpO2 100%   Physical Exam Vitals and nursing note reviewed.  Constitutional:      General: She is not in acute distress.    Appearance: She is well-developed. She is obese. She is not ill-appearing, toxic-appearing or diaphoretic.  HENT:     Head: Normocephalic and atraumatic.  Eyes:     Conjunctiva/sclera: Conjunctivae normal.     Pupils: Pupils are equal, round, and reactive to light.  Neck:     Trachea: Phonation normal.  Cardiovascular:     Rate and Rhythm: Normal rate and regular rhythm.  Pulmonary:     Effort: Pulmonary effort is normal.     Breath sounds: Normal breath sounds.  Chest:     Chest wall: No tenderness.  Abdominal:     General: There is no  distension.     Palpations: Abdomen is soft.     Tenderness: There is no abdominal tenderness. There is no guarding.  Musculoskeletal:        General: Normal range of motion.     Cervical back: Normal range of motion and neck supple.  Skin:    General: Skin is warm and dry.  Neurological:     Mental Status: She is  alert and oriented to person, place, and time.     Motor: No abnormal muscle tone.     Comments: No dysarthria or aphasia.  Psychiatric:        Mood and Affect: Mood normal.        Behavior: Behavior normal.        Thought Content: Thought content normal.        Judgment: Judgment normal.     ED Results / Procedures / Treatments   Labs (all labs ordered are listed, but only abnormal results are displayed) Labs Reviewed  COMPREHENSIVE METABOLIC PANEL - Abnormal; Notable for the following components:      Result Value   Potassium 3.4 (*)    Glucose, Bld 108 (*)    AST 13 (*)    All other components within normal limits  CBC - Abnormal; Notable for the following components:   WBC 14.0 (*)    All other components within normal limits  URINALYSIS, ROUTINE W REFLEX MICROSCOPIC - Abnormal; Notable for the following components:   APPearance CLOUDY (*)    Hgb urine dipstick SMALL (*)    Leukocytes,Ua LARGE (*)    Bacteria, UA MANY (*)    All other components within normal limits  LIPASE, BLOOD  RAPID URINE DRUG SCREEN, HOSP PERFORMED  I-STAT BETA HCG BLOOD, ED ([facility], [facility], [facility] ONLY)    EKG None  Radiology DG Chest 2 View  Result Date: 06/27/2019 CLINICAL DATA:  Chest pain. EXAM: CHEST - 2 VIEW COMPARISON:  06/24/2019 FINDINGS: The heart size and mediastinal contours are within normal limits. Both lungs are clear. The visualized skeletal structures are unremarkable. IMPRESSION: No active cardiopulmonary disease. Electronically Signed   By: Constance Holster M.D.   On: 06/27/2019 21:31   CT Head Wo Contrast  Result Date: 06/27/2019 CLINICAL DATA:  Acute headache with  normal neurological exam. EXAM: CT HEAD WITHOUT CONTRAST TECHNIQUE: Contiguous axial images were obtained from the base of the skull through the vertex without intravenous contrast. COMPARISON:  02/26/2017 FINDINGS: Brain: The brain shows a normal appearance without evidence of malformation, atrophy, old or acute small or large vessel infarction, mass lesion, hemorrhage, hydrocephalus or extra-axial collection. Vascular: No hyperdense vessel. No evidence of atherosclerotic calcification. Skull: Normal.  No traumatic finding.  No focal bone lesion. Sinuses/Orbits: Sinuses are clear. Orbits appear normal. Mastoids are clear. Other: Fluid opacification of many of the right mastoid air cells. Material filling the external auditory canal on the right and narrowing the canal on the left. IMPRESSION: No intracranial abnormality.  Normal appearance of the brain. Right mastoid effusion. Material filling the external auditory canal on the right and narrowing the canal on the left. Electronically Signed   By: Nelson Chimes M.D.   On: 06/27/2019 21:41    Procedures Procedures (including critical care time)  Medications Ordered in ED Medications  sodium chloride flush (NS) 0.9 % injection 3 mL (3 mLs Intravenous Not Given 06/27/19 1932)  labetalol (NORMODYNE) injection 20 mg (has no administration in time range)    ED Course  I have reviewed the triage vital signs and the nursing notes.  Pertinent labs & imaging results that were available during my care of the patient were reviewed by me and considered in my medical decision making (see chart for details).  Clinical Course as of Jun 26 2209  Thu Jun 27, 2019  2157 Normal  I-Stat beta hCG blood, ED [EW]  2206 Normal except for large leukocytes, presence of white  blood cells and bacteria and squamous epithelials indicating contamination.  Urinalysis, Routine w reflex microscopic(!) [EW]  2207 Normal except potassium low, glucose high  Comprehensive metabolic  panel(!) [EW]  [id number] Normal  Lipase, blood [EW]  2207 Normal  Urine rapid drug screen (hosp performed) [EW]  2207 Normal except white count elevated  CBC(!) [EW]    Clinical Course User Index [EW] Daleen Bo, MD   MDM Rules/Calculators/A&P                       Patient Vitals for the past 24 hrs:  BP Temp Temp src Pulse Resp SpO2  06/27/19 2159 -- -- -- -- -- 100 %  06/27/19 2159 (!) 166/76 -- -- 61 -- --  06/27/19 1842 (!) 204/121 98.9 F (37.2 C) Oral 75 18 100 %    10:11 PM Reevaluation with update and discussion. After initial assessment and treatment, an updated evaluation reveals blood pressure spontaneously improved so labetalol was not given.  Findings discussed with the patient.  She had no additional complaints at this time.  All questions answered. Daleen Bo   Medical Decision Making: Upper abdominal pain with chest and head pain.  Patient with mild hypertension, improved spontaneously.  Patient is anxious and worried about lack of improvement, but has a reassuring evaluation today.  Doubt cholecystitis, urinary tract infection, serious bacterial infection or metabolic instability.  Doubt hypertensive urgency or CVA.  Doubt ACS.  Patient is stable for discharge.  Rita Lamb was evaluated in Emergency Department on 06/27/2019 for the symptoms described in the history of present illness. She was evaluated in the context of the global COVID-19 pandemic, which necessitated consideration that the patient might be at risk for infection with the SARS-CoV-2 virus that causes COVID-19. Institutional protocols and algorithms that pertain to the evaluation of patients at risk for COVID-19 are in a state of rapid change based on information released by regulatory bodies including the CDC and federal and state organizations. These policies and algorithms were followed during the patient's care in the ED.   CRITICAL CARE-no Performed by: Daleen Bo   Nursing Notes  Reviewed/ Care Coordinated Applicable Imaging Reviewed Interpretation of Laboratory Data incorporated into ED treatment  The patient appears reasonably screened and/or stabilized for discharge and I doubt any other medical condition or other [facility] requiring further screening, evaluation, or treatment in the ED at this time prior to discharge.  Plan: Home Medications-continue current; Home Treatments-low-salt diet, rest, low-fat diet; return here if the recommended treatment, does not improve the symptoms; Recommended follow up-general surgery and GI, as needed for follow-up care.  PCP for blood pressure check in a week or 2.  \ Final Clinical Impression(s) / ED Diagnoses Final diagnoses:  Epigastric pain  Hypertension, unspecified type  Biliary calculus of other site without obstruction  Malaise    Rx / DC Orders ED Discharge Orders    None       Daleen Bo, MD 06/27/19 2213

## 2020-06-13 ENCOUNTER — Other Ambulatory Visit: Payer: Self-pay

## 2020-06-13 ENCOUNTER — Emergency Department (HOSPITAL_COMMUNITY)
Admission: EM | Admit: 2020-06-13 | Discharge: 2020-06-14 | Disposition: A | Discharge status: Home / Self Care | Payer: 59 | Attending: Emergency Medicine | Admitting: Emergency Medicine

## 2020-06-13 ENCOUNTER — Encounter (HOSPITAL_COMMUNITY): Payer: Self-pay | Admitting: Emergency Medicine

## 2020-06-13 ENCOUNTER — Emergency Department (HOSPITAL_COMMUNITY): Payer: 59

## 2020-06-13 DIAGNOSIS — I10 Essential (primary) hypertension: Secondary | ICD-10-CM | POA: Diagnosis not present

## 2020-06-13 DIAGNOSIS — R42 Dizziness and giddiness: Secondary | ICD-10-CM | POA: Diagnosis not present

## 2020-06-13 DIAGNOSIS — Z7722 Contact with and (suspected) exposure to environmental tobacco smoke (acute) (chronic): Secondary | ICD-10-CM | POA: Diagnosis not present

## 2020-06-13 DIAGNOSIS — J4520 Mild intermittent asthma, uncomplicated: Secondary | ICD-10-CM | POA: Insufficient documentation

## 2020-06-13 DIAGNOSIS — R220 Localized swelling, mass and lump, head: Secondary | ICD-10-CM | POA: Insufficient documentation

## 2020-06-13 DIAGNOSIS — Z79899 Other long term (current) drug therapy: Secondary | ICD-10-CM | POA: Diagnosis not present

## 2020-06-13 DIAGNOSIS — R519 Headache, unspecified: Secondary | ICD-10-CM | POA: Diagnosis not present

## 2020-06-13 LAB — COMPREHENSIVE METABOLIC PANEL
ALT: 12 U/L (ref 0–44)
AST: 15 U/L (ref 15–41)
Albumin: 3.6 g/dL (ref 3.5–5.0)
Alkaline Phosphatase: 86 U/L (ref 38–126)
Anion gap: 7 (ref 5–15)
BUN: 12 mg/dL (ref 6–20)
CO2: 29 mmol/L (ref 22–32)
Calcium: 8.8 mg/dL — ABNORMAL LOW (ref 8.9–10.3)
Chloride: 102 mmol/L (ref 98–111)
Creatinine, Ser: 0.8 mg/dL (ref 0.44–1.00)
GFR, Estimated: >60 mL/min (ref >60)
Glucose, Bld: 137 mg/dL — ABNORMAL HIGH (ref 70–99)
Potassium: 3.8 mmol/L (ref 3.5–5.1)
Sodium: 138 mmol/L (ref 135–145)
Total Bilirubin: 0.8 mg/dL (ref 0.3–1.2)
Total Protein: 7.3 g/dL (ref 6.5–8.1)

## 2020-06-13 LAB — DIFFERENTIAL
Abs Immature Granulocytes: 0.04 10*3/uL (ref 0.00–0.07)
Basophils Absolute: 0.1 10*3/uL (ref 0.0–0.1)
Basophils Relative: 0 %
Eosinophils Absolute: 0.2 10*3/uL (ref 0.0–0.5)
Eosinophils Relative: 2 %
Immature Granulocytes: 0 %
Lymphocytes Relative: 23 %
Lymphs Abs: 2.8 10*3/uL (ref 0.7–4.0)
Monocytes Absolute: 0.7 10*3/uL (ref 0.1–1.0)
Monocytes Relative: 6 %
Neutro Abs: 8.5 10*3/uL — ABNORMAL HIGH (ref 1.7–7.7)
Neutrophils Relative %: 69 %

## 2020-06-13 LAB — CBC
HCT: 38.3 % (ref 36.0–46.0)
Hemoglobin: 12.2 g/dL (ref 12.0–15.0)
MCH: 28.6 pg (ref 26.0–34.0)
MCHC: 31.9 g/dL (ref 30.0–36.0)
MCV: 89.9 fL (ref 80.0–100.0)
Platelets: 362 10*3/uL (ref 150–400)
RBC: 4.26 MIL/uL (ref 3.87–5.11)
RDW: 13.4 % (ref 11.5–15.5)
WBC: 12.3 10*3/uL — ABNORMAL HIGH (ref 4.0–10.5)
nRBC: 0 % (ref 0.0–0.2)

## 2020-06-13 MED ORDER — SODIUM CHLORIDE 0.9% FLUSH: 3.0000 mL | Freq: Once | INTRAVENOUS | Status: DC

## 2020-06-13 NOTE — ED Triage Notes (Signed)
Pt reports pain to back of head that radiates to base of skull since Thursday with dizziness, nausea, and feeling "fuzzy".  No arm drift. Also reports history of Bell's Palsy and states L lower lip is more swollen than usual.

## 2020-06-14 ENCOUNTER — Emergency Department (HOSPITAL_COMMUNITY): Payer: 59

## 2020-06-14 MED ORDER — MECLIZINE HCL 25 MG PO TABS
25.0000 mg | ORAL_TABLET | Freq: Once | ORAL | Status: AC
  Administered 2020-06-14: 25 mg via ORAL
  Filled 2020-06-14: qty 1

## 2020-06-14 MED ORDER — MECLIZINE HCL 25 MG PO TABS: 25.0000 mg | ORAL_TABLET | Freq: Three times a day (TID) | ORAL | 0 refills | Status: AC | PRN

## 2020-06-14 MED ORDER — ONDANSETRON HCL 4 MG/2ML IJ SOLN
4.0000 mg | Freq: Once | INTRAMUSCULAR | Status: AC
  Administered 2020-06-14: 4 mg via INTRAVENOUS
  Filled 2020-06-14: qty 2

## 2020-06-14 MED ORDER — ONDANSETRON 4 MG PO TBDP: 4.0000 mg | ORAL_TABLET | Freq: Three times a day (TID) | ORAL | 0 refills | Status: AC | PRN

## 2020-06-14 NOTE — ED Provider Notes (Signed)
[facility] EMERGENCY DEPARTMENT Provider Note   CSN: [id number] Arrival date & time: 06/13/20  1338     History Chief Complaint  Patient presents with  . Headache  . Dizziness    Rita Lamb is a 45 y.o. female.  Patient with past medical history notable for Bell's palsy presents to the emergency department with a chief complaint of headache and dizziness.  She states that the headache and dizziness started 3 days ago.  She reports significant dizziness anytime she stands or changes positions.  She also reports having posterior headache for the same amount of time.  She has tried taking OTC medications and also got an injection from urgent care with little relief.  She denies history of strokes.  She also states that her family told her that her Bell's palsy seems worse than normal.  She has chronic deficits to the left face.  She denies numbness, weakness, tingling of her extremities.  Denies any difficulty moving her extremities.  Denies slurred speech or vision changes.  The history is provided by the patient. No language interpreter was used.  Dizziness      Past Medical History:  Diagnosis Date  . Arthritis    bilateral knee  . Asthma   . Bell's palsy   . Chronic hypertension complicating or reason for care during pregnancy 07/23/2015   Guidelines for Antenatal Testing and Sonography  (Revised 07/2012)  INDICATION U/S NST/BPP DELIVERY  CHTN - 642.03   Group I   BP < 140/90, no PIH, AGA,  nml AFV, +/- meds     Group II   BP > 140/90, on meds, no PIH, AGA, nml AFV  [ ]  20-[ ]  28- [ ] 34- [ ] 38  20-24-28-31-34-37  32//2 x wk  28//BPP wkly then 32//2 x wk  40 no meds; 39 meds  FLM or 39  Previous Stillbirth (> 28 wks) - V23.5 20-24-28-32-36 28//BPP wkly then 32//2 x wk 39   Baseline Labs:   AST/ALT 8/8 24 protein:  191    . Helicobacter pylori (H. pylori) infection   . Hernia, umbilical   . Hypertension   . PCOS (polycystic ovarian syndrome)     Patient  Active Problem List   Diagnosis Date Noted  . Right hand pain 09/05/2018  . Abnormal uterine bleeding (AUB) 03/02/2018  . Umbilical hernia without obstruction and without gangrene 01/31/2018  . Essential hypertension 01/31/2018  . Bilateral impacted cerumen 01/31/2018  . Bell's palsy 03/03/2017  . Biological false positive RPR test 07/23/2015  . History of abnormal cervical Pap smear 07/23/2015  . Irregular periods 02/28/2014  . Hirsutism 02/15/2012  . Astigmatism of left eye 12/05/2011  . Asthma, intermittent 10/07/2011  . Morbid obesity with BMI of 45.0-49.9, adult ([place]) 10/07/2011    Past Surgical History:  Procedure Laterality Date  . CRYOTHERAPY  1996   cervix  . LAPAROSCOPIC TUBAL LIGATION Bilateral 02/01/2016   Procedure: LAPAROSCOPIC Bilateral TUBAL LIGATION by Fulgation;  Surgeon: Chancy Milroy, MD;  Location: [facility];  Service: Gynecology;  Laterality: Bilateral;  . TONSILLECTOMY AND ADENOIDECTOMY       OB History    Gravida  3   Para  3   Term  3   Preterm      AB      Living  3     SAB      IAB      Ectopic      Multiple  0  Live Births  1           Family History  Problem Relation Age of Onset  . Diabetes Father   . Diabetes Sister   . Alcohol abuse Sister   . Colon cancer Paternal Grandfather   . Cancer Paternal Grandfather   . Stroke Paternal Grandfather   . Alcohol abuse Mother   . HIV Mother   . Diabetes Mother   . Dementia Maternal Grandmother   . Cancer Maternal Grandmother 69       colon,   . Cancer Maternal Grandfather   . Diabetes Maternal Grandfather   . Cancer Paternal Grandmother   . Heart disease Paternal Grandmother   . Diabetes Paternal Grandmother     Social History   Tobacco Use  . Smoking status: Passive Smoke Exposure - Never Smoker  . Smokeless tobacco: Never Used  Vaping Use  . Vaping Use: Never used  Substance Use Topics  . Alcohol use: No  . Drug use: No    Home Medications Prior to Admission  medications   Medication Sig Start Date End Date Taking? Authorizing Provider  HYDROcodone-acetaminophen (NORCO/VICODIN) 5-325 MG tablet Take 1-2 tablets by mouth every 6 (six) hours as needed. Patient not taking: Reported on 06/27/2019 06/24/19   Montine Circle, PA-C  ibuprofen (ADVIL) 600 MG tablet Take 1 tablet (600 mg total) by mouth 4 (four) times daily. Patient not taking: Reported on 06/27/2019 09/05/18   Zenia Resides, MD  ondansetron (ZOFRAN ODT) 4 MG disintegrating tablet Take 1 tablet (4 mg total) by mouth every 8 (eight) hours as needed. Patient not taking: Reported on 06/27/2019 06/24/19   Montine Circle, PA-C  spironolactone-hydrochlorothiazide (ALDACTAZIDE) 25-25 MG tablet Take 1 tablet by mouth daily. 06/24/19   Montine Circle, PA-C  hydrochlorothiazide (HYDRODIURIL) 25 MG tablet Take 1 tablet (25 mg total) by mouth daily. 01/31/18 03/14/19  Bonnita Hollow, MD  spironolactone (ALDACTONE) 25 MG tablet Take 1 tablet (25 mg total) by mouth daily. 03/02/18 03/14/19  Bonnita Hollow, MD    Allergies    Sulfa antibiotics  Review of Systems   Review of Systems  All other systems reviewed and are negative.   Physical Exam Updated Vital Signs BP (!) 157/86 (BP Location: Right Arm)   Pulse 78   Temp 98.3 F (36.8 C)   Resp 18   LMP 05/23/2020   SpO2 100%   Physical Exam Vitals and nursing note reviewed.  Constitutional:      General: She is not in acute distress.    Appearance: She is well-developed and well-nourished.  HENT:     Head: Normocephalic and atraumatic.     Mouth/Throat:     Comments: Mild swelling of left lower lip Eyes:     Conjunctiva/sclera: Conjunctivae normal.  Cardiovascular:     Rate and Rhythm: Normal rate and regular rhythm.     Heart sounds: No murmur heard.   Pulmonary:     Effort: Pulmonary effort is normal. No respiratory distress.     Breath sounds: Normal breath sounds.  Abdominal:     Palpations: Abdomen is soft.      Tenderness: There is no abdominal tenderness.  Musculoskeletal:        General: No edema.     Cervical back: Neck supple.     Comments: Range of motion and strength of extremities is 5/5  Skin:    General: Skin is warm and dry.  Neurological:     Mental Status: She  is alert and oriented to person, place, and time.     Comments: Decreased tone to left side of face (chronic from Bell's palsy) Normal sensation and strength throughout her extremities   Psychiatric:        Mood and Affect: Mood and affect and mood normal.        Behavior: Behavior normal.     ED Results / Procedures / Treatments   Labs (all labs ordered are listed, but only abnormal results are displayed) Labs Reviewed  CBC - Abnormal; Notable for the following components:      Result Value   WBC 12.3 (*)    All other components within normal limits  DIFFERENTIAL - Abnormal; Notable for the following components:   Neutro Abs 8.5 (*)    All other components within normal limits  COMPREHENSIVE METABOLIC PANEL - Abnormal; Notable for the following components:   Glucose, Bld 137 (*)    Calcium 8.8 (*)    All other components within normal limits  PROTIME-INR  APTT    EKG None  Radiology CT HEAD WO CONTRAST  Result Date: 06/13/2020 CLINICAL DATA:  Acute to or deficit. EXAM: CT HEAD WITHOUT CONTRAST TECHNIQUE: Contiguous axial images were obtained from the base of the skull through the vertex without intravenous contrast. COMPARISON:  A per [id number] FINDINGS: Brain: No evidence of acute infarction, hemorrhage, hydrocephalus, or mass lesion/mass effect. 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 Vascular: No hyperdense vessel or unexpected calcification. Skull: Normal. Negative for fracture or focal lesion. Sinuses/Orbits: No acute finding. Other: None. IMPRESSION: 1. No acute intracranial abnormality. 2. 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 Electronically Signed   By: Ted Mcalpineobrinka  Dimitrova  M.D.   On: 06/13/2020 15:48    Procedures Procedures   Medications Ordered in ED Medications  sodium chloride flush (NS) 0.9 % injection 3 mL (has no administration in time range)  ondansetron (ZOFRAN) injection 4 mg (has no administration in time range)  meclizine (ANTIVERT) tablet 25 mg (has no administration in time range)    ED Course  I have reviewed the triage vital signs and the nursing notes.  Pertinent labs & imaging results that were available during my care of the patient were reviewed by me and considered in my medical decision making (see chart for details).    MDM Rules/Calculators/A&P                          This patient complains of headache and dizziness, this involves an extensive number of treatment options, and is a complaint that carries with it a high risk of complications and morbidity.    Differential Dx Stroke, vertigo, tension headache, dehydration, hypoglycemia  Pertinent Labs I reviewed and interpreted labs which were ordered in triage, mildly elevated white blood cell count (nonspecific), electrolytes are reassuring, kidney function normal.  Imaging Interpretation I ordered imaging studies which included MRI, which showed no acute findings.  I reviewed CT report which was ordered in triage, which showed no acute findings.  Medications I ordered medication Zofran and meclizine for nausea and dizziness.  Reassessments After the interventions stated above, I reevaluated the patient and found feeling significantly improved after meclizine and Zofran.  She is sitting up in bed texting, states that dizziness is much improved, but still mildly present.  Consultants None  Plan Discharge with outpatient follow-up.    Final Clinical Impression(s) / ED Diagnoses Final diagnoses:  Dizziness  Nonintractable headache, unspecified chronicity  pattern, unspecified headache type    Rx / DC Orders ED Discharge Orders    None       Montine Circle, PA-C 06/14/20 0348    Merryl Hacker, MD 06/14/20 [phone]

## 2020-06-14 NOTE — Discharge Instructions (Signed)
Your MRI showed no evidence of stroke.  You dizziness is thought to be vertigo.  Take the Meclizine every 8 hours as needed.  You can take the zofran for nausea.  Please follow-up with your doctor.

## 2020-12-11 ENCOUNTER — Emergency Department (HOSPITAL_COMMUNITY): Payer: 59

## 2020-12-11 ENCOUNTER — Encounter (HOSPITAL_COMMUNITY): Payer: Self-pay | Admitting: Emergency Medicine

## 2020-12-11 ENCOUNTER — Other Ambulatory Visit: Payer: Self-pay

## 2020-12-11 ENCOUNTER — Emergency Department (HOSPITAL_COMMUNITY)
Admission: EM | Admit: 2020-12-11 | Discharge: 2020-12-12 | Disposition: A | Discharge status: Home / Self Care | Payer: 59 | Attending: Emergency Medicine | Admitting: Emergency Medicine

## 2020-12-11 DIAGNOSIS — I10 Essential (primary) hypertension: Secondary | ICD-10-CM | POA: Diagnosis not present

## 2020-12-11 DIAGNOSIS — Z91040 Latex allergy status: Secondary | ICD-10-CM | POA: Diagnosis not present

## 2020-12-11 DIAGNOSIS — J4520 Mild intermittent asthma, uncomplicated: Secondary | ICD-10-CM | POA: Diagnosis not present

## 2020-12-11 DIAGNOSIS — Z79899 Other long term (current) drug therapy: Secondary | ICD-10-CM | POA: Diagnosis not present

## 2020-12-11 DIAGNOSIS — Z7722 Contact with and (suspected) exposure to environmental tobacco smoke (acute) (chronic): Secondary | ICD-10-CM | POA: Insufficient documentation

## 2020-12-11 DIAGNOSIS — U071 COVID-19: Secondary | ICD-10-CM | POA: Insufficient documentation

## 2020-12-11 DIAGNOSIS — R0602 Shortness of breath: Secondary | ICD-10-CM | POA: Diagnosis present

## 2020-12-11 LAB — COMPREHENSIVE METABOLIC PANEL
ALT: 29 U/L (ref 0–44)
AST: 29 U/L (ref 15–41)
Albumin: 3.6 g/dL (ref 3.5–5.0)
Alkaline Phosphatase: 72 U/L (ref 38–126)
Anion gap: 8 (ref 5–15)
BUN: 11 mg/dL (ref 6–20)
CO2: 25 mmol/L (ref 22–32)
Calcium: 8.5 mg/dL — ABNORMAL LOW (ref 8.9–10.3)
Chloride: 104 mmol/L (ref 98–111)
Creatinine, Ser: 0.78 mg/dL (ref 0.44–1.00)
GFR, Estimated: >60 mL/min (ref >60)
Glucose, Bld: 292 mg/dL — ABNORMAL HIGH (ref 70–99)
Potassium: 3.3 mmol/L — ABNORMAL LOW (ref 3.5–5.1)
Sodium: 137 mmol/L (ref 135–145)
Total Bilirubin: 0.4 mg/dL (ref 0.3–1.2)
Total Protein: 7.2 g/dL (ref 6.5–8.1)

## 2020-12-11 LAB — CBC
HCT: 38.3 % (ref 36.0–46.0)
Hemoglobin: 11.8 g/dL — ABNORMAL LOW (ref 12.0–15.0)
MCH: 27.8 pg (ref 26.0–34.0)
MCHC: 30.8 g/dL (ref 30.0–36.0)
MCV: 90.3 fL (ref 80.0–100.0)
Platelets: 294 10*3/uL (ref 150–400)
RBC: 4.24 MIL/uL (ref 3.87–5.11)
RDW: 14 % (ref 11.5–15.5)
WBC: 5.6 10*3/uL (ref 4.0–10.5)
nRBC: 0 % (ref 0.0–0.2)

## 2020-12-11 NOTE — ED Provider Notes (Signed)
Emergency Medicine Provider Triage Evaluation Note  Rita Lamb , a 45 y.o. female  was evaluated in triage.  Pt complains of COVID.  Tested positive 3 days ago.  Feels SOB.  Feels like symptoms worsened while cleaning her house today.  Has hx of asthma.  .  Review of Systems  Positive: SOB, cough Negative: Vomiting, Dysuria  Physical Exam  BP (!) 167/102 (BP Location: Left Arm)   Pulse (!) 55   Temp 98.3 F (36.8 C) (Oral)   Resp (!) 22   Wt 113.4 kg   LMP 11/18/2020   SpO2 95%   BMI 44.29 kg/m  Gen:   Awake, no distress   Resp:  Normal effort  MSK:   Moves extremities without difficulty  Other:    Medical Decision Making  Medically screening exam initiated at 10:28 PM.  Appropriate orders placed.  ROTUNDA GENSER was informed that the remainder of the evaluation will be completed by another provider, this initial triage assessment does not replace that evaluation, and the importance of remaining in the ED until their evaluation is complete.  SOB   Montine Circle, PA-C 12/11/20 2229    Gareth Morgan, MD 12/13/20 2325

## 2020-12-11 NOTE — ED Triage Notes (Signed)
C/o shob. Tested + for COVID on 8/2. Hx of asthma. Using home inhalers with no relief. Pt concerned she has pneumonia. Denies fevers.

## 2020-12-12 MED ORDER — NIRMATRELVIR/RITONAVIR (PAXLOVID)TABLET: 3.0000 | ORAL_TABLET | Freq: Two times a day (BID) | ORAL | 0 refills | Status: AC

## 2020-12-12 MED ORDER — BENZONATATE 100 MG PO CAPS: 100.0000 mg | ORAL_CAPSULE | Freq: Three times a day (TID) | ORAL | 0 refills | Status: DC

## 2020-12-12 MED ORDER — NIRMATRELVIR/RITONAVIR (PAXLOVID)TABLET: 3.0000 | ORAL_TABLET | Freq: Two times a day (BID) | ORAL | Status: DC

## 2020-12-12 MED ORDER — ALBUTEROL SULFATE HFA 108 (90 BASE) MCG/ACT IN AERS: 2.0000 | INHALATION_SPRAY | RESPIRATORY_TRACT | 0 refills | Status: AC | PRN

## 2020-12-12 NOTE — Discharge Instructions (Addendum)
Take the Paxlovid as directed.  I have also sent prescriptions for cough medicine and a refill of your inhaler to your pharmacy.  You can use the inhaler 2 puffs, every 4 hours for the next 2 to 3 days.  If your symptoms change or worsen, return to the emergency department.

## 2020-12-12 NOTE — ED Notes (Signed)
Pt ambulated with pulse ox. Sats remained 99 to 100%.  Pt in nad with ambulating but with some cough.

## 2020-12-12 NOTE — ED Provider Notes (Signed)
[facility] DEPT Provider Note   CSN: [id number] Arrival date & time: 12/11/20  2123     History Chief Complaint  Patient presents with   Shortness of Breath   Covid Positive    Rita Lamb is a 45 y.o. female.  Patient here with chief complaint of shortness of breath.  She states that she tested positive for COVID 3 days ago.  Reports associated cough.  States that her symptoms worsened while cleaning her house today.  She reports history of asthma.  Denies any vomiting.  Denies dysuria.  States that her fever has stopped.  States that she still feels worn out and short of breath and has coughing fits from time to time.  The history is provided by the patient. No language interpreter was used.      Past Medical History:  Diagnosis Date   Arthritis    bilateral knee   Asthma    Bell's palsy    Chronic hypertension complicating or reason for care during pregnancy 07/23/2015   Guidelines for Antenatal Testing and Sonography  (Revised 07/2012)  INDICATION U/S NST/BPP DELIVERY  CHTN - 642.03   Group I   BP < 140/90, no PIH, AGA,  nml AFV, +/- meds     Group II   BP > 140/90, on meds, no PIH, AGA, nml AFV  '[ ]'$  20-'[ ]'$  28- '[ ]'$ 34- '[ ]'$ 38  20-24-28-31-34-37  32//2 x wk  28//BPP wkly then 32//2 x wk  40 no meds; 39 meds  FLM or 39  Previous Stillbirth (> 28 wks) - V23.5 20-24-28-32-36 28//BPP wkly then 32//2 x wk 39   Baseline Labs:   AST/ALT 8/8 24 protein:  99991111     Helicobacter pylori (H. pylori) infection    Hernia, umbilical    Hypertension    PCOS (polycystic ovarian syndrome)     Patient Active Problem List   Diagnosis Date Noted   Right hand pain 09/05/2018   Abnormal uterine bleeding (AUB) 99991111   Umbilical hernia without obstruction and without gangrene 01/31/2018   Essential hypertension 01/31/2018   Bilateral impacted cerumen 01/31/2018   Bell's palsy 03/03/2017   Biological false positive RPR test 07/23/2015   History of abnormal  cervical Pap smear 07/23/2015   Irregular periods 02/28/2014   Hirsutism 02/15/2012   Astigmatism of left eye 12/05/2011   Asthma, intermittent 10/07/2011   Morbid obesity with BMI of 45.0-49.9, adult ([place]) 10/07/2011    Past Surgical History:  Procedure Laterality Date   CRYOTHERAPY  1996   cervix   LAPAROSCOPIC TUBAL LIGATION Bilateral 02/01/2016   Procedure: LAPAROSCOPIC Bilateral TUBAL LIGATION by Fulgation;  Surgeon: Chancy Milroy, MD;  Location: [facility];  Service: Gynecology;  Laterality: Bilateral;   TONSILLECTOMY AND ADENOIDECTOMY       OB History     Gravida  3   Para  3   Term  3   Preterm      AB      Living  3      SAB      IAB      Ectopic      Multiple  0   Live Births  1           Family History  Problem Relation Age of Onset   Diabetes Father    Diabetes Sister    Alcohol abuse Sister    Colon cancer Paternal Grandfather    Cancer Paternal Grandfather  Stroke Paternal Grandfather    Alcohol abuse Mother    HIV Mother    Diabetes Mother    Dementia Maternal Grandmother    Cancer Maternal Grandmother 33       colon,    Cancer Maternal Grandfather    Diabetes Maternal Grandfather    Cancer Paternal Grandmother    Heart disease Paternal Grandmother    Diabetes Paternal Grandmother     Social History   Tobacco Use   Smoking status: Passive Smoke Exposure - Never Smoker   Smokeless tobacco: Never  Vaping Use   Vaping Use: Never used  Substance Use Topics   Alcohol use: No   Drug use: No    Home Medications Prior to Admission medications   Medication Sig Start Date End Date Taking? Authorizing Provider  hydrochlorothiazide (HYDRODIURIL) 25 MG tablet Take 25 mg by mouth daily.    [provider]  lisinopril (ZESTRIL) 10 MG tablet Take 10 mg by mouth daily. 06/28/19   [provider]  meclizine (ANTIVERT) 25 MG tablet Take 1 tablet (25 mg total) by mouth 3 (three) times daily as needed for dizziness.  06/14/20   Montine Circle, PA-C  naproxen (NAPROSYN) 500 MG tablet Take 500 mg by mouth daily as needed for mild pain or headache.    [provider]  ondansetron (ZOFRAN ODT) 4 MG disintegrating tablet Take 1 tablet (4 mg total) by mouth every 8 (eight) hours as needed for nausea or vomiting. 06/14/20   Montine Circle, PA-C  spironolactone (ALDACTONE) 25 MG tablet Take 25 mg by mouth daily.    [provider]    Allergies    Latex and Sulfa antibiotics  Review of Systems   Review of Systems  All other systems reviewed and are negative.  Physical Exam Updated Vital Signs BP (!) 155/90   Pulse 70   Temp 98 F (36.7 C) (Oral)   Resp 20   Wt 113.4 kg   LMP 11/18/2020   SpO2 96%   BMI 44.29 kg/m   Physical Exam Vitals and nursing note reviewed.  Constitutional:      General: She is not in acute distress.    Appearance: She is well-developed.  HENT:     Head: Normocephalic and atraumatic.  Eyes:     Conjunctiva/sclera: Conjunctivae normal.  Cardiovascular:     Rate and Rhythm: Normal rate and regular rhythm.     Heart sounds: No murmur heard. Pulmonary:     Effort: Pulmonary effort is normal. No respiratory distress.     Breath sounds: Normal breath sounds.  Abdominal:     Palpations: Abdomen is soft.     Tenderness: There is no abdominal tenderness.  Musculoskeletal:        General: Normal range of motion.     Cervical back: Neck supple.  Skin:    General: Skin is warm and dry.  Neurological:     Mental Status: She is alert and oriented to person, place, and time.  Psychiatric:        Mood and Affect: Mood normal.        Behavior: Behavior normal.    ED Results / Procedures / Treatments   Labs (all labs ordered are listed, but only abnormal results are displayed) Labs Reviewed  CBC - Abnormal; Notable for the following components:      Result Value   Hemoglobin 11.8 (*)    All other components within normal limits  COMPREHENSIVE METABOLIC  PANEL - Abnormal;  Notable for the following components:   Potassium 3.3 (*)    Glucose, Bld 292 (*)    Calcium 8.5 (*)    All other components within normal limits    EKG EKG Interpretation  Date/Time:  Friday December 11 2020 22:46:34 EDT Ventricular Rate:  72 PR Interval:  157 QRS Duration: 112 QT Interval:  382 QTC Calculation: 418 R Axis:   10 Text Interpretation: Sinus rhythm Borderline intraventricular conduction delay 12 Lead; Mason-Likar No significant change since last tracing Confirmed by Isla Pence [phone]) on 12/12/2020 2:33:32 AM  Radiology DG Chest 2 View  Result Date: 12/11/2020 CLINICAL DATA:  Shortness of breath, COVID positive. EXAM: CHEST - 2 VIEW COMPARISON:  06/27/2019 FINDINGS: Heart and mediastinal contours are within normal limits. No focal opacities or effusions. No acute bony abnormality. IMPRESSION: Normal study. Electronically Signed   By: Rolm Baptise M.D.   On: 12/11/2020 22:25    Procedures Procedures   Medications Ordered in ED Medications  nirmatrelvir/ritonavir EUA (PAXLOVID) TABS 3 tablet (has no administration in time range)    ED Course  I have reviewed the triage vital signs and the nursing notes.  Pertinent labs & imaging results that were available during my care of the patient were reviewed by me and considered in my medical decision making (see chart for details).    MDM Rules/Calculators/A&P                           Patient here with shortness of breath.  She was diagnosed with COVID-19 3 days ago.  She is in no acute distress.  Chest x-ray shows no infiltrate.  She ambulates maintaining 99 to 100%.  Vital signs are stable.  Will discharge home with Paxlovid.  Recommend using the inhaler as needed.  Return precautions discussed.  Patient understands agrees the plan.  She is stable ready for discharge.  Rita Lamb was evaluated in Emergency Department on 12/12/2020 for the symptoms described in the history of present illness.  She was evaluated in the context of the global COVID-19 pandemic, which necessitated consideration that the patient might be at risk for infection with the SARS-CoV-2 virus that causes COVID-19. Institutional protocols and algorithms that pertain to the evaluation of patients at risk for COVID-19 are in a state of rapid change based on information released by regulatory bodies including the CDC and federal and state organizations. These policies and algorithms were followed during the patient's care in the ED.  Final Clinical Impression(s) / ED Diagnoses Final diagnoses:  [id number]    Rx / DC Orders ED Discharge Orders     None        Montine Circle, PA-C 12/12/20 [id number]    Isla Pence, MD 12/12/20 [phone]

## 2022-04-19 ENCOUNTER — Emergency Department (HOSPITAL_COMMUNITY)
Admission: EM | Admit: 2022-04-19 | Discharge: 2022-04-20 | Disposition: A | Discharge status: Home / Self Care | Payer: Medicaid Other | Attending: Emergency Medicine | Admitting: Emergency Medicine

## 2022-04-19 ENCOUNTER — Encounter (HOSPITAL_COMMUNITY): Payer: Self-pay

## 2022-04-19 ENCOUNTER — Other Ambulatory Visit: Payer: Self-pay

## 2022-04-19 DIAGNOSIS — Z79899 Other long term (current) drug therapy: Secondary | ICD-10-CM | POA: Insufficient documentation

## 2022-04-19 DIAGNOSIS — Z91040 Latex allergy status: Secondary | ICD-10-CM | POA: Insufficient documentation

## 2022-04-19 DIAGNOSIS — R739 Hyperglycemia, unspecified: Secondary | ICD-10-CM | POA: Diagnosis present

## 2022-04-19 DIAGNOSIS — J45909 Unspecified asthma, uncomplicated: Secondary | ICD-10-CM | POA: Diagnosis not present

## 2022-04-19 DIAGNOSIS — D72829 Elevated white blood cell count, unspecified: Secondary | ICD-10-CM | POA: Insufficient documentation

## 2022-04-19 DIAGNOSIS — E1165 Type 2 diabetes mellitus with hyperglycemia: Secondary | ICD-10-CM | POA: Diagnosis not present

## 2022-04-19 DIAGNOSIS — G510 Bell's palsy: Secondary | ICD-10-CM | POA: Diagnosis not present

## 2022-04-19 DIAGNOSIS — I10 Essential (primary) hypertension: Secondary | ICD-10-CM | POA: Diagnosis not present

## 2022-04-19 LAB — CBG MONITORING, ED: Glucose-Capillary: 446 mg/dL — ABNORMAL HIGH (ref 70–99)

## 2022-04-19 NOTE — ED Triage Notes (Signed)
Pt states that she has bells palsy and been on prednisone, did not take prescription correctly and now sugar is higher than normal

## 2022-04-19 NOTE — ED Provider Triage Note (Signed)
Emergency Medicine Provider Triage Evaluation Note  Rita Lamb , a 46 y.o. female  was evaluated in triage.  Pt complains of hyperglycemia.  Recently put on prednisone 04/15/22 for bells palsy and sugars have been elevated since then.  Does have urinary frequency and increased thirst.  Review of Systems  Positive: hyperglycemia Negative: fever  Physical Exam  BP (!) 158/87 (BP Location: Right Arm)   Pulse 84   Temp 98.3 F (36.8 C)   Resp 18   SpO2 97%  Gen:   Awake, no distress   Resp:  Normal effort  MSK:   Moves extremities without difficulty  Other:    Medical Decision Making  Medically screening exam initiated at 11:49 PM.  Appropriate orders placed.  Rita Lamb was informed that the remainder of the evaluation will be completed by another provider, this initial triage assessment does not replace that evaluation, and the importance of remaining in the ED until their evaluation is complete.  Hyperglycemia.  Known diabetic, has been on prednisone for bells palsy.  CBG in triage 446.  Will check labs to screen for DKA.   Larene Pickett, PA-C 04/19/22 2352

## 2022-04-20 LAB — CBC WITH DIFFERENTIAL/PLATELET
Abs Immature Granulocytes: 0.18 10*3/uL — ABNORMAL HIGH (ref 0.00–0.07)
Basophils Absolute: 0.1 10*3/uL (ref 0.0–0.1)
Basophils Relative: 1 %
Eosinophils Absolute: 0.1 10*3/uL (ref 0.0–0.5)
Eosinophils Relative: 1 %
HCT: 37.9 % (ref 36.0–46.0)
Hemoglobin: 11.9 g/dL — ABNORMAL LOW (ref 12.0–15.0)
Immature Granulocytes: 1 %
Lymphocytes Relative: 28 %
Lymphs Abs: 5.3 10*3/uL — ABNORMAL HIGH (ref 0.7–4.0)
MCH: 27.1 pg (ref 26.0–34.0)
MCHC: 31.4 g/dL (ref 30.0–36.0)
MCV: 86.3 fL (ref 80.0–100.0)
Monocytes Absolute: 1.1 10*3/uL — ABNORMAL HIGH (ref 0.1–1.0)
Monocytes Relative: 6 %
Neutro Abs: 12.4 10*3/uL — ABNORMAL HIGH (ref 1.7–7.7)
Neutrophils Relative %: 63 %
Platelets: 451 10*3/uL — ABNORMAL HIGH (ref 150–400)
RBC: 4.39 MIL/uL (ref 3.87–5.11)
RDW: 14.3 % (ref 11.5–15.5)
Smear Review: NORMAL
WBC: 19.2 10*3/uL — ABNORMAL HIGH (ref 4.0–10.5)
nRBC: 0 % (ref 0.0–0.2)

## 2022-04-20 LAB — URINALYSIS, ROUTINE W REFLEX MICROSCOPIC
Bacteria, UA: NONE SEEN
Bilirubin Urine: NEGATIVE
Glucose, UA: >=500 mg/dL — AB
Hgb urine dipstick: NEGATIVE
Ketones, ur: NEGATIVE mg/dL
Nitrite: NEGATIVE
Protein, ur: NEGATIVE mg/dL
Specific Gravity, Urine: 1.031 — ABNORMAL HIGH (ref 1.005–1.030)
pH: 5 (ref 5.0–8.0)

## 2022-04-20 LAB — I-STAT VENOUS BLOOD GAS, ED
Acid-Base Excess: 3 mmol/L — ABNORMAL HIGH (ref 0.0–2.0)
Bicarbonate: 27.1 mmol/L (ref 20.0–28.0)
Calcium, Ion: 1.11 mmol/L — ABNORMAL LOW (ref 1.15–1.40)
HCT: 39 % (ref 36.0–46.0)
Hemoglobin: 13.3 g/dL (ref 12.0–15.0)
O2 Saturation: 81 %
Potassium: 3.8 mmol/L (ref 3.5–5.1)
Sodium: 134 mmol/L — ABNORMAL LOW (ref 135–145)
TCO2: 28 mmol/L (ref 22–32)
pCO2, Ven: 37.9 mmHg — ABNORMAL LOW (ref 44–60)
pH, Ven: 7.462 — ABNORMAL HIGH (ref 7.25–7.43)
pO2, Ven: 42 mmHg (ref 32–45)

## 2022-04-20 LAB — COMPREHENSIVE METABOLIC PANEL
ALT: 19 U/L (ref 0–44)
AST: 16 U/L (ref 15–41)
Albumin: 3.8 g/dL (ref 3.5–5.0)
Alkaline Phosphatase: 95 U/L (ref 38–126)
Anion gap: 14 (ref 5–15)
BUN: 18 mg/dL (ref 6–20)
CO2: 24 mmol/L (ref 22–32)
Calcium: 9.3 mg/dL (ref 8.9–10.3)
Chloride: 96 mmol/L — ABNORMAL LOW (ref 98–111)
Creatinine, Ser: 1.09 mg/dL — ABNORMAL HIGH (ref 0.44–1.00)
GFR, Estimated: >60 mL/min (ref >60)
Glucose, Bld: 413 mg/dL — ABNORMAL HIGH (ref 70–99)
Potassium: 3.9 mmol/L (ref 3.5–5.1)
Sodium: 134 mmol/L — ABNORMAL LOW (ref 135–145)
Total Bilirubin: 0.2 mg/dL — ABNORMAL LOW (ref 0.3–1.2)
Total Protein: 7.5 g/dL (ref 6.5–8.1)

## 2022-04-20 LAB — CBG MONITORING, ED
Glucose-Capillary: 333 mg/dL — ABNORMAL HIGH (ref 70–99)
Glucose-Capillary: 311 mg/dL — ABNORMAL HIGH (ref 70–99)
Glucose-Capillary: 260 mg/dL — ABNORMAL HIGH (ref 70–99)

## 2022-04-20 LAB — I-STAT BETA HCG BLOOD, ED (MC, WL, AP ONLY): I-stat hCG, quantitative: <5 m[IU]/mL (ref <5)

## 2022-04-20 LAB — BETA-HYDROXYBUTYRIC ACID: Beta-Hydroxybutyric Acid: 0.16 mmol/L (ref 0.05–0.27)

## 2022-04-20 MED ORDER — METFORMIN HCL 1000 MG PO TABS: 1000.0000 mg | ORAL_TABLET | Freq: Two times a day (BID) | ORAL | 0 refills | Status: DC

## 2022-04-20 MED ORDER — VALACYCLOVIR HCL 1 G PO TABS: 1000.0000 mg | ORAL_TABLET | Freq: Three times a day (TID) | ORAL | 0 refills | Status: AC

## 2022-04-20 MED ORDER — VALACYCLOVIR HCL 1 G PO TABS: 1000.0000 mg | ORAL_TABLET | Freq: Three times a day (TID) | ORAL | 0 refills | Status: DC

## 2022-04-20 MED ORDER — HYDROCODONE-ACETAMINOPHEN 5-325 MG PO TABS: 2.0000 | ORAL_TABLET | Freq: Four times a day (QID) | ORAL | 0 refills | Status: AC | PRN

## 2022-04-20 MED ORDER — METFORMIN HCL 1000 MG PO TABS: 1000.0000 mg | ORAL_TABLET | Freq: Two times a day (BID) | ORAL | 0 refills | Status: AC

## 2022-04-20 NOTE — ED Provider Notes (Signed)
[facility] EMERGENCY DEPARTMENT Provider Note   CSN: [id number] Arrival date & time: 04/19/22  2324     History  No chief complaint on file.   Rita Lamb is a 46 y.o. female.  46 year old female with past medical history significant for Bell's palsy, diabetes, hypertension presents today for evaluation of elevated blood sugars.  She states late last week she developed a flareup of her Bell's palsy which she has intermittently over the past 10 years.  She denies any weakness, numbness, difficulty with speech, balance issues, visual change.  She states she noticed drooping of her mouth on the left side, pain on the left side.  These are typical symptoms for her Bell's palsy flareup.  She states she saw a provider at the urgent care on Friday who prescribed her a prednisone taper.  She states she typically sees her primary care provider but she was unable to get an appointment until January 2.  She states that she typically takes 10 mg of prednisone and that is sufficient to resolve her symptoms.  She states instead of taking the prednisone as prescribed which was 60 mg for a few days before it tapered she took 20 mg as she was concerned about taking the higher doses.  She does not routinely check her blood sugar.  She is on metformin 500 mg twice daily.  She states however she got to a point where she was urinating frequently yesterday and also could not quench her thirst.  She checked her blood sugar and was noted to be around 600.  She came in for evaluation at that time.  Also reports significant pain on the left side of her face.  Has been taking Goody powders which has been helping.  States Tylenol and ibuprofen does not provide relief.  The history is provided by the patient and medical records. No language interpreter was used.       Home Medications Prior to Admission medications   Medication Sig Start Date End Date Taking? Authorizing Provider  albuterol  (VENTOLIN HFA) 108 (90 Base) MCG/ACT inhaler Inhale 2 puffs into the lungs every 4 (four) hours as needed for wheezing or shortness of breath. 12/12/20   Montine Circle, PA-C  benzonatate (TESSALON) 100 MG capsule Take 1 capsule (100 mg total) by mouth every 8 (eight) hours. 12/12/20   Montine Circle, PA-C  hydrochlorothiazide (HYDRODIURIL) 25 MG tablet Take 25 mg by mouth daily.    [provider]  lisinopril (ZESTRIL) 10 MG tablet Take 10 mg by mouth daily. 06/28/19   [provider]  meclizine (ANTIVERT) 25 MG tablet Take 1 tablet (25 mg total) by mouth 3 (three) times daily as needed for dizziness. 06/14/20   Montine Circle, PA-C  naproxen (NAPROSYN) 500 MG tablet Take 500 mg by mouth daily as needed for mild pain or headache.    [provider]  ondansetron (ZOFRAN ODT) 4 MG disintegrating tablet Take 1 tablet (4 mg total) by mouth every 8 (eight) hours as needed for nausea or vomiting. 06/14/20   Montine Circle, PA-C  spironolactone (ALDACTONE) 25 MG tablet Take 25 mg by mouth daily.    [provider]      Allergies    Latex and Sulfa antibiotics    Review of Systems   Review of Systems  Eyes:  Negative for visual disturbance.  Neurological:  Positive for facial asymmetry and numbness. Negative for speech difficulty, weakness and headaches.  All other systems reviewed and  are negative.   Physical Exam Updated Vital Signs BP (!) 148/95   Pulse 74   Temp 98.2 F (36.8 C) (Oral)   Resp 17   SpO2 96%  Physical Exam Vitals and nursing note reviewed.  Constitutional:      General: She is not in acute distress.    Appearance: Normal appearance. She is obese. She is not ill-appearing.  HENT:     Head: Normocephalic and atraumatic.     Nose: Nose normal.  Eyes:     General: No scleral icterus.    Extraocular Movements: Extraocular movements intact.     Conjunctiva/sclera: Conjunctivae normal.     Pupils: Pupils are equal, round, and reactive to  light.  Cardiovascular:     Rate and Rhythm: Normal rate and regular rhythm.     Pulses: Normal pulses.  Pulmonary:     Effort: Pulmonary effort is normal. No respiratory distress.     Breath sounds: Normal breath sounds. No wheezing or rales.  Abdominal:     General: There is no distension.     Palpations: Abdomen is soft.     Tenderness: There is no abdominal tenderness. There is no guarding.  Musculoskeletal:        General: Normal range of motion.     Cervical back: Normal range of motion.  Skin:    General: Skin is warm and dry.  Neurological:     General: No focal deficit present.     Mental Status: She is alert and oriented to person, place, and time. Mental status is at baseline.     Comments: Facial droop noted on the left side.  Mild lip swelling on the left side.  Without forehead sparing on the left. otherwise full range of motion bilateral upper and lower extremities with 5/5 strength.  Without pronator drift.  Tongue midline.  Finger-to-nose normal.  Sensation intact and symmetrical in bilateral upper and lower extremities including the face.     ED Results / Procedures / Treatments   Labs (all labs ordered are listed, but only abnormal results are displayed) Labs Reviewed  CBC WITH DIFFERENTIAL/PLATELET - Abnormal; Notable for the following components:      Result Value   WBC 19.2 (*)    Hemoglobin 11.9 (*)    Platelets 451 (*)    Neutro Abs 12.4 (*)    Lymphs Abs 5.3 (*)    Monocytes Absolute 1.1 (*)    Abs Immature Granulocytes 0.18 (*)    All other components within normal limits  COMPREHENSIVE METABOLIC PANEL - Abnormal; Notable for the following components:   Sodium 134 (*)    Chloride 96 (*)    Glucose, Bld 413 (*)    Creatinine, Ser 1.09 (*)    Total Bilirubin 0.2 (*)    All other components within normal limits  URINALYSIS, ROUTINE W REFLEX MICROSCOPIC - Abnormal; Notable for the following components:   Color, Urine STRAW (*)    Specific Gravity,  Urine 1.031 (*)    Glucose, UA >=500 (*)    Leukocytes,Ua TRACE (*)    All other components within normal limits  CBG MONITORING, ED - Abnormal; Notable for the following components:   Glucose-Capillary 446 (*)    All other components within normal limits  I-STAT VENOUS BLOOD GAS, ED - Abnormal; Notable for the following components:   pH, Ven 7.462 (*)    pCO2, Ven 37.9 (*)    Acid-Base Excess 3.0 (*)    Sodium 134 (*)  Calcium, Ion 1.11 (*)    All other components within normal limits  CBG MONITORING, ED - Abnormal; Notable for the following components:   Glucose-Capillary 311 (*)    All other components within normal limits  CBG MONITORING, ED - Abnormal; Notable for the following components:   Glucose-Capillary 333 (*)    All other components within normal limits  CBG MONITORING, ED - Abnormal; Notable for the following components:   Glucose-Capillary 260 (*)    All other components within normal limits  BETA-HYDROXYBUTYRIC ACID  HEMOGLOBIN A1C  I-STAT BETA HCG BLOOD, ED ([facility], [facility], [facility] ONLY)    EKG EKG Interpretation  Date/Time:  Tuesday April 19 2022 23:40:00 EST Ventricular Rate:  78 PR Interval:  148 QRS Duration: 102 QT Interval:  366 QTC Calculation: 417 R Axis:   28 Text Interpretation: Normal sinus rhythm Cannot rule out Anterior infarct , age undetermined Abnormal ECG No significant change since last tracing Confirmed by Leanord Asal ([id number]) on 04/20/2022 10:38:15 AM  Radiology No results found.  Procedures Procedures    Medications Ordered in ED Medications - No data to display  ED Course/ Medical Decision Making/ A&P                           Medical Decision Making Risk Prescription drug management.   Medical Decision Making / ED Course   This patient presents to the ED for concern of elevated blood sugar, left-sided facial paralysis, this involves an extensive number of treatment options, and is a complaint that carries with it a high  risk of complications and morbidity.  The differential diagnosis includes TIA, CVA, Bell's palsy, DKA, hypoglycemia, HHS  MDM: 46 year old female presents with above-mentioned plan.  Overall well-appearing.  Workup that was obtained through [facility] demonstrates CBC with leukocytosis of 19.2 without left shift, hemoglobin of 11.9 which is around patient's baseline.  Leukocytosis likely reactive recent prednisone use.  CMP without acute concerns.  Does show glucose of 413.  This was around midnight.  Since then it has been downtrending most recently 311.  Will repeat.  VBG without acute concerns.  UA without evidence of UTI.  Beta hydroxybutyrate acid within normal limits.  Without elevated anion gap.  No signs of DKA or HHS.  Repeat blood sugar of 260.  Neurological exam overall reassuring.  No suspicion for TIA or CVA.  Patient states all of the symptoms are consistent with her prior Bell's palsy and "I am not having a stroke".  Will add on valacyclovir to patient's regimen.  Discussed continuing prednisone but keeping a close eye on her blood sugars.  She will follow-up with her primary care provider.  Patient is appropriate for discharge.  Discharged in stable condition.  Return precaution discussed.  Patient voices understanding and is in agreement with plan.  Patient currently taking metformin 500 mg twice daily.  Will increase this to 1000 mg twice daily.   Lab Tests: -I ordered, reviewed, and interpreted labs.   The pertinent results include:   Labs Reviewed  CBC WITH DIFFERENTIAL/PLATELET - Abnormal; Notable for the following components:      Result Value   WBC 19.2 (*)    Hemoglobin 11.9 (*)    Platelets 451 (*)    Neutro Abs 12.4 (*)    Lymphs Abs 5.3 (*)    Monocytes Absolute 1.1 (*)    Abs Immature Granulocytes 0.18 (*)    All other components within normal limits  COMPREHENSIVE METABOLIC PANEL - Abnormal; Notable for the following components:   Sodium 134 (*)    Chloride 96 (*)     Glucose, Bld 413 (*)    Creatinine, Ser 1.09 (*)    Total Bilirubin 0.2 (*)    All other components within normal limits  URINALYSIS, ROUTINE W REFLEX MICROSCOPIC - Abnormal; Notable for the following components:   Color, Urine STRAW (*)    Specific Gravity, Urine 1.031 (*)    Glucose, UA >=500 (*)    Leukocytes,Ua TRACE (*)    All other components within normal limits  CBG MONITORING, ED - Abnormal; Notable for the following components:   Glucose-Capillary 446 (*)    All other components within normal limits  I-STAT VENOUS BLOOD GAS, ED - Abnormal; Notable for the following components:   pH, Ven 7.462 (*)    pCO2, Ven 37.9 (*)    Acid-Base Excess 3.0 (*)    Sodium 134 (*)    Calcium, Ion 1.11 (*)    All other components within normal limits  CBG MONITORING, ED - Abnormal; Notable for the following components:   Glucose-Capillary 311 (*)    All other components within normal limits  CBG MONITORING, ED - Abnormal; Notable for the following components:   Glucose-Capillary 333 (*)    All other components within normal limits  CBG MONITORING, ED - Abnormal; Notable for the following components:   Glucose-Capillary 260 (*)    All other components within normal limits  BETA-HYDROXYBUTYRIC ACID  HEMOGLOBIN A1C  I-STAT BETA HCG BLOOD, ED ([facility], [facility], [facility] ONLY)      EKG  EKG Interpretation  Date/Time:  Tuesday April 19 2022 23:40:00 EST Ventricular Rate:  78 PR Interval:  148 QRS Duration: 102 QT Interval:  366 QTC Calculation: 417 R Axis:   28 Text Interpretation: Normal sinus rhythm Cannot rule out Anterior infarct , age undetermined Abnormal ECG No significant change since last tracing Confirmed by Leanord Asal ([id number]) on 04/20/2022 10:38:15 AM         Medicines ordered and prescription drug management: No orders of the defined types were placed in this encounter.   -I have reviewed the patients home medicines and have made adjustments as  needed   Reevaluation: After the interventions noted above, I reevaluated the patient and found that they have :improved  Co morbidities that complicate the patient evaluation  Past Medical History:  Diagnosis Date   Arthritis    bilateral knee   Asthma    Bell's palsy    Chronic hypertension complicating or reason for care during pregnancy 07/23/2015   Guidelines for Antenatal Testing and Sonography  (Revised 07/2012)  INDICATION U/S NST/BPP DELIVERY  CHTN - 642.03   Group I   BP < 140/90, no PIH, AGA,  nml AFV, +/- meds     Group II   BP > 140/90, on meds, no PIH, AGA, nml AFV  '[ ]'$  20-'[ ]'$  28- '[ ]'$ 34- '[ ]'$ 38  20-24-28-31-34-37  32//2 x wk  28//BPP wkly then 32//2 x wk  40 no meds; 39 meds  FLM or 39  Previous Stillbirth (> 28 wks) - V23.5 20-24-28-32-36 28//BPP wkly then 32//2 x wk 39   Baseline Labs:   AST/ALT 8/8 24 protein:  244     Helicobacter pylori (H. pylori) infection    Hernia, umbilical    Hypertension    PCOS (polycystic ovarian syndrome)       Dispostion: Patient is appropriate for discharge.  Discharged in stable condition peer return precaution discussed.  Patient voices understanding and is in agreement with plan.  Final Clinical Impression(s) / ED Diagnoses Final diagnoses:  Bell's palsy  Hyperglycemia    Rx / DC Orders ED Discharge Orders          Ordered    valACYclovir (VALTREX) 1000 MG tablet  3 times daily,   Status:  Discontinued        04/20/22 1457    metFORMIN (GLUCOPHAGE) 1000 MG tablet  2 times daily,   Status:  Discontinued        04/20/22 1457    metFORMIN (GLUCOPHAGE) 1000 MG tablet  2 times daily        04/20/22 1549    valACYclovir (VALTREX) 1000 MG tablet  3 times daily        04/20/22 1549    HYDROcodone-acetaminophen (NORCO/VICODIN) 5-325 MG tablet  Every 6 hours PRN        04/20/22 1549              Evlyn Courier, PA-C 04/20/22 1552    Carmin Muskrat, MD 04/20/22 [phone]

## 2022-04-20 NOTE — ED Notes (Signed)
CBG 311

## 2022-04-20 NOTE — Discharge Instructions (Addendum)
Your workup today was overall reassuring.  During your stay your blood sugars improved to the 260.  You did not require any intervention for this.  You can continue taking your prednisone.  You stated you typically take 10 mg and were prescribed a higher dose.  You are able to break the 20 mg tablets in half and take the 10 mg if that has worked for you in the past.  Keep a close eye on your blood sugars.  I have sent an increased dose of metformin into the pharmacy for you.  Follow-up with your primary care provider and they can decide if further adjustment needs to be made to your blood sugar medication regimen.  I have also sent in valacyclovir from the Bell's palsy standpoint.  For any concerning symptoms return to the emergency room otherwise follow-up with your primary care provider. Alos I sent in a short course of pain medicine for you to take for severe or breakthrough pain. Do not drive after taking this.

## 2022-04-20 NOTE — Inpatient Diabetes Management (Signed)
Inpatient Diabetes Program Recommendations  AACE/ADA: New Consensus Statement on Inpatient Glycemic Control (2015)  Target Ranges:  Prepandial:   less than 140 mg/dL      Peak postprandial:   less than 180 mg/dL (1-2 hours)      Critically ill patients:  140 - 180 mg/dL   Lab Results  Component Value Date   GLUCAP 311 (H) 04/20/2022   HGBA1C 5.6 04/03/2017    Review of Glycemic Control  Diabetes history: DM2 Outpatient Diabetes medications: None Current orders for Inpatient glycemic control: None  Inpatient Diabetes Program Recommendations:   Pending A1c drawn 04/19/22. Noted patient recently on steroids recently due to [place].  Add Glycemic control order set with Novolog   Thank you, Nani Gasser. Tangi Shroff, RN, MSN, CDE  Diabetes Coordinator Inpatient Glycemic Control Team Team Pager [phone] (8am-5pm) 04/20/2022 1:12 PM

## 2022-04-21 LAB — HEMOGLOBIN A1C
Hgb A1c MFr Bld: 10.1 % — ABNORMAL HIGH (ref 4.8–5.6)
Mean Plasma Glucose: 243 mg/dL

## 2022-09-21 ENCOUNTER — Encounter (HOSPITAL_BASED_OUTPATIENT_CLINIC_OR_DEPARTMENT_OTHER): Payer: Self-pay | Admitting: Emergency Medicine

## 2022-09-21 ENCOUNTER — Other Ambulatory Visit: Payer: Self-pay

## 2022-09-21 ENCOUNTER — Emergency Department (HOSPITAL_BASED_OUTPATIENT_CLINIC_OR_DEPARTMENT_OTHER)
Admission: EM | Admit: 2022-09-21 | Discharge: 2022-09-21 | Disposition: A | Discharge status: Home / Self Care | Payer: Medicaid Other | Attending: Emergency Medicine | Admitting: Emergency Medicine

## 2022-09-21 ENCOUNTER — Emergency Department (HOSPITAL_BASED_OUTPATIENT_CLINIC_OR_DEPARTMENT_OTHER): Payer: Medicaid Other

## 2022-09-21 DIAGNOSIS — J45909 Unspecified asthma, uncomplicated: Secondary | ICD-10-CM | POA: Insufficient documentation

## 2022-09-21 DIAGNOSIS — Z79899 Other long term (current) drug therapy: Secondary | ICD-10-CM | POA: Diagnosis not present

## 2022-09-21 DIAGNOSIS — E119 Type 2 diabetes mellitus without complications: Secondary | ICD-10-CM | POA: Insufficient documentation

## 2022-09-21 DIAGNOSIS — Z91040 Latex allergy status: Secondary | ICD-10-CM | POA: Diagnosis not present

## 2022-09-21 DIAGNOSIS — I10 Essential (primary) hypertension: Secondary | ICD-10-CM | POA: Diagnosis not present

## 2022-09-21 DIAGNOSIS — Z7984 Long term (current) use of oral hypoglycemic drugs: Secondary | ICD-10-CM | POA: Insufficient documentation

## 2022-09-21 DIAGNOSIS — R0789 Other chest pain: Secondary | ICD-10-CM | POA: Diagnosis not present

## 2022-09-21 DIAGNOSIS — R079 Chest pain, unspecified: Secondary | ICD-10-CM | POA: Diagnosis present

## 2022-09-21 LAB — CBC WITH DIFFERENTIAL/PLATELET
Abs Immature Granulocytes: 0.06 10*3/uL (ref 0.00–0.07)
Basophils Absolute: 0 10*3/uL (ref 0.0–0.1)
Basophils Relative: 0 %
Eosinophils Absolute: 0.3 10*3/uL (ref 0.0–0.5)
Eosinophils Relative: 2 %
HCT: 34.9 % — ABNORMAL LOW (ref 36.0–46.0)
Hemoglobin: 10.7 g/dL — ABNORMAL LOW (ref 12.0–15.0)
Immature Granulocytes: 1 %
Lymphocytes Relative: 28 %
Lymphs Abs: 3.3 10*3/uL (ref 0.7–4.0)
MCH: 26.7 pg (ref 26.0–34.0)
MCHC: 30.7 g/dL (ref 30.0–36.0)
MCV: 87 fL (ref 80.0–100.0)
Monocytes Absolute: 0.7 10*3/uL (ref 0.1–1.0)
Monocytes Relative: 6 %
Neutro Abs: 7.5 10*3/uL (ref 1.7–7.7)
Neutrophils Relative %: 63 %
Platelets: 329 10*3/uL (ref 150–400)
RBC: 4.01 MIL/uL (ref 3.87–5.11)
RDW: 14.7 % (ref 11.5–15.5)
WBC: 11.8 10*3/uL — ABNORMAL HIGH (ref 4.0–10.5)
nRBC: 0 % (ref 0.0–0.2)

## 2022-09-21 LAB — BASIC METABOLIC PANEL
Anion gap: 8 (ref 5–15)
BUN: 16 mg/dL (ref 6–20)
CO2: 28 mmol/L (ref 22–32)
Calcium: 9.3 mg/dL (ref 8.9–10.3)
Chloride: 101 mmol/L (ref 98–111)
Creatinine, Ser: 0.82 mg/dL (ref 0.44–1.00)
GFR, Estimated: >60 mL/min (ref >60)
Glucose, Bld: 252 mg/dL — ABNORMAL HIGH (ref 70–99)
Potassium: 3.8 mmol/L (ref 3.5–5.1)
Sodium: 137 mmol/L (ref 135–145)

## 2022-09-21 LAB — TROPONIN I (HIGH SENSITIVITY)
Troponin I (High Sensitivity): <2 ng/L (ref <18)
Troponin I (High Sensitivity): <2 ng/L (ref <18)

## 2022-09-21 MED ORDER — KETOROLAC TROMETHAMINE 30 MG/ML IJ SOLN
30.0000 mg | Freq: Once | INTRAMUSCULAR | Status: AC
  Administered 2022-09-21: 30 mg via INTRAVENOUS
  Filled 2022-09-21: qty 1

## 2022-09-21 NOTE — ED Triage Notes (Signed)
Pt c/o left sided chest pain since 2300 last night with [facility].

## 2022-09-21 NOTE — Discharge Instructions (Signed)
You were seen today for pain.  Your exam is most consistent with chest wall pain.  Your workup is reassuring.  Follow-up with your primary physician.  Take ibuprofen as needed for any ongoing pain.

## 2022-09-21 NOTE — ED Provider Notes (Signed)
[facility] AT [facility] Provider Note   CSN: [id number] Arrival date & time: 09/21/22  0103     History  Chief Complaint  Patient presents with   Chest Pain    Rita Lamb is a 47 y.o. female.  HPI     This is a 47 year old female who presents with chest pain.  Patient reports onset of symptoms around 11 PM last night.  She states she frequently will get gas but the belching and or passing gas will make it better.  Tonight however, she developed some left-sided chest discomfort that was similar nature but did not improve with belching.  She has felt somewhat short of breath.  No fevers or cough.  Denies any pain with eating.  She has a history of hypertension and diabetes.  No known heart disease.  Home Medications Prior to Admission medications   Medication Sig Start Date End Date Taking? Authorizing Provider  albuterol (VENTOLIN HFA) 108 (90 Base) MCG/ACT inhaler Inhale 2 puffs into the lungs every 4 (four) hours as needed for wheezing or shortness of breath. Patient not taking: Reported on 04/20/2022 12/12/20   Roxy Horseman, PA-C  hydrochlorothiazide (HYDRODIURIL) 25 MG tablet Take 25 mg by mouth daily.    [provider]  HYDROcodone-acetaminophen (NORCO/VICODIN) 5-325 MG tablet Take 2 tablets by mouth every 6 (six) hours as needed for severe pain. 04/20/22   Karie Mainland, Amjad, PA-C  lisinopril (ZESTRIL) 10 MG tablet Take 10 mg by mouth daily. 06/28/19   [provider]  meclizine (ANTIVERT) 25 MG tablet Take 1 tablet (25 mg total) by mouth 3 (three) times daily as needed for dizziness. Patient not taking: Reported on 04/20/2022 06/14/20   Roxy Horseman, PA-C  metFORMIN (GLUCOPHAGE) 1000 MG tablet Take 1 tablet (1,000 mg total) by mouth 2 (two) times daily. 04/20/22   Karie Mainland, Amjad, PA-C  ondansetron (ZOFRAN ODT) 4 MG disintegrating tablet Take 1 tablet (4 mg total) by mouth every 8 (eight) hours as needed for nausea or  vomiting. Patient not taking: Reported on 04/20/2022 06/14/20   Roxy Horseman, PA-C  predniSONE (DELTASONE) 20 MG tablet Take 20 mg by mouth See admin instructions. Take 3 tablets x 3 days and then 2 tablets x 2 days 04/15/22   [provider]  spironolactone (ALDACTONE) 25 MG tablet Take 25 mg by mouth daily.    [provider]      Allergies    Latex and Sulfa antibiotics    Review of Systems   Review of Systems  Constitutional:  Negative for fever.  Cardiovascular:  Positive for chest pain.  Gastrointestinal:  Negative for abdominal pain.  All other systems reviewed and are negative.   Physical Exam Updated Vital Signs BP (!) 158/79   Pulse 66   Temp 97.8 F (36.6 C)   Resp 15   Ht 1.6 m (5\' 3" )   Wt 113.4 kg   SpO2 96%   BMI 44.29 kg/m  Physical Exam Vitals and nursing note reviewed.  Constitutional:      Appearance: She is well-developed. She is obese. She is not ill-appearing.  HENT:     Head: Normocephalic and atraumatic.  Eyes:     Pupils: Pupils are equal, round, and reactive to light.  Cardiovascular:     Rate and Rhythm: Normal rate and regular rhythm.     Heart sounds: Normal heart sounds.  Pulmonary:     Effort: Pulmonary effort is normal. No respiratory distress.  Breath sounds: No wheezing.  Abdominal:     General: Bowel sounds are normal.     Palpations: Abdomen is soft.     Tenderness: There is abdominal tenderness.  Musculoskeletal:     Cervical back: Neck supple.     Right lower leg: No edema.     Left lower leg: No edema.  Skin:    General: Skin is warm and dry.  Neurological:     Mental Status: She is alert and oriented to person, place, and time.  Psychiatric:        Mood and Affect: Mood normal.     ED Results / Procedures / Treatments   Labs (all labs ordered are listed, but only abnormal results are displayed) Labs Reviewed  CBC WITH DIFFERENTIAL/PLATELET - Abnormal; Notable for the following components:       Result Value   WBC 11.8 (*)    Hemoglobin 10.7 (*)    HCT 34.9 (*)    All other components within normal limits  BASIC METABOLIC PANEL - Abnormal; Notable for the following components:   Glucose, Bld 252 (*)    All other components within normal limits  TROPONIN I (HIGH SENSITIVITY)  TROPONIN I (HIGH SENSITIVITY)    EKG EKG Interpretation  Date/Time:  Wednesday Sep 21 2022 01:11:05 EDT Ventricular Rate:  68 PR Interval:  160 QRS Duration: 108 QT Interval:  383 QTC Calculation: 408 R Axis:   19 Text Interpretation: Sinus rhythm Confirmed by Ross Marcus ([id number]) on 09/21/2022 1:12:59 AM  Radiology DG Chest Portable 1 View  Result Date: 09/21/2022 CLINICAL DATA:  Chest pain EXAM: PORTABLE CHEST 1 VIEW COMPARISON:  12/11/2020 FINDINGS: Heart and mediastinal contours are within normal limits. No focal opacities or effusions. No acute bony abnormality. Mild peribronchial thickening. IMPRESSION: Mild bronchitic changes. Electronically Signed   By: Charlett Nose M.D.   On: 09/21/2022 01:35    Procedures Procedures    Medications Ordered in ED Medications  ketorolac (TORADOL) 30 MG/ML injection 30 mg (30 mg Intravenous Given 09/21/22 0140)    ED Course/ Medical Decision Making/ A&P                             Medical Decision Making Amount and/or Complexity of Data Reviewed Labs: ordered. Radiology: ordered.  Risk Prescription drug management.   This patient presents to the ED for concern of chest pain, this involves an extensive number of treatment options, and is a complaint that carries with it a high risk of complications and morbidity.  I considered the following differential and admission for this acute, potentially life threatening condition.  The differential diagnosis includes ACS, PE, pneumothorax, pneumonia, musculoskeletal pain, GI etiology  MDM:    This is a 47 year old female who presents with chest pain.  She is nontoxic and vital signs are reassuring.   EKG is normal.  She is PE RC negative which effectively rules out PE.  She has reproducible chest wall tenderness.  Troponin x 2 negative.  Doubt primary ACS.  Chest x-ray without pneumothorax or pneumonia.  Patient had significant improvement of her symptoms with Toradol.  Recommend a trial of NSAIDs.  (Labs, imaging, consults)  Labs: I Ordered, and personally interpreted labs.  The pertinent results include: CBC, BMP, troponin x 2  Imaging Studies ordered: I ordered imaging studies including chest x-ray I independently visualized and interpreted imaging. I agree with the radiologist interpretation  Additional history obtained from chart  review.  External records from outside source obtained and reviewed including prior evaluations  Cardiac Monitoring: The patient was maintained on a cardiac monitor.  If on the cardiac monitor, I personally viewed and interpreted the cardiac monitored which showed an underlying rhythm of: Sinus rhythm  Reevaluation: After the interventions noted above, I reevaluated the patient and found that they have :improved  Social Determinants of Health:  lives independently  Disposition: Discharge  Co morbidities that complicate the patient evaluation  Past Medical History:  Diagnosis Date   Arthritis    bilateral knee   Asthma    Bell's palsy    Chronic hypertension complicating or reason for care during pregnancy 07/23/2015   Guidelines for Antenatal Testing and Sonography  (Revised 07/2012)  INDICATION U/S NST/BPP DELIVERY  CHTN - 642.03   Group I   BP < 140/90, no PIH, AGA,  nml AFV, +/- meds     Group II   BP > 140/90, on meds, no PIH, AGA, nml AFV  [ ]  20-[ ]  28- [ ] 34- [ ] 38  20-24-28-31-34-37  32//2 x wk  28//BPP wkly then 32//2 x wk  40 no meds; 39 meds  FLM or 39  Previous Stillbirth (> 28 wks) - V23.5 20-24-28-32-36 28//BPP wkly then 32//2 x wk 39   Baseline Labs:   AST/ALT 8/8 24 protein:  191     Helicobacter pylori (H. pylori) infection     Hernia, umbilical    Hypertension    PCOS (polycystic ovarian syndrome)      Medicines Meds ordered this encounter  Medications   ketorolac (TORADOL) 30 MG/ML injection 30 mg    I have reviewed the patients home medicines and have made adjustments as needed  Problem List / ED Course: Problem List Items Addressed This Visit   None Visit Diagnoses     Atypical chest pain    -  Primary                   Final Clinical Impression(s) / ED Diagnoses Final diagnoses:  Atypical chest pain    Rx / DC Orders ED Discharge Orders     None         Shon Baton, MD 09/21/22 [phone]
